# Patient Record
Sex: Female | Born: 1945 | Race: White | Hispanic: No | Marital: Married | Smoking: Former smoker
Health system: Southern US, Community
[De-identification: ages and names within clinical notes are randomized; demographics above are authoritative.]

## PROBLEM LIST (undated history)

## (undated) DIAGNOSIS — E1140 Type 2 diabetes mellitus with diabetic neuropathy, unspecified: Secondary | ICD-10-CM

## (undated) DIAGNOSIS — I4891 Unspecified atrial fibrillation: Secondary | ICD-10-CM

## (undated) DIAGNOSIS — E119 Type 2 diabetes mellitus without complications: Secondary | ICD-10-CM

## (undated) DIAGNOSIS — I10 Essential (primary) hypertension: Secondary | ICD-10-CM

## (undated) DIAGNOSIS — M1990 Unspecified osteoarthritis, unspecified site: Secondary | ICD-10-CM

## (undated) DIAGNOSIS — Z8619 Personal history of other infectious and parasitic diseases: Secondary | ICD-10-CM

## (undated) DIAGNOSIS — B351 Tinea unguium: Secondary | ICD-10-CM

## (undated) DIAGNOSIS — E785 Hyperlipidemia, unspecified: Secondary | ICD-10-CM

## (undated) DIAGNOSIS — J309 Allergic rhinitis, unspecified: Secondary | ICD-10-CM

## (undated) DIAGNOSIS — Z8679 Personal history of other diseases of the circulatory system: Secondary | ICD-10-CM

## (undated) DIAGNOSIS — M109 Gout, unspecified: Secondary | ICD-10-CM

## (undated) DIAGNOSIS — S42409A Unspecified fracture of lower end of unspecified humerus, initial encounter for closed fracture: Secondary | ICD-10-CM

## (undated) DIAGNOSIS — T884XXA Failed or difficult intubation, initial encounter: Secondary | ICD-10-CM

## (undated) DIAGNOSIS — M8700 Idiopathic aseptic necrosis of unspecified bone: Secondary | ICD-10-CM

## (undated) DIAGNOSIS — M810 Age-related osteoporosis without current pathological fracture: Secondary | ICD-10-CM

## (undated) DIAGNOSIS — L859 Epidermal thickening, unspecified: Secondary | ICD-10-CM

## (undated) DIAGNOSIS — I479 Paroxysmal tachycardia, unspecified: Secondary | ICD-10-CM

## (undated) DIAGNOSIS — R011 Cardiac murmur, unspecified: Secondary | ICD-10-CM

## (undated) DIAGNOSIS — Z9181 History of falling: Secondary | ICD-10-CM

## (undated) DIAGNOSIS — J45909 Unspecified asthma, uncomplicated: Secondary | ICD-10-CM

## (undated) DIAGNOSIS — I359 Nonrheumatic aortic valve disorder, unspecified: Secondary | ICD-10-CM

## (undated) DIAGNOSIS — I499 Cardiac arrhythmia, unspecified: Secondary | ICD-10-CM

## (undated) DIAGNOSIS — E0590 Thyrotoxicosis, unspecified without thyrotoxic crisis or storm: Secondary | ICD-10-CM

## (undated) DIAGNOSIS — E039 Hypothyroidism, unspecified: Secondary | ICD-10-CM

## (undated) DIAGNOSIS — S72409A Unspecified fracture of lower end of unspecified femur, initial encounter for closed fracture: Secondary | ICD-10-CM

## (undated) DIAGNOSIS — K219 Gastro-esophageal reflux disease without esophagitis: Secondary | ICD-10-CM

## (undated) HISTORY — DX: Cardiac murmur, unspecified: R01.1

## (undated) HISTORY — DX: Cardiac arrhythmia, unspecified: I49.9

## (undated) HISTORY — PX: TOTAL KNEE ARTHROPLASTY: SHX125

## (undated) HISTORY — PX: ORIF FIBULA FRACTURE: SHX2121

## (undated) HISTORY — DX: Gout, unspecified: M10.9

## (undated) HISTORY — DX: Paroxysmal tachycardia, unspecified: I47.9

## (undated) HISTORY — PX: REPLACEMENT TOTAL KNEE BILATERAL: SUR1225

## (undated) HISTORY — DX: Unspecified fracture of lower end of unspecified femur, initial encounter for closed fracture: S72.409A

## (undated) HISTORY — DX: Personal history of other infectious and parasitic diseases: Z86.19

## (undated) HISTORY — PX: CHOLECYSTECTOMY: SHX55

## (undated) HISTORY — DX: History of falling: Z91.81

## (undated) HISTORY — DX: Gastro-esophageal reflux disease without esophagitis: K21.9

## (undated) HISTORY — DX: Failed or difficult intubation, initial encounter: T88.4XXA

## (undated) HISTORY — DX: Tinea unguium: B35.1

## (undated) HISTORY — DX: Hypothyroidism, unspecified: E03.9

## (undated) HISTORY — DX: Unspecified fracture of lower end of unspecified humerus, initial encounter for closed fracture: S42.409A

## (undated) HISTORY — DX: Type 2 diabetes mellitus without complications: E11.9

## (undated) HISTORY — DX: Type 2 diabetes mellitus with diabetic neuropathy, unspecified: E11.40

## (undated) HISTORY — DX: Unspecified osteoarthritis, unspecified site: M19.90

## (undated) HISTORY — DX: Unspecified asthma, uncomplicated: J45.909

## (undated) HISTORY — DX: Personal history of other diseases of the circulatory system: Z86.79

## (undated) HISTORY — DX: Age-related osteoporosis without current pathological fracture: M81.0

## (undated) HISTORY — DX: Epidermal thickening, unspecified: L85.9

---

## 1973-02-12 HISTORY — PX: THYROIDECTOMY: SHX17

## 1981-02-12 HISTORY — PX: TOTAL ABDOMINAL HYSTERECTOMY W/ BILATERAL SALPINGOOPHORECTOMY: SHX83

## 2005-02-12 HISTORY — PX: JOINT REPLACEMENT: SHX530

## 2008-02-13 HISTORY — PX: FEMUR FRACTURE SURGERY: SHX633

## 2008-07-15 ENCOUNTER — Ambulatory Visit: Payer: Self-pay | Admitting: Internal Medicine

## 2008-07-25 ENCOUNTER — Other Ambulatory Visit: Payer: Self-pay | Admitting: Emergency Medicine

## 2009-10-30 ENCOUNTER — Emergency Department: Payer: Self-pay | Admitting: Unknown Physician Specialty

## 2009-11-01 ENCOUNTER — Ambulatory Visit: Payer: Self-pay | Admitting: Family Medicine

## 2010-03-13 ENCOUNTER — Ambulatory Visit: Payer: Self-pay | Admitting: Internal Medicine

## 2012-01-04 ENCOUNTER — Encounter: Payer: Self-pay | Admitting: Nurse Practitioner

## 2012-01-04 ENCOUNTER — Encounter: Payer: Self-pay | Admitting: Cardiothoracic Surgery

## 2012-01-13 ENCOUNTER — Encounter: Payer: Self-pay | Admitting: Nurse Practitioner

## 2012-01-13 ENCOUNTER — Encounter: Payer: Self-pay | Admitting: Cardiothoracic Surgery

## 2012-05-21 ENCOUNTER — Emergency Department: Payer: Self-pay | Admitting: Emergency Medicine

## 2012-05-27 ENCOUNTER — Ambulatory Visit: Payer: Self-pay | Admitting: General Practice

## 2012-05-27 LAB — BASIC METABOLIC PANEL
BUN: 13 mg/dL (ref 7–18)
Calcium, Total: 9.3 mg/dL (ref 8.5–10.1)
Chloride: 102 mmol/L (ref 98–107)
EGFR (Non-African Amer.): >60
Glucose: 140 mg/dL — ABNORMAL HIGH (ref 65–99)

## 2012-05-27 LAB — CBC
MCHC: 33.7 g/dL (ref 32.0–36.0)
MCV: 95 fL (ref 80–100)
Platelet: 188 10*3/uL (ref 150–440)
RDW: 12.4 % (ref 11.5–14.5)

## 2012-05-28 ENCOUNTER — Ambulatory Visit: Payer: Self-pay | Admitting: General Practice

## 2012-05-28 HISTORY — PX: ORIF HUMERUS FRACTURE: SHX2126

## 2014-03-22 DIAGNOSIS — E119 Type 2 diabetes mellitus without complications: Secondary | ICD-10-CM | POA: Insufficient documentation

## 2014-06-04 NOTE — Op Note (Signed)
PATIENT NAME:  Bethany Joseph, [name] MR#:  [id number] DATE OF BIRTH:  [birth year]  DATE OF PROCEDURE:  05/28/2012  PREOPERATIVE DIAGNOSIS: Fracture of the left distal humerus (supracondylar).   POSTOPERATIVE DIAGNOSIS: Fracture of the left distal humerus (supracondylar).   PROCEDURE PERFORMED: Open reduction and internal fixation of left distal humerus fracture.   SURGEON: Illene LabradorJames P. Angie FavaHooten, Jr., MD   ANESTHESIA: General.   ESTIMATED BLOOD LOSS: 50 mL.   FLUIDS REPLACED: 1500 mL of crystalloid.   DRAINS: None.   TOURNIQUET TIME:  1. 120 minutes.  2.  58 minutes.   IMPLANTS UTILIZED: Synthes 5-hole 3.5 mm LCP distal humerus plate, 3-hole medial 3.5 mm LCP distal humerus plate, six  2.7 mm locking screws, four 3.5 mm locking screws and three 3.5 mm cortex screws.   INDICATIONS FOR SURGERY: The patient is a 69 year old female who slipped and fell at home landing on her left arm and elbow. Radiographs demonstrated an extra-articular supracondylar distal humerus fracture. After discussion of the risks and benefits of surgical intervention, the patient expressed understanding of the risks and benefits and agreed with plans for surgical intervention. 1  PROCEDURE IN DETAIL: The patient was brought into the Operating Room, and after adequate general anesthesia was achieved, the patient was placed in a right lateral decubitus position. An axillary roll was placed, and all bony prominences were well padded. A beanbag  was suctioned so as to provide firm support for the patient. The patient's left arm was cleaned and prepped with alcohol and DuraPrep and draped in the usual sterile fashion. A "timeout" was performed as per usual protocol. The left upper extremity was exsanguinated using an Esmarch, and the tourniquet was inflated to 250 mmHg.  A posterior longitudinal incision was made extending just lateral to the olecranon process. Full-thickness subcutaneous flaps were developed both medially and  laterally. Dissection was carried down along the lateral aspect of the triceps and a para-triceps approach was utilized laterally by elevating triceps off of the posterior cortex. Next, careful dissection was carried out so as to mobilize the ulnar nerve and transpose the nerve. The medial intermuscular septum was incised so as to visualize the medial cortex extending down to the medial condyle. Provisional reduction was performed and maintained using 2 K-wires. Position was confirmed in multiple planes using FluoroScan. A 5-hole 3.5 mm posterolateral LCP plate was positioned and maintained in position using a 3.5 mm screw through the slotted Combi-Hole. Good position was noted. Next, distal fixation was achieved using three 2.7 mm locking screws. Good position was noted in multiple planes using FluoroScan. The proximal portion of the plate was then further stabilized using an additional 3.5 mm cortical screw and 2 additional 3.5 mm locking screws. Next, a 3- hole medial 3.5 mm LCP distal humeral plate was positioned provisionally. At approximately this time the tourniquet was deflated after an initial tourniquet time of 120 minutes. Good hemostasis was appreciated. The medial plate was positioned and a 3.5 mm cortical screw was inserted through the slotted Combi-Hole to maintain provisional fixation. Good position was noted. Distal fixation of the plate was performed using three 2.7 mm locking screws. The tourniquet was reinflated after approximately 30 minutes of downtime. The proximal portion of the medial plate was then further secured using two additional 3.5 mm locking screws. The elbow was placed through a range of motion with good stability appreciated and a good range of motion both with flexion and extension. The wound was irrigated with copious amounts of normal saline  with antibiotic solution using a bulb syringe. The tourniquet was deflated after a second tourniquet time of 15 minutes. Good hemostasis  was noted. A fascial closure over the medial plate prevented contact of the ulnar nerve with the metal plate. The medial and lateral paratricipital incisions were loosely reapproximated using #1 Vicryl. Next, subcutaneous tissue was approximated in layers using first #0 Vicryl, followed #2-0 Vicryl. Skin was closed with skin staples. A sterile dressing was applied after injection of 30 mL of 0.25% Marcaine. A posterior splint was applied. The patient tolerated the procedure well. She was transported to the recovery room in stable condition.  ____________________________ Illene Labrador. Angie Fava., MD jph:cb D: 05/28/2012 19:35:20 ET T: 05/28/2012 20:57:37 ET JOB#: [id number] cc: Fayrene Fearing P. Angie Fava., MD, <Dictator> Illene Labrador Angie Fava MD ELECTRONICALLY SIGNED 05/30/2012 12:37

## 2016-01-25 ENCOUNTER — Ambulatory Visit
Admission: EM | Admit: 2016-01-25 | Discharge: 2016-01-25 | Disposition: A | Discharge status: Home / Self Care | Payer: Medicare Other | Attending: Family Medicine | Admitting: Family Medicine

## 2016-01-25 DIAGNOSIS — L03116 Cellulitis of left lower limb: Secondary | ICD-10-CM

## 2016-01-25 DIAGNOSIS — L97929 Non-pressure chronic ulcer of unspecified part of left lower leg with unspecified severity: Secondary | ICD-10-CM

## 2016-01-25 DIAGNOSIS — I83029 Varicose veins of left lower extremity with ulcer of unspecified site: Secondary | ICD-10-CM

## 2016-01-25 HISTORY — DX: Essential (primary) hypertension: I10

## 2016-01-25 HISTORY — DX: Allergic rhinitis, unspecified: J30.9

## 2016-01-25 HISTORY — DX: Thyrotoxicosis, unspecified without thyrotoxic crisis or storm: E05.90

## 2016-01-25 HISTORY — DX: Nonrheumatic aortic valve disorder, unspecified: I35.9

## 2016-01-25 HISTORY — DX: Idiopathic aseptic necrosis of unspecified bone: M87.00

## 2016-01-25 HISTORY — DX: Unspecified asthma, uncomplicated: J45.909

## 2016-01-25 HISTORY — DX: Hyperlipidemia, unspecified: E78.5

## 2016-01-25 HISTORY — DX: Type 2 diabetes mellitus without complications: E11.9

## 2016-01-25 MED ORDER — CLINDAMYCIN HCL 300 MG PO CAPS
300.0000 mg | ORAL_CAPSULE | Freq: Three times a day (TID) | ORAL | 0 refills | Status: DC
Start: 2016-01-25 — End: 2016-07-19

## 2016-01-25 MED ORDER — SILVER SULFADIAZINE 1 % EX CREA: 1.0000 "application " | TOPICAL_CREAM | Freq: Two times a day (BID) | CUTANEOUS | 0 refills | Status: DC

## 2016-01-25 NOTE — ED Provider Notes (Signed)
[facility]    CSN: [id number] Arrival date & time: 01/25/16  1658     History   Chief Complaint Chief Complaint  Patient presents with  . Skin Ulcer    HPI Bethany Joseph is a 70 y.o. female.   70 yo diabetic female with a c/o 2 weeks h/o left lower leg skin lesion which has been progressively with surrounding erythema, edema and tenderness to palpation. Patient denies any trauma, injury, fevers, chills. States area has had some mild drainage recently.    The history is provided by the patient.    Past Medical History:  Diagnosis Date  . Allergic rhinitis   . Aortic valve disorder   . Asthma   . Avascular necrosis (HCC)   . Diabetes (HCC)   . Hyperlipidemia   . Hypertension   . Hyperthyroidism     There are no active problems to display for this patient.   Past Surgical History:  Procedure Laterality Date  . ABDOMINAL HYSTERECTOMY    . CHOLECYSTECTOMY    . ORIF FIBULA FRACTURE    . ORIF HUMERUS FRACTURE    . REPLACEMENT TOTAL KNEE BILATERAL      OB History    No data available       Home Medications    Prior to Admission medications   Medication Sig Start Date End Date Taking? Authorizing Provider  aspirin 325 MG tablet Take 325 mg by mouth daily.   Yes Historical Provider, MD  cholecalciferol (VITAMIN D) 400 units TABS tablet Take 400 Units by mouth.   Yes Historical Provider, MD  digoxin (LANOXIN) 0.125 MG tablet Take 0.125 mg by mouth daily.   Yes Historical Provider, MD  furosemide (LASIX) 20 MG tablet Take 20 mg by mouth.   Yes Historical Provider, MD  HYDROcodone-acetaminophen (NORCO/VICODIN) 5-325 MG tablet Take 1 tablet by mouth every 6 (six) hours as needed for moderate pain.   Yes Historical Provider, MD  levothyroxine (SYNTHROID, LEVOTHROID) 25 MCG tablet Take 25 mcg by mouth daily before breakfast.   Yes Historical Provider, MD  liothyronine (CYTOMEL) 25 MCG tablet Take by mouth daily.   Yes Historical Provider, MD    metFORMIN (GLUCOPHAGE) 500 MG tablet Take by mouth 2 (two) times daily with a meal.   Yes Historical Provider, MD  vitamin E 1000 UNIT capsule Take 1,000 Units by mouth daily.   Yes Historical Provider, MD  zolpidem (AMBIEN) 10 MG tablet Take 10 mg by mouth at bedtime as needed for sleep.   Yes Historical Provider, MD  clindamycin (CLEOCIN) 300 MG capsule Take 1 capsule (300 mg total) by mouth 3 (three) times daily. 01/25/16   Payton Mccallumrlando Amayiah Gosnell, MD  silver sulfADIAZINE (SILVADENE) 1 % cream Apply 1 application topically 2 (two) times daily. 01/25/16   Payton Mccallumrlando Ebonie Westerlund, MD    Family History History reviewed. No pertinent family history.  Social History Social History  Substance Use Topics  . Smoking status: Never Smoker  . Smokeless tobacco: Never Used  . Alcohol use Yes     Allergies   Codeine; Latex; Neosporin [neomycin-polymyxin-gramicidin]; and Penicillins   Review of Systems Review of Systems   Physical Exam Triage Vital Signs ED Triage Vitals  Enc Vitals Group     BP 01/25/16 1716 (!) 158/72     Pulse Rate 01/25/16 1716 64     Resp 01/25/16 1716 17     Temp 01/25/16 1716 98.2 F (36.8 C)     Temp Source 01/25/16 1716 Oral  SpO2 01/25/16 1716 97 %     Weight 01/25/16 1718 180 lb (81.6 kg)     Height 01/25/16 1718 5\' 7"  (1.702 m)     Head Circumference --      Peak Flow --      Pain Score 01/25/16 1725 4     Pain Loc --      Pain Edu? --      Excl. in GC? --    No data found.   Updated Vital Signs BP (!) 158/72 (BP Location: Left Arm)   Pulse 64   Temp 98.2 F (36.8 C) (Oral)   Resp 17   Ht 5\' 7"  (1.702 m)   Wt 180 lb (81.6 kg)   SpO2 97%   BMI 28.19 kg/m   Visual Acuity Right Eye Distance:   Left Eye Distance:   Bilateral Distance:    Right Eye Near:   Left Eye Near:    Bilateral Near:     Physical Exam  Constitutional: She appears well-developed and well-nourished. No distress.  Skin: She is not diaphoretic.     1cm x 1.5cm superficial  skin ulceration to lateral  left lower extremity (above the ankle) with no drainage, but with surrounding blanchable erythema and tenderness to palpation  Nursing note and vitals reviewed.    UC Treatments / Results  Labs (all labs ordered are listed, but only abnormal results are displayed) Labs Reviewed - No data to display  EKG  EKG Interpretation None       Radiology No results found.  Procedures Procedures (including critical care time)  Medications Ordered in UC Medications - No data to display   Initial Impression / Assessment and Plan / UC Course  I have reviewed the triage vital signs and the nursing notes.  Pertinent labs & imaging results that were available during my care of the patient were reviewed by me and considered in my medical decision making (see chart for details).  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 diagnosis reviewed with patient 2. rx as per orders above; reviewed possible side effects, interactions, risks and benefits  3. Follow-up with PCP within the next 7 days or sooner here (or PCP) prn if symptoms worsen or don't improve   Payton Mccallumrlando Abdullahi Vallone, MD 01/25/16 2144

## 2016-01-25 NOTE — ED Triage Notes (Signed)
Patient has an ulcerated area on her left ankle. Patient states that lesion came up around 2 weeks ago but has recently worsened. Patient states that she saw Dr. Ether GriffinsFowler two weeks ago and she has been using neosporin and elevation. Patient states that the swelling has worsened and this is what has concerned her.

## 2016-03-26 ENCOUNTER — Other Ambulatory Visit (INDEPENDENT_AMBULATORY_CARE_PROVIDER_SITE_OTHER): Payer: Medicare Other

## 2016-03-26 ENCOUNTER — Ambulatory Visit (INDEPENDENT_AMBULATORY_CARE_PROVIDER_SITE_OTHER): Payer: Medicare Other | Admitting: Vascular Surgery

## 2016-03-26 ENCOUNTER — Encounter (INDEPENDENT_AMBULATORY_CARE_PROVIDER_SITE_OTHER): Payer: Self-pay | Admitting: Vascular Surgery

## 2016-03-26 ENCOUNTER — Other Ambulatory Visit (INDEPENDENT_AMBULATORY_CARE_PROVIDER_SITE_OTHER): Payer: Self-pay | Admitting: Vascular Surgery

## 2016-03-26 DIAGNOSIS — I83029 Varicose veins of left lower extremity with ulcer of unspecified site: Secondary | ICD-10-CM

## 2016-03-26 DIAGNOSIS — L97929 Non-pressure chronic ulcer of unspecified part of left lower leg with unspecified severity: Principal | ICD-10-CM

## 2016-03-26 DIAGNOSIS — I872 Venous insufficiency (chronic) (peripheral): Secondary | ICD-10-CM | POA: Insufficient documentation

## 2016-03-26 DIAGNOSIS — L97921 Non-pressure chronic ulcer of unspecified part of left lower leg limited to breakdown of skin: Secondary | ICD-10-CM

## 2016-03-26 DIAGNOSIS — J439 Emphysema, unspecified: Secondary | ICD-10-CM

## 2016-03-26 DIAGNOSIS — E11622 Type 2 diabetes mellitus with other skin ulcer: Secondary | ICD-10-CM

## 2016-03-26 DIAGNOSIS — I83009 Varicose veins of unspecified lower extremity with ulcer of unspecified site: Secondary | ICD-10-CM | POA: Insufficient documentation

## 2016-03-26 DIAGNOSIS — J449 Chronic obstructive pulmonary disease, unspecified: Secondary | ICD-10-CM | POA: Insufficient documentation

## 2016-03-26 DIAGNOSIS — L97909 Non-pressure chronic ulcer of unspecified part of unspecified lower leg with unspecified severity: Secondary | ICD-10-CM

## 2016-03-26 DIAGNOSIS — E119 Type 2 diabetes mellitus without complications: Secondary | ICD-10-CM | POA: Insufficient documentation

## 2016-03-26 DIAGNOSIS — I10 Essential (primary) hypertension: Secondary | ICD-10-CM | POA: Insufficient documentation

## 2016-03-26 NOTE — Progress Notes (Signed)
MRN : [id number]  Bethany Joseph is a 71 y.o. (May 26, 1945) female who presents with chief complaint of  Chief Complaint  Patient presents with  . New Patient (Initial Visit)  .  History of Present Illness: Patient is seen for evaluation of leg pain and swelling associated with left ankle ulceration. The patient first noticed the swelling remotely. The swelling is associated with pain and discoloration. The pain and swelling worsens with prolonged dependency and improves with elevation. The pain is unrelated to activity.  The patient notes that in the morning the legs are better but the leg symptoms worsened throughout the course of the day. The patient has also noted a progressive worsening of the discoloration in the ankle and shin area.   The patient notes that an ulcer has developed acutely without specific trauma and since it occurred it has been very slow to heal.  There is a moderate amount of drainage associated with the open area.  The wound is also very painful.  The patient denies claudication symptoms or rest pain symptoms.  The patient denies DJD and LS spine disease.  The patient has not had any past angiography, interventions or vascular surgery.  Elevation makes the leg symptoms better, dependency makes them much worse. The patient denies any recent changes in medications.  The patient has not been wearing graduated compression.  The patient denies a history of DVT or PE. There is no prior history of phlebitis. There is no history of primary lymphedema.  No history of malignancies. No history of trauma or groin or pelvic surgery. There is no history of radiation treatment to the groin or pelvis   Venous ultrasound of the left leg shows reflux in the small saphenous vein    Current Meds  Medication Sig  . albuterol (PROAIR HFA) 108 (90 Base) MCG/ACT inhaler USE 2 PUFFS TWICE DAILY AS NEEDED  . aspirin 325 MG tablet Take 325 mg by mouth daily.  . Blood Glucose  Monitoring Suppl (GLUCOCOM BLOOD GLUCOSE MONITOR) DEVI Test daily before all meals/snacks and once before bedtime.  . cholecalciferol (VITAMIN D) 400 units TABS tablet Take 400 Units by mouth.  . digoxin (LANOXIN) 0.125 MG tablet Take 0.125 mg by mouth daily.  . furosemide (LASIX) 20 MG tablet Take 20 mg by mouth.  Marland Kitchen HYDROcodone-acetaminophen (NORCO/VICODIN) 5-325 MG tablet Take 1 tablet by mouth every 6 (six) hours as needed for moderate pain.  . Lancets (FREESTYLE) lancets Test daily before all meals/snacks and once before bedtime.  Marland Kitchen levothyroxine (SYNTHROID, LEVOTHROID) 25 MCG tablet Take 25 mcg by mouth daily before breakfast.  . liothyronine (CYTOMEL) 25 MCG tablet Take by mouth daily.  . metFORMIN (GLUCOPHAGE) 500 MG tablet Take by mouth 2 (two) times daily with a meal.  . omeprazole (PRILOSEC) 40 MG capsule TAKE ONE CAPSULE BY MOUTH DAILY  . pravastatin (PRAVACHOL) 20 MG tablet Take 20 mg by mouth.  . raloxifene (EVISTA) 60 MG tablet TAKE ONE (1) TABLET BY MOUTH EVERY DAY  . silver sulfADIAZINE (SILVADENE) 1 % cream Apply 1 application topically 2 (two) times daily.  . vitamin E 1000 UNIT capsule Take 1,000 Units by mouth daily.  Marland Kitchen zolpidem (AMBIEN) 10 MG tablet Take 10 mg by mouth at bedtime as needed for sleep.    Past Medical History:  Diagnosis Date  . Allergic rhinitis   . Aortic valve disorder   . Asthma   . Avascular necrosis (HCC)   . Diabetes (HCC)   . Hyperlipidemia   .  Hypertension   . Hyperthyroidism     Past Surgical History:  Procedure Laterality Date  . ABDOMINAL HYSTERECTOMY    . CHOLECYSTECTOMY    . ORIF FIBULA FRACTURE    . ORIF HUMERUS FRACTURE    . REPLACEMENT TOTAL KNEE BILATERAL      Social History Social History  Substance Use Topics  . Smoking status: Never Smoker  . Smokeless tobacco: Never Used  . Alcohol use Yes    Family History No family history on file. No family history of bleeding/clotting disorders, porphyria or autoimmune  disease  Allergies  Allergen Reactions  . Codeine   . Latex   . Neosporin [Neomycin-Polymyxin-Gramicidin]   . Penicillins      REVIEW OF SYSTEMS (Negative unless checked)  Constitutional: [] Weight loss  [] Fever  [] Chills Cardiac: [] Chest pain   [] Chest pressure   [] Palpitations   [] Shortness of breath when laying flat   [] Shortness of breath with exertion. Vascular:  [] Pain in legs with walking   [] Pain in legs at rest  [] History of DVT   [] Phlebitis   [x] Swelling in legs   [x] Varicose veins   [x] Non-healing ulcers Pulmonary:   [] Uses home oxygen   [] Productive cough   [] Hemoptysis   [] Wheeze  [] COPD   [] Asthma Neurologic:  [] Dizziness   [] Seizures   [] History of stroke   [] History of TIA  [] Aphasia   [] Vissual changes   [] Weakness or numbness in arm   [] Weakness or numbness in leg Musculoskeletal:   [] Joint swelling   [x] Joint pain   [] Low back pain Hematologic:  [] Easy bruising  [] Easy bleeding   [] Hypercoagulable state   [] Anemic Gastrointestinal:  [] Diarrhea   [] Vomiting  [] Gastroesophageal reflux/heartburn   [] Difficulty swallowing. Genitourinary:  [] Chronic kidney disease   [] Difficult urination  [] Frequent urination   [] Blood in urine Skin:  [] Rashes   [] Ulcers  Psychological:  [] History of anxiety   []  History of major depression.  Physical Examination  Vitals:   03/26/16 1149  BP: (!) 149/68  Pulse: (!) 58  Resp: 16  Weight: 179 lb 12.8 oz (81.6 kg)  Height: 5\' 7"  (1.702 m)   Body mass index is 28.16 kg/m. Gen: WD/WN, NAD Head: [place]/AT, No temporalis wasting.  Ear/Nose/Throat: Hearing grossly intact, nares w/o erythema or drainage, poor dentition Eyes: PER, EOMI, sclera nonicteric.  Neck: Supple, no masses.  No bruit or JVD.  Pulmonary:  Good air movement, clear to auscultation bilaterally, no use of accessory muscles.  Cardiac: RRR, normal S1, S2, no Murmurs. Vascular: left leg with 2+ edema and severe venous stasis dermatitis; ulcer noted left lateral ankle  noninfected moderate granulation present Vessel Right Left  Radial Palpable Palpable  Ulnar Palpable Palpable  Brachial Palpable Palpable  Carotid Palpable Palpable  Femoral Palpable Palpable  Popliteal Palpable Palpable  PT Palpable Palpable  DP Palpable Palpable   Gastrointestinal: soft, non-distended. No guarding/no peritoneal signs.  Musculoskeletal: M/S 5/5 throughout.  No deformity or atrophy.  Neurologic: CN 2-12 intact. Pain and light touch intact in extremities.  Symmetrical.  Speech is fluent. Motor exam as listed above. Psychiatric: Judgment intact, Mood & affect appropriate for pt's clinical situation. Dermatologic: No rashes or ulcers noted.  No changes consistent with cellulitis. Lymph : No Cervical lymphadenopathy, no lichenification or skin changes of chronic lymphedema.  CBC Lab Results  Component Value Date   WBC 5.7 05/27/2012   HGB 13.8 05/27/2012   HCT 40.9 05/27/2012   MCV 95 05/27/2012   PLT 188 05/27/2012  BMET    Component Value Date/Time   NA 136 05/27/2012 0917   K 4.0 05/27/2012 0917   CL 102 05/27/2012 0917   CO2 28 05/27/2012 0917   GLUCOSE 140 (H) 05/27/2012 0917   BUN 13 05/27/2012 0917   CREATININE 0.58 (L) 05/27/2012 0917   CALCIUM 9.3 05/27/2012 0917   GFRNONAA >60 05/27/2012 0917   GFRAA >60 05/27/2012 0917   CrCl cannot be calculated (Patient's most recent lab result is older than the maximum 21 days allowed.).  COAG No results found for: INR, PROTIME  Radiology No results found.  Assessment/Plan 1. 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 Patient will continue  wearing graduated compression stockings class 1 on a daily basis, beginning first thing in the morning and removing them in the evening.    In addition, behavioral modification including elevation during the day was again discussed  and this will continue.  The patient has utilized over the counter pain medications and has been exercising.  However, at this time conservative therapy has not alleviated the patient's symptoms of leg pain and swelling  Recommend: laser ablation of the left small saphenous vein to eliminate the symptoms of pain and swelling of the lower extremities caused by the severe superficial venous reflux disease.   2. Chronic venous insufficiency No surgery or intervention at this point in time.    I have had a long discussion with the patient regarding venous insufficiency and why it  causes symptoms. I have discussed with the patient the chronic skin changes that accompany venous insufficiency and the long term sequela such as infection and ulceration.  Patient will begin wearing graduated compression stockings class 1 (20-30 mmHg) or compression wraps on a daily basis a prescription was given. The patient will put the stockings on first thing in the morning and removing them in the evening. The patient is instructed specifically not to sleep in the stockings.    In addition, behavioral modification including several periods of elevation of the lower extremities during the day will be continued. I have demonstrated that proper elevation is a position with the ankles at heart level.  The patient is instructed to begin routine exercise, especially walking on a daily basis  Following the review of the ultrasound the patient will follow up in 2-3 months to reassess the degree of swelling and the control that graduated compression stockings or compression wraps  is offering.   The patient can be assessed for a Lymph Pump at that time  3. Essential hypertension Continue antihypertensive medications as already ordered, these medications have been reviewed and there are no changes at this time.   4. Type 2 diabetes mellitus with other skin ulcer, without long-term current use of insulin (HCC) Continue  hypoglycemic medications as already ordered, these medications have been reviewed and there are no changes at this time.  Hgb A1C to be monitored as already arranged by primary service   5. Pulmonary emphysema, unspecified emphysema type (HCC) Continue pulmonary medications and aerosols as already ordered, these medications have been reviewed and there are no changes at this time.      Levora Dredge, MD  03/26/2016 9:52 PM

## 2016-04-16 ENCOUNTER — Encounter (INDEPENDENT_AMBULATORY_CARE_PROVIDER_SITE_OTHER): Payer: Self-pay | Admitting: Vascular Surgery

## 2016-04-16 ENCOUNTER — Ambulatory Visit (INDEPENDENT_AMBULATORY_CARE_PROVIDER_SITE_OTHER): Payer: Medicare Other | Admitting: Vascular Surgery

## 2016-04-16 VITALS — BP 161/67 | HR 65 | Resp 16 | Wt 180.0 lb

## 2016-04-16 DIAGNOSIS — I872 Venous insufficiency (chronic) (peripheral): Secondary | ICD-10-CM

## 2016-04-16 DIAGNOSIS — J439 Emphysema, unspecified: Secondary | ICD-10-CM

## 2016-04-16 DIAGNOSIS — I83029 Varicose veins of left lower extremity with ulcer of unspecified site: Secondary | ICD-10-CM | POA: Diagnosis not present

## 2016-04-16 DIAGNOSIS — I10 Essential (primary) hypertension: Secondary | ICD-10-CM

## 2016-04-16 DIAGNOSIS — L97929 Non-pressure chronic ulcer of unspecified part of left lower leg with unspecified severity: Principal | ICD-10-CM

## 2016-04-17 NOTE — Progress Notes (Signed)
MRN : [id number]  Bethany SpinnerKarla Joseph is a 71 y.o. (03/06/1945) female who presents with chief complaint of  Chief Complaint  Patient presents with  . Follow-up  .  History of Present Illness:The patient returns for followup evaluation 3 months after the initial visit. The patient continues to have pain in the lower extremities with dependency. The pain is lessened with elevation. Graduated compression stockings, Class I (20-30 mmHg), have been worn but the stockings do not eliminate the leg pain. Over-the-counter analgesics do not improve the symptoms. The degree of discomfort continues to interfere with daily activities. The patient notes the pain in the legs is causing problems with daily exercise, at the workplace and even with household activities and maintenance such as standing in the kitchen preparing meals and doing dishes.   Venous ultrasound shows normal deep venous system, no evidence of acute or chronic DVT.  Superficial reflux is present in the left small saphenous vein  Current Meds  Medication Sig  . albuterol (PROAIR HFA) 108 (90 Base) MCG/ACT inhaler USE 2 PUFFS TWICE DAILY AS NEEDED  . aspirin 325 MG tablet Take 325 mg by mouth daily.  . Blood Glucose Monitoring Suppl (GLUCOCOM BLOOD GLUCOSE MONITOR) DEVI Test daily before all meals/snacks and once before bedtime.  . cholecalciferol (VITAMIN D) 400 units TABS tablet Take 400 Units by mouth.  . clindamycin (CLEOCIN) 300 MG capsule Take 1 capsule (300 mg total) by mouth 3 (three) times daily.  . digoxin (LANOXIN) 0.125 MG tablet Take 0.125 mg by mouth daily.  . furosemide (LASIX) 20 MG tablet Take 20 mg by mouth.  Marland Kitchen. HYDROcodone-acetaminophen (NORCO/VICODIN) 5-325 MG tablet Take 1 tablet by mouth every 6 (six) hours as needed for moderate pain.  . Lancets (FREESTYLE) lancets Test daily before all meals/snacks and once before bedtime.  Marland Kitchen. levothyroxine (SYNTHROID, LEVOTHROID) 25 MCG tablet Take 25 mcg by mouth daily before  breakfast.  . liothyronine (CYTOMEL) 25 MCG tablet Take by mouth daily.  . metFORMIN (GLUCOPHAGE) 500 MG tablet Take by mouth 2 (two) times daily with a meal.  . omeprazole (PRILOSEC) 40 MG capsule TAKE ONE CAPSULE BY MOUTH DAILY  . pravastatin (PRAVACHOL) 20 MG tablet Take 20 mg by mouth.  . raloxifene (EVISTA) 60 MG tablet TAKE ONE (1) TABLET BY MOUTH EVERY DAY  . silver sulfADIAZINE (SILVADENE) 1 % cream Apply 1 application topically 2 (two) times daily.  . vitamin E 1000 UNIT capsule Take 1,000 Units by mouth daily.  Marland Kitchen. zolpidem (AMBIEN) 10 MG tablet Take 10 mg by mouth at bedtime as needed for sleep.    Past Medical History:  Diagnosis Date  . Allergic rhinitis   . Aortic valve disorder   . Asthma   . Avascular necrosis (HCC)   . Diabetes (HCC)   . Hyperlipidemia   . Hypertension   . Hyperthyroidism     Past Surgical History:  Procedure Laterality Date  . ABDOMINAL HYSTERECTOMY    . CHOLECYSTECTOMY    . ORIF FIBULA FRACTURE    . ORIF HUMERUS FRACTURE    . REPLACEMENT TOTAL KNEE BILATERAL      Social History Social History  Substance Use Topics  . Smoking status: Never Smoker  . Smokeless tobacco: Never Used  . Alcohol use Yes    Family History No family history on file. No family history of bleeding/clotting disorders, porphyria or autoimmune disease   Allergies  Allergen Reactions  . Codeine   . Latex   . Neosporin [Neomycin-Polymyxin-Gramicidin]   .  Penicillins      REVIEW OF SYSTEMS (Negative unless checked)  Constitutional: [] Weight loss  [] Fever  [] Chills Cardiac: [] Chest pain   [] Chest pressure   [] Palpitations   [] Shortness of breath when laying flat   [] Shortness of breath with exertion. Vascular:  [] Pain in legs with walking   [x] Pain in legs with standing  [] History of DVT   [] Phlebitis   [x] Swelling in legs   [x] Varicose veins   [] Non-healing ulcers Pulmonary:   [] Uses home oxygen   [] Productive cough   [] Hemoptysis   [] Wheeze  [] COPD    [] Asthma Neurologic:  [] Dizziness   [] Seizures   [] History of stroke   [] History of TIA  [] Aphasia   [] Vissual changes   [] Weakness or numbness in arm   [] Weakness or numbness in leg Musculoskeletal:   [] Joint swelling   [] Joint pain   [] Low back pain Hematologic:  [] Easy bruising  [] Easy bleeding   [] Hypercoagulable state   [] Anemic Gastrointestinal:  [] Diarrhea   [] Vomiting  [] Gastroesophageal reflux/heartburn   [] Difficulty swallowing. Genitourinary:  [] Chronic kidney disease   [] Difficult urination  [] Frequent urination   [] Blood in urine Skin:  [] Rashes   [] Ulcers  Psychological:  [] History of anxiety   []  History of major depression.  Physical Examination  Vitals:   04/16/16 1005  BP: (!) 161/67  Pulse: 65  Resp: 16  Weight: 81.6 kg (180 lb)   Body mass index is 28.19 kg/m. Gen: WD/WN, NAD Head: [place]/AT, No temporalis wasting.  Ear/Nose/Throat: Hearing grossly intact, nares w/o erythema or drainage, poor dentition Eyes: PER, EOMI, sclera nonicteric.  Neck: Supple, no masses.  No bruit or JVD.  Pulmonary:  Good air movement, clear to auscultation bilaterally, no use of accessory muscles.  Cardiac: RRR, normal S1, S2, no Murmurs. Vascular: 2-3+ edema of the left leg with moderate venous changes of the left leg.  >46mm diffuse extensive varicose veins of the left leg Vessel Right Left  Radial Palpable Palpable  Ulnar Palpable Palpable  Brachial Palpable Palpable  Carotid Palpable Palpable  Femoral Palpable Palpable  Popliteal Palpable Palpable  PT Palpable Palpable  DP Palpable Palpable   Gastrointestinal: soft, non-distended. No guarding/no peritoneal signs.  Musculoskeletal: M/S 5/5 throughout.  No deformity or atrophy.  Neurologic: CN 2-12 intact. Pain and light touch intact in extremities.  Symmetrical.  Speech is fluent. Motor exam as listed above. Psychiatric: Judgment intact, Mood & affect appropriate for pt's clinical situation. Dermatologic: Venous stasis dermatitis  without ulcers present on the left.  No changes consistent with cellulitis. Lymph : No Cervical lymphadenopathy, no lichenification or skin changes of chronic lymphedema.  CBC Lab Results  Component Value Date   WBC 5.7 05/27/2012   HGB 13.8 05/27/2012   HCT 40.9 05/27/2012   MCV 95 05/27/2012   PLT 188 05/27/2012    BMET    Component Value Date/Time   NA 136 05/27/2012 0917   K 4.0 05/27/2012 0917   CL 102 05/27/2012 0917   CO2 28 05/27/2012 0917   GLUCOSE 140 (H) 05/27/2012 0917   BUN 13 05/27/2012 0917   CREATININE 0.58 (L) 05/27/2012 0917   CALCIUM 9.3 05/27/2012 0917   GFRNONAA >60 05/27/2012 0917   GFRAA >60 05/27/2012 0917   CrCl cannot be calculated (Patient's most recent lab result is older than the maximum 21 days allowed.).  COAG No results found for: INR, PROTIME  Radiology No results found.  Assessment/Plan 1. 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 Patient will continue  wearing graduated compression stockings class 1 on a daily basis, beginning first thing in the morning and removing them in the evening.    In addition, behavioral modification including elevation during the day was again discussed and this will continue.  The patient has utilized over the counter pain medications and has been exercising.  However, at this time conservative therapy has not alleviated the patient's symptoms of leg pain and swelling  Recommend: laser ablation of the left small saphenous vein to eliminate the symptoms of pain and swelling of the lower extremities caused by the severe superficial venous reflux disease.   2. Chronic venous insufficiency No surgery or intervention at this point in time.    I have had a long discussion with the patient regarding venous insufficiency and why it  causes symptoms. I have discussed with the  patient the chronic skin changes that accompany venous insufficiency and the long term sequela such as infection and ulceration.  Patient will begin wearing graduated compression stockings class 1 (20-30 mmHg) or compression wraps on a daily basis a prescription was given. The patient will put the stockings on first thing in the morning and removing them in the evening. The patient is instructed specifically not to sleep in the stockings.    In addition, behavioral modification including several periods of elevation of the lower extremities during the day will be continued. I have demonstrated that proper elevation is a position with the ankles at heart level.  The patient is instructed to begin routine exercise, especially walking on a daily basis  Following the review of the ultrasound the patient will follow up in 2-3 months to reassess the degree of swelling and the control that graduated compression stockings or compression wraps  is offering.   The patient can be assessed for a Lymph Pump at that time  3. Essential hypertension Continue antihypertensive medications as already ordered, these medications have been reviewed and there are no changes at this time.   4. Pulmonary emphysema, unspecified emphysema type (HCC) Continue pulmonary medications and aerosols as already ordered, these medications have been reviewed and there are no changes at this time.    Levora Dredge, MD  04/17/2016 8:28 AM

## 2016-05-31 ENCOUNTER — Ambulatory Visit (INDEPENDENT_AMBULATORY_CARE_PROVIDER_SITE_OTHER): Payer: Medicare Other | Admitting: Vascular Surgery

## 2016-05-31 ENCOUNTER — Encounter (INDEPENDENT_AMBULATORY_CARE_PROVIDER_SITE_OTHER): Payer: Self-pay | Admitting: Vascular Surgery

## 2016-05-31 VITALS — BP 143/7 | HR 62 | Resp 16 | Wt 174.0 lb

## 2016-05-31 DIAGNOSIS — I872 Venous insufficiency (chronic) (peripheral): Secondary | ICD-10-CM

## 2016-05-31 DIAGNOSIS — I83028 Varicose veins of left lower extremity with ulcer other part of lower leg: Secondary | ICD-10-CM

## 2016-05-31 DIAGNOSIS — L97929 Non-pressure chronic ulcer of unspecified part of left lower leg with unspecified severity: Secondary | ICD-10-CM | POA: Diagnosis not present

## 2016-05-31 DIAGNOSIS — I83029 Varicose veins of left lower extremity with ulcer of unspecified site: Secondary | ICD-10-CM

## 2016-05-31 NOTE — Progress Notes (Signed)
    MRN : [id number]  Julian Askin is a 70 y.o. (03-27-1945) female who presents with chief complaint of painful varicose veins of the left leg.    The patient's left lower extremity was sterilely prepped and draped.  The ultrasound machine was used to visualize the small saphenous vein throughout its course.  A segment of the small saphenous vein above the ankle was selected for access.  The saphenous vein was accessed without difficulty using ultrasound guidance with a micropuncture needle.   An 0.018  wire was placed beyond the saphenofemoral junction through the sheath and the microneedle was removed.  The 65 cm sheath was then placed over the wire and the wire and dilator were removed.  The laser fiber was placed through the sheath and its tip was placed approximately 2 cm below the saphenofemoral junction.  Tumescent anesthesia was then created with a dilute lidocaine solution.  Laser energy was then delivered with constant withdrawal of the sheath and laser fiber.  Approximately 907 Joules of energy were delivered over a length of 22 cm.  Sterile dressings were placed.  The patient tolerated the procedure well without complications.

## 2016-06-04 ENCOUNTER — Ambulatory Visit (INDEPENDENT_AMBULATORY_CARE_PROVIDER_SITE_OTHER): Payer: Medicare Other

## 2016-06-04 DIAGNOSIS — I83029 Varicose veins of left lower extremity with ulcer of unspecified site: Secondary | ICD-10-CM | POA: Diagnosis not present

## 2016-06-04 DIAGNOSIS — L97929 Non-pressure chronic ulcer of unspecified part of left lower leg with unspecified severity: Secondary | ICD-10-CM

## 2016-06-25 ENCOUNTER — Encounter (INDEPENDENT_AMBULATORY_CARE_PROVIDER_SITE_OTHER): Payer: Self-pay | Admitting: Vascular Surgery

## 2016-06-25 ENCOUNTER — Ambulatory Visit (INDEPENDENT_AMBULATORY_CARE_PROVIDER_SITE_OTHER): Payer: Medicare Other | Admitting: Vascular Surgery

## 2016-06-25 VITALS — BP 143/78 | HR 16 | Resp 16 | Ht 68.0 in | Wt 170.0 lb

## 2016-06-25 DIAGNOSIS — I872 Venous insufficiency (chronic) (peripheral): Secondary | ICD-10-CM

## 2016-06-25 DIAGNOSIS — J439 Emphysema, unspecified: Secondary | ICD-10-CM | POA: Diagnosis not present

## 2016-06-25 DIAGNOSIS — M79604 Pain in right leg: Secondary | ICD-10-CM

## 2016-06-25 DIAGNOSIS — L97929 Non-pressure chronic ulcer of unspecified part of left lower leg with unspecified severity: Secondary | ICD-10-CM | POA: Diagnosis not present

## 2016-06-25 DIAGNOSIS — M79606 Pain in leg, unspecified: Secondary | ICD-10-CM | POA: Insufficient documentation

## 2016-06-25 DIAGNOSIS — E11622 Type 2 diabetes mellitus with other skin ulcer: Secondary | ICD-10-CM

## 2016-06-25 DIAGNOSIS — I83029 Varicose veins of left lower extremity with ulcer of unspecified site: Secondary | ICD-10-CM | POA: Diagnosis not present

## 2016-06-25 DIAGNOSIS — I10 Essential (primary) hypertension: Secondary | ICD-10-CM

## 2016-06-25 NOTE — Progress Notes (Signed)
MRN : [id number]  Bethany Joseph is a 71 y.o. (02/23/1945) female who presents with chief complaint of  Chief Complaint  Patient presents with  . Follow-up  .  History of Present Illness: The patient returns to the office for followup status post laser ablation of the left small saphenous vein on 05/31/2016. The patient notes multiple residual varicosities bilaterally which continued to hurt with dependent positions and remained tender to palpation. The patient's swelling is unchanged from preoperative status. The patient continues to wear graduated compression stockings on a daily basis but these are not eliminating the pain and discomfort. The patient continues to use over-the-counter anti-inflammatory medications to treat the pain and related symptoms but this has not given the patient relief. The patient notes the pain in the lower extremities is causing problems with daily exercise, problems at work and even with household activities such as preparing meals and doing dishes.  The patient is otherwise done well and there have been no complications related to the laser procedure or interval changes in the patient's overall   Venous ultrasound post laser shows successful laser ablation of the left small saphenous vein, no DVT identified.  The patient also is noting severe shooting pains in the right leg particularly with laying down. Patient notes the pain is variable and not always associated with activity.  The pain is somewhat consistent day to day occurring on most days. The patient notes the pain also occurs with standing and routinely seems worse as the day wears on. The pain has been progressive over the past several years. The patient states these symptoms are causing  a profound negative impact on quality of life and daily activities.  The patient denies rest pain or dangling of an extremity off the side of the bed during the night for relief. No open wounds or sores at this time. No  prior interventions or surgeries.  There is a  history of back problems and DJD of the lumbar and sacral spine.      Current Meds  Medication Sig  . albuterol (PROAIR HFA) 108 (90 Base) MCG/ACT inhaler USE 2 PUFFS TWICE DAILY AS NEEDED  . aspirin 325 MG tablet Take 325 mg by mouth daily.  . Blood Glucose Monitoring Suppl (GLUCOCOM BLOOD GLUCOSE MONITOR) DEVI Test daily before all meals/snacks and once before bedtime.  . cholecalciferol (VITAMIN D) 400 units TABS tablet Take 400 Units by mouth.  . digoxin (LANOXIN) 0.125 MG tablet Take 0.125 mg by mouth daily.  . furosemide (LASIX) 20 MG tablet Take 20 mg by mouth.  Marland Kitchen HYDROcodone-acetaminophen (NORCO/VICODIN) 5-325 MG tablet Take 1 tablet by mouth every 6 (six) hours as needed for moderate pain.  . Lancets (FREESTYLE) lancets Test daily before all meals/snacks and once before bedtime.  Marland Kitchen levothyroxine (SYNTHROID, LEVOTHROID) 25 MCG tablet Take 25 mcg by mouth daily before breakfast.  . liothyronine (CYTOMEL) 25 MCG tablet Take by mouth daily.  . metFORMIN (GLUCOPHAGE) 500 MG tablet Take by mouth 2 (two) times daily with a meal.  . omeprazole (PRILOSEC) 40 MG capsule TAKE ONE CAPSULE BY MOUTH DAILY  . pravastatin (PRAVACHOL) 20 MG tablet Take 20 mg by mouth.  . raloxifene (EVISTA) 60 MG tablet TAKE ONE (1) TABLET BY MOUTH EVERY DAY  . silver sulfADIAZINE (SILVADENE) 1 % cream Apply 1 application topically 2 (two) times daily.  . vitamin E 1000 UNIT capsule Take 1,000 Units by mouth daily.  Marland Kitchen zolpidem (AMBIEN) 10 MG tablet Take 10 mg  by mouth at bedtime as needed for sleep.    Past Medical History:  Diagnosis Date  . Allergic rhinitis   . Aortic valve disorder   . Asthma   . Avascular necrosis (HCC)   . Diabetes (HCC)   . Hyperlipidemia   . Hypertension   . Hyperthyroidism     Past Surgical History:  Procedure Laterality Date  . ABDOMINAL HYSTERECTOMY    . CHOLECYSTECTOMY    . ORIF FIBULA FRACTURE    . ORIF HUMERUS FRACTURE     . REPLACEMENT TOTAL KNEE BILATERAL      Social History Social History  Substance Use Topics  . Smoking status: Never Smoker  . Smokeless tobacco: Never Used  . Alcohol use Yes    Family History No family history on file.  Allergies  Allergen Reactions  . Codeine   . Latex   . Neosporin [Neomycin-Polymyxin-Gramicidin]   . Penicillins      REVIEW OF SYSTEMS (Negative unless checked)  Constitutional: [] Weight loss  [] Fever  [] Chills Cardiac: [] Chest pain   [] Chest pressure   [] Palpitations   [] Shortness of breath when laying flat   [x] Shortness of breath with exertion. Vascular:  [] Pain in legs with walking   [x] Pain in legs at rest  [] History of DVT   [] Phlebitis   [x] Swelling in legs   [x] Varicose veins   [] Non-healing ulcers Pulmonary:   [] Uses home oxygen   [] Productive cough   [] Hemoptysis   [] Wheeze  [x] COPD   [] Asthma Neurologic:  [] Dizziness   [] Seizures   [] History of stroke   [] History of TIA  [] Aphasia   [] Vissual changes   [] Weakness or numbness in arm   [] Weakness or numbness in leg Musculoskeletal:   [] Joint swelling   [] Joint pain   [] Low back pain Hematologic:  [] Easy bruising  [] Easy bleeding   [] Hypercoagulable state   [] Anemic Gastrointestinal:  [] Diarrhea   [] Vomiting  [] Gastroesophageal reflux/heartburn   [] Difficulty swallowing. Genitourinary:  [] Chronic kidney disease   [] Difficult urination  [] Frequent urination   [] Blood in urine Skin:  [] Rashes   [x] Ulcers  Psychological:  [] History of anxiety   []  History of major depression.  Physical Examination  Vitals:   06/25/16 1040  BP: (!) 143/78  Pulse: (!) 16  Resp: 16  Weight: 170 lb (77.1 kg)  Height: 5\' 8"  (1.727 m)   Body mass index is 25.85 kg/m. Gen: WD/WN, NAD Head: [place]/AT, No temporalis wasting.  Ear/Nose/Throat: Hearing grossly intact, nares w/o erythema or drainage Eyes: PER, EOMI, sclera nonicteric.  Neck: Supple, no large masses.   Pulmonary:  Good air movement, no audible wheezing  bilaterally, no use of accessory muscles.  Cardiac: RRR, no JVD Vascular: 2-3+ edema of the left leg with severe venous changes of the left leg.  Venous ulcer noted in the lateral ankle area on the left, noninfected smaller than before Vessel Right Left  Radial Palpable Palpable  PT Palpable Palpable  DP Palpable Palpable  Gastrointestinal: Non-distended. No guarding/no peritoneal signs.  Musculoskeletal: M/S 5/5 throughout.  No deformity or atrophy.  Neurologic: CN 2-12 intact. Symmetrical.  Speech is fluent. Motor exam as listed above. Psychiatric: Judgment intact, Mood & affect appropriate for pt's clinical situation. Dermatologic: Venous stasis dermatitis with ulcers present on the left.  No changes consistent with cellulitis. Lymph : No lichenification or skin changes of chronic lymphedema.  CBC Lab Results  Component Value Date   WBC 5.7 05/27/2012   HGB 13.8 05/27/2012   HCT 40.9 05/27/2012  MCV 95 05/27/2012   PLT 188 05/27/2012    BMET    Component Value Date/Time   NA 136 05/27/2012 0917   K 4.0 05/27/2012 0917   CL 102 05/27/2012 0917   CO2 28 05/27/2012 0917   GLUCOSE 140 (H) 05/27/2012 0917   BUN 13 05/27/2012 0917   CREATININE 0.58 (L) 05/27/2012 0917   CALCIUM 9.3 05/27/2012 0917   GFRNONAA >60 05/27/2012 0917   GFRAA >60 05/27/2012 0917   CrCl cannot be calculated (Patient's most recent lab result is older than the maximum 21 days allowed.).  COAG No results found for: INR, PROTIME  Radiology No results found.  Assessment/Plan 1. 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  Patient should undergo injection sclerotherapy to treat the residual varicosities.  The risks, benefits and alternative therapies were  reviewed in detail with the patient.  All questions were answered.  The patient agrees to proceed with sclerotherapy at their convenience.  The patient will continue wearing the graduated compression stockings and using the over-the-counter pain medications to treat her symptoms.   2. Chronic venous insufficiency See #1  3. Pain of right lower extremity Recommend:  I do not find evidence of Vascular pathology that would explain the patient's symptoms.  She has strongly palpable pedal pulses  The patient has atypical pain symptoms for vascular disease  Noninvasive studies including venous ultrasound of the legs do not identify vascular problems  The patient should continue walking and begin a more formal exercise program. The patient should continue his antiplatelet therapy and aggressive treatment of the lipid abnormalities. The patient should begin wearing graduated compression socks 15-20 mmHg strength to control her mild edema.  Further work-up of her right lower extremity pain is deferred to the primary service as her right leg issues are likely musculoskeletal     4. Type 2 diabetes mellitus with other skin ulcer, without long-term current use of insulin (HCC) Continue hypoglycemic medications as already ordered, these medications have been reviewed and there are no changes at this time.  Hgb A1C to be monitored as already arranged by primary service   5. Pulmonary emphysema, unspecified emphysema type (HCC) Continue pulmonary medications and aerosols as already ordered, these medications have been reviewed and there are no changes at this time.   6. Essential hypertension Continue antihypertensive medications as already ordered, these medications have been reviewed and there are no changes at this time.      Levora Dredge, MD  06/25/2016 11:10 AM

## 2016-07-16 ENCOUNTER — Encounter (INDEPENDENT_AMBULATORY_CARE_PROVIDER_SITE_OTHER): Payer: Self-pay | Admitting: Vascular Surgery

## 2016-07-16 ENCOUNTER — Ambulatory Visit (INDEPENDENT_AMBULATORY_CARE_PROVIDER_SITE_OTHER): Payer: Medicare Other | Admitting: Vascular Surgery

## 2016-07-16 VITALS — BP 142/68 | HR 61 | Resp 16 | Wt 168.0 lb

## 2016-07-16 DIAGNOSIS — L97929 Non-pressure chronic ulcer of unspecified part of left lower leg with unspecified severity: Secondary | ICD-10-CM

## 2016-07-16 DIAGNOSIS — I83028 Varicose veins of left lower extremity with ulcer other part of lower leg: Secondary | ICD-10-CM | POA: Diagnosis not present

## 2016-07-16 DIAGNOSIS — I872 Venous insufficiency (chronic) (peripheral): Secondary | ICD-10-CM

## 2016-07-16 DIAGNOSIS — I83029 Varicose veins of left lower extremity with ulcer of unspecified site: Secondary | ICD-10-CM

## 2016-07-16 NOTE — Progress Notes (Signed)
    MRN : [id number]  Bethany Joseph is a 71 y.o. (07/03/1945) female who presents with chief complaint of painful varicose veins.   Procedure:  Sclerotherapy using hypertonic saline mixed with 1% Lidocaine was performed on the left  lower extremity.  Compression wraps were placed.  The patient tolerated the procedure well.  Plan:  Follow up as arranged

## 2016-07-19 ENCOUNTER — Emergency Department: Payer: Medicare Other

## 2016-07-19 ENCOUNTER — Encounter: Payer: Self-pay | Admitting: Intensive Care

## 2016-07-19 ENCOUNTER — Observation Stay
Admission: EM | Admit: 2016-07-19 | Discharge: 2016-07-20 | Disposition: A | Discharge status: Home / Self Care | Payer: Medicare Other | Attending: Internal Medicine | Admitting: Internal Medicine

## 2016-07-19 DIAGNOSIS — I359 Nonrheumatic aortic valve disorder, unspecified: Secondary | ICD-10-CM | POA: Diagnosis not present

## 2016-07-19 DIAGNOSIS — Z96653 Presence of artificial knee joint, bilateral: Secondary | ICD-10-CM | POA: Diagnosis not present

## 2016-07-19 DIAGNOSIS — W010XXA Fall on same level from slipping, tripping and stumbling without subsequent striking against object, initial encounter: Secondary | ICD-10-CM | POA: Insufficient documentation

## 2016-07-19 DIAGNOSIS — I10 Essential (primary) hypertension: Secondary | ICD-10-CM | POA: Insufficient documentation

## 2016-07-19 DIAGNOSIS — Z79899 Other long term (current) drug therapy: Secondary | ICD-10-CM | POA: Insufficient documentation

## 2016-07-19 DIAGNOSIS — Z888 Allergy status to other drugs, medicaments and biological substances status: Secondary | ICD-10-CM | POA: Diagnosis not present

## 2016-07-19 DIAGNOSIS — M79641 Pain in right hand: Secondary | ICD-10-CM | POA: Diagnosis not present

## 2016-07-19 DIAGNOSIS — Z87891 Personal history of nicotine dependence: Secondary | ICD-10-CM | POA: Diagnosis not present

## 2016-07-19 DIAGNOSIS — I872 Venous insufficiency (chronic) (peripheral): Secondary | ICD-10-CM | POA: Diagnosis not present

## 2016-07-19 DIAGNOSIS — R296 Repeated falls: Secondary | ICD-10-CM | POA: Insufficient documentation

## 2016-07-19 DIAGNOSIS — J449 Chronic obstructive pulmonary disease, unspecified: Secondary | ICD-10-CM | POA: Diagnosis not present

## 2016-07-19 DIAGNOSIS — Z8781 Personal history of (healed) traumatic fracture: Secondary | ICD-10-CM | POA: Insufficient documentation

## 2016-07-19 DIAGNOSIS — M8588 Other specified disorders of bone density and structure, other site: Secondary | ICD-10-CM | POA: Diagnosis not present

## 2016-07-19 DIAGNOSIS — Y998 Other external cause status: Secondary | ICD-10-CM | POA: Insufficient documentation

## 2016-07-19 DIAGNOSIS — M19041 Primary osteoarthritis, right hand: Secondary | ICD-10-CM | POA: Insufficient documentation

## 2016-07-19 DIAGNOSIS — M25422 Effusion, left elbow: Secondary | ICD-10-CM | POA: Insufficient documentation

## 2016-07-19 DIAGNOSIS — Z885 Allergy status to narcotic agent status: Secondary | ICD-10-CM | POA: Diagnosis not present

## 2016-07-19 DIAGNOSIS — Y93E1 Activity, personal bathing and showering: Secondary | ICD-10-CM | POA: Diagnosis not present

## 2016-07-19 DIAGNOSIS — Z90710 Acquired absence of both cervix and uterus: Secondary | ICD-10-CM | POA: Insufficient documentation

## 2016-07-19 DIAGNOSIS — I4891 Unspecified atrial fibrillation: Secondary | ICD-10-CM | POA: Insufficient documentation

## 2016-07-19 DIAGNOSIS — S6000XA Contusion of unspecified finger without damage to nail, initial encounter: Secondary | ICD-10-CM

## 2016-07-19 DIAGNOSIS — M1990 Unspecified osteoarthritis, unspecified site: Secondary | ICD-10-CM | POA: Insufficient documentation

## 2016-07-19 DIAGNOSIS — S32512A Fracture of superior rim of left pubis, initial encounter for closed fracture: Secondary | ICD-10-CM | POA: Diagnosis not present

## 2016-07-19 DIAGNOSIS — E785 Hyperlipidemia, unspecified: Secondary | ICD-10-CM | POA: Diagnosis not present

## 2016-07-19 DIAGNOSIS — Z7984 Long term (current) use of oral hypoglycemic drugs: Secondary | ICD-10-CM | POA: Insufficient documentation

## 2016-07-19 DIAGNOSIS — Z880 Allergy status to penicillin: Secondary | ICD-10-CM | POA: Insufficient documentation

## 2016-07-19 DIAGNOSIS — Z7982 Long term (current) use of aspirin: Secondary | ICD-10-CM | POA: Insufficient documentation

## 2016-07-19 DIAGNOSIS — E119 Type 2 diabetes mellitus without complications: Secondary | ICD-10-CM | POA: Diagnosis not present

## 2016-07-19 DIAGNOSIS — S32599A Other specified fracture of unspecified pubis, initial encounter for closed fracture: Secondary | ICD-10-CM | POA: Diagnosis present

## 2016-07-19 DIAGNOSIS — Y92002 Bathroom of unspecified non-institutional (private) residence single-family (private) house as the place of occurrence of the external cause: Secondary | ICD-10-CM | POA: Diagnosis not present

## 2016-07-19 DIAGNOSIS — S32592A Other specified fracture of left pubis, initial encounter for closed fracture: Principal | ICD-10-CM | POA: Insufficient documentation

## 2016-07-19 DIAGNOSIS — E039 Hypothyroidism, unspecified: Secondary | ICD-10-CM | POA: Diagnosis not present

## 2016-07-19 DIAGNOSIS — R52 Pain, unspecified: Secondary | ICD-10-CM

## 2016-07-19 DIAGNOSIS — S5012XA Contusion of left forearm, initial encounter: Secondary | ICD-10-CM | POA: Insufficient documentation

## 2016-07-19 DIAGNOSIS — Z91040 Latex allergy status: Secondary | ICD-10-CM | POA: Insufficient documentation

## 2016-07-19 HISTORY — DX: Unspecified osteoarthritis, unspecified site: M19.90

## 2016-07-19 HISTORY — DX: Unspecified atrial fibrillation: I48.91

## 2016-07-19 LAB — COMPREHENSIVE METABOLIC PANEL
ALT: 21 U/L (ref 14–54)
ANION GAP: 9 (ref 5–15)
AST: 35 U/L (ref 15–41)
Albumin: 4.2 g/dL (ref 3.5–5.0)
Alkaline Phosphatase: 79 U/L (ref 38–126)
BUN: 18 mg/dL (ref 6–20)
CHLORIDE: 101 mmol/L (ref 101–111)
CO2: 27 mmol/L (ref 22–32)
Calcium: 9.5 mg/dL (ref 8.9–10.3)
Creatinine, Ser: 0.61 mg/dL (ref 0.44–1.00)
GFR calc Af Amer: >60 mL/min (ref >60)
Glucose, Bld: 123 mg/dL — ABNORMAL HIGH (ref 65–99)
Potassium: 4.3 mmol/L (ref 3.5–5.1)
SODIUM: 137 mmol/L (ref 135–145)
Total Bilirubin: 1 mg/dL (ref 0.3–1.2)
Total Protein: 8 g/dL (ref 6.5–8.1)

## 2016-07-19 LAB — PROTIME-INR
INR: 1.08
PROTHROMBIN TIME: 14 s (ref 11.4–15.2)

## 2016-07-19 LAB — URINALYSIS, ROUTINE W REFLEX MICROSCOPIC
Bilirubin Urine: NEGATIVE
GLUCOSE, UA: NEGATIVE mg/dL
KETONES UR: NEGATIVE mg/dL
Leukocytes, UA: NEGATIVE
NITRITE: NEGATIVE
PH: 5 (ref 5.0–8.0)
PROTEIN: NEGATIVE mg/dL
Specific Gravity, Urine: 1.004 — ABNORMAL LOW (ref 1.005–1.030)
Squamous Epithelial / LPF: NONE SEEN

## 2016-07-19 LAB — CBC WITH DIFFERENTIAL/PLATELET
BASOS PCT: 0 %
Basophils Absolute: 0 10*3/uL (ref 0–0.1)
EOS ABS: 0.1 10*3/uL (ref 0–0.7)
EOS PCT: 2 %
HCT: 34.7 % — ABNORMAL LOW (ref 35.0–47.0)
Hemoglobin: 11.9 g/dL — ABNORMAL LOW (ref 12.0–16.0)
Lymphocytes Relative: 17 %
Lymphs Abs: 1.1 10*3/uL (ref 1.0–3.6)
MCH: 31.4 pg (ref 26.0–34.0)
MCHC: 34.2 g/dL (ref 32.0–36.0)
MCV: 91.7 fL (ref 80.0–100.0)
MONOS PCT: 13 %
Monocytes Absolute: 0.8 10*3/uL (ref 0.2–0.9)
Neutro Abs: 4.4 10*3/uL (ref 1.4–6.5)
Neutrophils Relative %: 68 %
PLATELETS: 164 10*3/uL (ref 150–440)
RBC: 3.79 MIL/uL — ABNORMAL LOW (ref 3.80–5.20)
RDW: 15.5 % — AB (ref 11.5–14.5)
WBC: 6.5 10*3/uL (ref 3.6–11.0)

## 2016-07-19 LAB — TROPONIN I

## 2016-07-19 MED ORDER — MORPHINE SULFATE (PF) 2 MG/ML IV SOLN
2.0000 mg | INTRAVENOUS | Status: DC | PRN
  Administered 2016-07-19: 2 mg via INTRAVENOUS
  Filled 2016-07-19: qty 1

## 2016-07-19 MED ORDER — VITAMIN E 45 MG (100 UNIT) PO CAPS
1000.0000 [IU] | ORAL_CAPSULE | Freq: Every day | ORAL | Status: DC
  Administered 2016-07-20: 08:00:00 1000 [IU] via ORAL
  Filled 2016-07-19 (×3): qty 2

## 2016-07-19 MED ORDER — ACETAMINOPHEN 650 MG RE SUPP: 650.0000 mg | Freq: Four times a day (QID) | RECTAL | Status: DC | PRN

## 2016-07-19 MED ORDER — LEVOTHYROXINE SODIUM 25 MCG PO TABS: 25.0000 ug | ORAL_TABLET | Freq: Every day | ORAL | Status: DC

## 2016-07-19 MED ORDER — SODIUM CHLORIDE 0.9 % IV SOLN
INTRAVENOUS | Status: DC
  Administered 2016-07-19: 21:00:00 via INTRAVENOUS

## 2016-07-19 MED ORDER — ZOLPIDEM TARTRATE 5 MG PO TABS
5.0000 mg | ORAL_TABLET | Freq: Every evening | ORAL | Status: DC | PRN
  Administered 2016-07-19: 5 mg via ORAL
  Filled 2016-07-19: qty 1

## 2016-07-19 MED ORDER — PRAVASTATIN SODIUM 20 MG PO TABS: 20.0000 mg | ORAL_TABLET | Freq: Every day | ORAL | Status: DC

## 2016-07-19 MED ORDER — KETOROLAC TROMETHAMINE 15 MG/ML IJ SOLN
15.0000 mg | Freq: Four times a day (QID) | INTRAMUSCULAR | Status: DC | PRN
  Administered 2016-07-19: 15 mg via INTRAVENOUS
  Filled 2016-07-19: qty 1

## 2016-07-19 MED ORDER — ENOXAPARIN SODIUM 40 MG/0.4ML ~~LOC~~ SOLN
40.0000 mg | SUBCUTANEOUS | Status: DC
  Administered 2016-07-19: 40 mg via SUBCUTANEOUS
  Filled 2016-07-19: qty 0.4

## 2016-07-19 MED ORDER — METFORMIN HCL 500 MG PO TABS
500.0000 mg | ORAL_TABLET | Freq: Two times a day (BID) | ORAL | Status: DC
  Administered 2016-07-20: 500 mg via ORAL
  Filled 2016-07-19: qty 1

## 2016-07-19 MED ORDER — DIGOXIN 125 MCG PO TABS
0.1250 mg | ORAL_TABLET | Freq: Every day | ORAL | Status: DC
  Administered 2016-07-20: 0.125 mg via ORAL
  Filled 2016-07-19 (×3): qty 1

## 2016-07-19 MED ORDER — ALBUTEROL SULFATE (2.5 MG/3ML) 0.083% IN NEBU: 2.5000 mg | INHALATION_SOLUTION | RESPIRATORY_TRACT | Status: DC | PRN

## 2016-07-19 MED ORDER — TRAMADOL HCL 50 MG PO TABS
50.0000 mg | ORAL_TABLET | Freq: Four times a day (QID) | ORAL | Status: DC | PRN
  Administered 2016-07-20 (×2): 50 mg via ORAL
  Filled 2016-07-19 (×2): qty 1

## 2016-07-19 MED ORDER — ACETAMINOPHEN 325 MG PO TABS: 650.0000 mg | ORAL_TABLET | Freq: Four times a day (QID) | ORAL | Status: DC | PRN

## 2016-07-19 MED ORDER — RALOXIFENE HCL 60 MG PO TABS
60.0000 mg | ORAL_TABLET | Freq: Every day | ORAL | Status: DC
  Administered 2016-07-19 – 2016-07-20 (×2): 60 mg via ORAL
  Filled 2016-07-19 (×3): qty 1

## 2016-07-19 MED ORDER — HYDROCODONE-ACETAMINOPHEN 5-325 MG PO TABS
1.0000 | ORAL_TABLET | Freq: Four times a day (QID) | ORAL | Status: DC | PRN
  Administered 2016-07-19 – 2016-07-20 (×3): 1 via ORAL
  Filled 2016-07-19 (×3): qty 1

## 2016-07-19 MED ORDER — FUROSEMIDE 20 MG PO TABS
20.0000 mg | ORAL_TABLET | Freq: Every day | ORAL | Status: DC
  Administered 2016-07-20: 20 mg via ORAL
  Filled 2016-07-19: qty 1

## 2016-07-19 MED ORDER — PANTOPRAZOLE SODIUM 40 MG PO TBEC
40.0000 mg | DELAYED_RELEASE_TABLET | Freq: Every day | ORAL | Status: DC
  Administered 2016-07-20: 40 mg via ORAL
  Filled 2016-07-19: qty 1

## 2016-07-19 MED ORDER — MORPHINE SULFATE (PF) 2 MG/ML IV SOLN
2.0000 mg | Freq: Once | INTRAVENOUS | Status: AC
  Administered 2016-07-19: 2 mg via INTRAVENOUS
  Filled 2016-07-19: qty 1

## 2016-07-19 MED ORDER — ASPIRIN 325 MG PO TABS
325.0000 mg | ORAL_TABLET | Freq: Every day | ORAL | Status: DC
  Administered 2016-07-19 – 2016-07-20 (×2): 325 mg via ORAL
  Filled 2016-07-19 (×2): qty 1

## 2016-07-19 NOTE — ED Notes (Signed)
Nurse unable to give report to nurse or charge nurse. Was told that I would be called back to give report in 5 to 10 min.

## 2016-07-19 NOTE — ED Triage Notes (Signed)
PAtient reports losing her balance at home and falling. States "I did not trip over anything. Sometimes I lose my balance" A&O x4. Hematoma noted to L forearm. Patient c/o pain L leg. HX L leg surgery

## 2016-07-19 NOTE — ED Provider Notes (Signed)
Care signed over from Dr. York CeriseForbach pending reevaluation. The patient has nonoperative pelvic fractures but she is required multiple doses of IV pain medication requiring inpatient admission.   Merrily Brittleifenbark, Kidus Delman, MD 07/19/16 [phone]

## 2016-07-19 NOTE — ED Notes (Signed)
Pt's husband has been out in the hallway asking when patient was going upstairs.

## 2016-07-19 NOTE — H&P (Signed)
[facility] [place] at [facility]   PATIENT NAME: Bethany Joseph    MR#:  [id number]  DATE OF BIRTH:  [birth year]  DATE OF ADMISSION:  07/19/2016  PRIMARY CARE PHYSICIAN: Yisroel Ramming, MD   REQUESTING/REFERRING PHYSICIAN: Merrily Brittle, MD  CHIEF COMPLAINT:  fall  HISTORY OF PRESENT ILLNESS:  Bethany Joseph  is a 71 y.o. female with a known history of Atrial fibrillation, diabetes mellitus, hyperlipidemia, hypothyroidism, COPD came into the ED after she sustained a fall. Patient was reporting severe left hip pain and pain in the left forearm. X-ray has revealed left superior and inferior pubic ramus fracture.Patient also has a large hematoma and ecchymosis to the left forearm and bruises on the extremities. ED physician has consulted orthopedics Dr. Joice Lofts who has recommended no surgical intervention PAST MEDICAL HISTORY:   Past Medical History:  Diagnosis Date  . Allergic rhinitis   . Aortic valve disorder   . Asthma   . Atrial fibrillation (HCC)   . Avascular necrosis (HCC)   . Diabetes (HCC)   . Hyperlipidemia   . Hyperthyroidism   . Osteoarthritis   . Osteoporosis     PAST SURGICAL HISTOIRY:   Past Surgical History:  Procedure Laterality Date  . ABDOMINAL HYSTERECTOMY    . CHOLECYSTECTOMY    . ORIF FIBULA FRACTURE    . ORIF HUMERUS FRACTURE    . REPLACEMENT TOTAL KNEE BILATERAL      SOCIAL HISTORY:   Social History  Substance Use Topics  . Smoking status: Former Games developer  . Smokeless tobacco: Never Used  . Alcohol use 8.4 oz/week    14 Glasses of wine per week    FAMILY HISTORY:  History reviewed. No pertinent family history.  DRUG ALLERGIES:   Allergies  Allergen Reactions  . Codeine   . Latex   . Neosporin [Neomycin-Polymyxin-Gramicidin]   . Penicillins     REVIEW OF SYSTEMS:  CONSTITUTIONAL: No fever, fatigue or weakness.  EYES: No blurred or double vision.  EARS, NOSE, AND THROAT: No tinnitus or ear pain.   RESPIRATORY: No cough, shortness of breath, wheezing or hemoptysis.  CARDIOVASCULAR: No chest pain, orthopnea, edema.  GASTROINTESTINAL: No nausea, vomiting, diarrhea or abdominal pain.  GENITOURINARY: No dysuria, hematuria.  ENDOCRINE: No polyuria, nocturia,  HEMATOLOGY: No anemia, easy bruising or bleeding SKIN: No rash or lesion. MUSCULOSKELETAL:Left hip pain  NEUROLOGIC: No tingling, numbness, weakness.  PSYCHIATRY: No anxiety or depression.   MEDICATIONS AT HOME:   Prior to Admission medications   Medication Sig Start Date End Date Taking? Authorizing Provider  albuterol (PROAIR HFA) 108 (90 Base) MCG/ACT inhaler USE 2 PUFFS TWICE DAILY AS NEEDED 04/02/14  Yes [provider]  aspirin 325 MG tablet Take 325 mg by mouth daily.   Yes [provider]  cholecalciferol (VITAMIN D) 400 units TABS tablet Take 400 Units by mouth.   Yes [provider]  digoxin (LANOXIN) 0.125 MG tablet Take 0.125 mg by mouth daily.   Yes [provider]  furosemide (LASIX) 20 MG tablet Take 20 mg by mouth daily.    Yes [provider]  HYDROcodone-acetaminophen (NORCO/VICODIN) 5-325 MG tablet Take 1 tablet by mouth every 6 (six) hours as needed for moderate pain.   Yes [provider]  levothyroxine (SYNTHROID, LEVOTHROID) 25 MCG tablet Take 25 mcg by mouth daily before breakfast.   Yes [provider]  metFORMIN (GLUCOPHAGE) 500 MG tablet Take by mouth 2 (two) times daily with a meal.  Yes [provider]  omeprazole (PRILOSEC) 40 MG capsule TAKE ONE CAPSULE BY MOUTH DAILY 06/13/15  Yes [provider]  raloxifene (EVISTA) 60 MG tablet TAKE ONE (1) TABLET BY MOUTH EVERY DAY 02/22/16  Yes [provider]  vitamin E 1000 UNIT capsule Take 1,000 Units by mouth daily.   Yes [provider]  pravastatin (PRAVACHOL) 20 MG tablet Take 20 mg by mouth. 10/24/15   [provider]  silver sulfADIAZINE (SILVADENE) 1  % cream Apply 1 application topically 2 (two) times daily. Patient not taking: Reported on 07/19/2016 01/25/16   Bethany Joseph, [place], MD      VITAL SIGNS:  Blood pressure (!) 142/83, pulse 67, temperature 98.4 F (36.9 C), temperature source Oral, resp. rate (!) 21, height 5\' 7"  (1.702 m), weight 74.8 kg (165 lb), SpO2 95 %.  PHYSICAL EXAMINATION:  GENERAL:  71 y.o.-year-old patient lying in the bed with no acute distress.  EYES: Pupils equal, round, reactive to light and accommodation. No scleral icterus. Extraocular muscles intact.  HEENT: Head atraumatic, normocephalic. Oropharynx and nasopharynx clear.  NECK:  Supple, no jugular venous distention. No thyroid enlargement, no tenderness.  LUNGS: Normal breath sounds bilaterally, no wheezing, rales,rhonchi or crepitation. No use of accessory muscles of respiration.  CARDIOVASCULAR: S1, S2 normal. No murmurs, rubs, or gallops.  ABDOMEN: Soft, nontender, nondistended. Bowel sounds present. No organomegaly or mass.  EXTREMITIES: Left hip is tender. Left elbow is tender  No pedal edema, cyanosis, or clubbing.  NEUROLOGIC: Cranial nerves grossly intact. Motor is grossly intact except left hip area is tender Sensation intact. Gait not checked.  PSYCHIATRIC: The patient is alert and oriented x 3.  SKIN: No obvious rash, lesion, or ulcer.   LABORATORY PANEL:   CBC  Recent Labs Lab 07/19/16 1458  WBC 6.5  HGB 11.9*  HCT 34.7*  PLT 164   ------------------------------------------------------------------------------------------------------------------  Chemistries   Recent Labs Lab 07/19/16 1458  NA 137  K 4.3  CL 101  CO2 27  GLUCOSE 123*  BUN 18  CREATININE 0.61  CALCIUM 9.5  AST 35  ALT 21  ALKPHOS 79  BILITOT 1.0   ------------------------------------------------------------------------------------------------------------------  Cardiac Enzymes  Recent Labs Lab 07/19/16 1458  TROPONINI <0.03    ------------------------------------------------------------------------------------------------------------------  RADIOLOGY:  Dg Forearm Left  Result Date: 07/19/2016 CLINICAL DATA:  Fall EXAM: LEFT FOREARM - 2 VIEW COMPARISON:  None. FINDINGS: Posterior plate fusion of the distal humerus with callus formation. No fracture of the radius or ulna on single view. Degenerate spurring of the radial head. Small effusion anterior to the distal humerus IMPRESSION: 1. Small anterior effusion at the elbow joint. 2. No evidence of acute fracture Electronically Signed   By: Genevive BiStewart  Edmunds M.D.   On: 07/19/2016 14:30   Dg Hand Complete Right  Result Date: 07/19/2016 CLINICAL DATA:  Right hand pain after fall. EXAM: RIGHT HAND - COMPLETE 3+ VIEW COMPARISON:  None. FINDINGS: There is no evidence of fracture or dislocation. Severe narrowing and sclerosis of the first carpal/metacarpal joint is noted. Narrowing and osteophyte formation is seen involving the fifth distal interphalangeal joint. Narrowing of several intercarpal joints is noted as well. Soft tissues are unremarkable. IMPRESSION: Degenerative joint disease as described above. No acute abnormality seen in the right hand. Electronically Signed   By: Lupita RaiderJames  Green Jr, M.D.   On: 07/19/2016 14:33   Dg Hip Unilat W Or Wo Pelvis 2-3 Views Left  Result Date: 07/19/2016 CLINICAL DATA:  Left hip pain since a  fall this morning. History of prior hip fracture. Initial encounter. EXAM: DG HIP (WITH OR WITHOUT PELVIS) 2-3V LEFT COMPARISON:  None. FINDINGS: Nondisplaced high left superior pubic ramus fracture is identified. There is also a nondisplaced fracture of the left inferior pubic ramus. No other acute bony or joint abnormality is seen. Healed left hip fracture with a dynamic hip screw in place noted. Bones are osteopenic. Lower lumbar spondylosis is seen. Soft tissues are unremarkable. IMPRESSION: Nondisplaced left superior and inferior pubic ramus fractures.  Negative for acute hip fracture. Remote healed hip fracture with a dynamic hip screw in place noted. Osteopenia. Electronically Signed   By: Drusilla Kanner M.D.   On: 07/19/2016 14:29    EKG:   Orders placed or performed during the hospital encounter of 07/19/16  . ED EKG  . ED EKG  . EKG 12-Lead  . EKG 12-Lead    IMPRESSION AND PLAN:   Ahlivia Salahuddin  is a 71 y.o. female with a known history of Atrial fibrillation, diabetes mellitus, hyperlipidemia, hypothyroidism, COPD came into the ED after she sustained a fall. Patient was reporting severe left hip pain and pain in the left forearm. X-ray has revealed left superior and inferior pubic ramus fracture.   #Intractable pain secondary to Nondisplaced left superior and inferior pubic ramus fractures Admit to MedSurg unit Pain management as needed PT evaluation Dr. Joice Lofts has recommended no surgical interventions Supportive treatment  #Chronic COPD No exacerbation at this time Nebulizer treatments as needed  #Diabetes mellitus Sliding scale insulin Continue home meds  #Chronic history of atrial fibrillation Rate controlled. Continue home medications aspirin 325 mg and digoxin. Check dig level in a.m.   #Hypothyroidism continue Synthroid  DVT prophylaxis with Lovenox subcutaneous   All the records are reviewed and case discussed with ED provider. Management plans discussed with the patient, family and they are in agreement.  CODE STATUS: fc ,husband is HCPOA  TOTAL TIME TAKING CARE OF THIS PATIENT: 45 minutes.   Note: This dictation was prepared with Dragon dictation along with smaller phrase technology. Any transcriptional errors that result from this process are unintentional.  Ramonita Lab M.D on 07/19/2016 at 6:05 PM  Between 7am to 6pm - Pager - [phone]  After 6pm go to [email or web address] - password [email or web address] [facility]  [facility] [place] [facility]  Office  [phone]  CC: Primary care physician; Yisroel Ramming, MD

## 2016-07-19 NOTE — ED Provider Notes (Signed)
[facility] Emergency Department Provider Note  ____________________________________________   First MD Initiated Contact with Patient 07/19/16 1334     (approximate)  I have reviewed the triage vital signs and the nursing notes.   HISTORY  Chief Complaint Fall    HPI Bethany Joseph is a 71 y.o. female who presents by EMS for evaluation of severe pain in her left hip, as well mild pain in her left forearm and her right hand.  She had a mechanical fall last night in the bathroom.  She states that sometimes she loses her balance and she has had numerous falls in the past.  She and her husband got her back to her bed but the pain was worse and severe by today.  Any amount of movement or attempted weightbearing makes the pain in her left hip and pelvis intolerable.  The pain in her left forearm and right hand are only when she touches the sites; she has a large hematoma and ecchymosis to the left forearm, and her right hand is bruised but she is able to use it normally and has no decrease in grip strength or ability to use the hand.  She denies any recent illnesses.  She denies headache and neck pain.  She denies chest pain, shortness of breath, nausea, vomiting, abdominal pain, dysuria.   Past Medical History:  Diagnosis Date  . Allergic rhinitis   . Aortic valve disorder   . Asthma   . Atrial fibrillation (HCC)   . Avascular necrosis (HCC)   . Diabetes (HCC)   . Hyperlipidemia   . Hyperthyroidism   . Osteoarthritis   . Osteoporosis     Patient Active Problem List   Diagnosis Date Noted  . Leg pain 06/25/2016  . Varicose veins of lower extremities with ulcer (HCC) 03/26/2016  . Chronic venous insufficiency 03/26/2016  . Essential hypertension 03/26/2016  . Type 2 diabetes mellitus (HCC) 03/26/2016  . COPD (chronic obstructive pulmonary disease) (HCC) 03/26/2016    Past Surgical History:  Procedure Laterality Date  . ABDOMINAL HYSTERECTOMY      . CHOLECYSTECTOMY    . ORIF FIBULA FRACTURE    . ORIF HUMERUS FRACTURE    . REPLACEMENT TOTAL KNEE BILATERAL      Prior to Admission medications   Medication Sig Start Date End Date Taking? Authorizing Provider  albuterol (PROAIR HFA) 108 (90 Base) MCG/ACT inhaler USE 2 PUFFS TWICE DAILY AS NEEDED 04/02/14  Yes [provider]  aspirin 325 MG tablet Take 325 mg by mouth daily.   Yes [provider]  cholecalciferol (VITAMIN D) 400 units TABS tablet Take 400 Units by mouth.   Yes [provider]  digoxin (LANOXIN) 0.125 MG tablet Take 0.125 mg by mouth daily.   Yes [provider]  furosemide (LASIX) 20 MG tablet Take 20 mg by mouth daily.    Yes [provider]  HYDROcodone-acetaminophen (NORCO/VICODIN) 5-325 MG tablet Take 1 tablet by mouth every 6 (six) hours as needed for moderate pain.   Yes [provider]  levothyroxine (SYNTHROID, LEVOTHROID) 25 MCG tablet Take 25 mcg by mouth daily before breakfast.   Yes [provider]  metFORMIN (GLUCOPHAGE) 500 MG tablet Take by mouth 2 (two) times daily with a meal.   Yes [provider]  omeprazole (PRILOSEC) 40 MG capsule TAKE ONE CAPSULE BY MOUTH DAILY 06/13/15  Yes [provider]  raloxifene (EVISTA) 60 MG tablet TAKE ONE (1) TABLET BY MOUTH EVERY DAY  02/22/16  Yes [provider]  vitamin E 1000 UNIT capsule Take 1,000 Units by mouth daily.   Yes [provider]  pravastatin (PRAVACHOL) 20 MG tablet Take 20 mg by mouth. 10/24/15   [provider]  silver sulfADIAZINE (SILVADENE) 1 % cream Apply 1 application topically 2 (two) times daily. Patient not taking: Reported on 07/19/2016 01/25/16   Payton Mccallumonty, [place], MD    Allergies Codeine; Latex; Neosporin [neomycin-polymyxin-gramicidin]; and Penicillins  History reviewed. No pertinent family history.  Social History Social History  Substance Use Topics  . Smoking status: Former Games developermoker  .  Smokeless tobacco: Never Used  . Alcohol use 8.4 oz/week    14 Glasses of wine per week    Review of Systems Constitutional: No fever/chills Eyes: No visual changes. ENT: No sore throat. Cardiovascular: Denies chest pain. Respiratory: Denies shortness of breath. Gastrointestinal: No abdominal pain.  No nausea, no vomiting.  No diarrhea.  No constipation. Genitourinary: Negative for dysuria. Musculoskeletal: Severe pain in left side of pelvis and left hip.  Mild tenderness in left forearm with hematoma and bruising of right hand with minimal discomfort Integumentary: Negative for rash. Bruising on right hand and left forearm. Neurological: Negative for headaches, focal weakness or numbness.   ____________________________________________   PHYSICAL EXAM:  VITAL SIGNS: ED Triage Vitals  Enc Vitals Group     BP 07/19/16 1304 (!) 176/96     Pulse Rate 07/19/16 1304 73     Resp 07/19/16 1304 18     Temp 07/19/16 1304 98.4 F (36.9 C)     Temp Source 07/19/16 1304 Oral     SpO2 07/19/16 1304 94 %     Weight 07/19/16 1306 74.8 kg (165 lb)     Height 07/19/16 1306 1.702 m (5\' 7" )     Head Circumference --      Peak Flow --      Pain Score 07/19/16 1303 7     Pain Loc --      Pain Edu? --      Excl. in GC? --     Constitutional: Alert and oriented. Well appearing and in no acute distress. Eyes: Conjunctivae are normal.  Head: Atraumatic. Nose: No congestion/rhinnorhea. Mouth/Throat: Mucous membranes are moist. Neck: No stridor.  No meningeal signs.  No cervical spine tenderness to palpation. Cardiovascular: Normal rate, regular rhythm. Good peripheral circulation. Grossly normal heart sounds. Respiratory: Normal respiratory effort.  No retractions. Lungs CTAB. Gastrointestinal: Soft and nontender. No distention.  Musculoskeletal: Severe pain in the left side of her pelvis with attempted passive range of motion of left hip.  Pain with palpation of the pelvis but the pelvis is  stable.  Ecchymosis/contusion left forearm, ecchymosis on right hand Neurologic:  Normal speech and language. No gross focal neurologic deficits are appreciated.  Skin:  Skin is warm, dry and intact.  The patient has a large hematoma and ecchymosis on the left forearm as well as on her right hand most notable over the finger MCP but she has normal use of the hand Psychiatric: Mood and affect are normal. Speech and behavior are normal.  ____________________________________________   LABS (all labs ordered are listed, but only abnormal results are displayed)  Labs Reviewed  CBC WITH DIFFERENTIAL/PLATELET - Abnormal; Notable for the following:       Result Value   RBC 3.79 (*)    Hemoglobin 11.9 (*)    HCT 34.7 (*)    RDW 15.5 (*)    All other  components within normal limits  PROTIME-INR  COMPREHENSIVE METABOLIC PANEL  TROPONIN I  URINALYSIS, ROUTINE W REFLEX MICROSCOPIC   ____________________________________________  EKG  ED ECG REPORT I, Breccan Galant, the attending physician, personally viewed and interpreted this ECG.  Date: 07/19/2016 EKG Time: 14:55 Rate: 78 Rhythm: atrial fibrillation (with some artifact present) QRS Axis: normal Intervals: normal ST/T Wave abnormalities: ST depression in lead V4 and V5 Conduction Disturbances: none Narrative Interpretation: No evidence of acute ischemia and the EKG is essentially unchanged from the last EKG on record from 2014  ____________________________________________  RADIOLOGY   Dg Forearm Left  Result Date: 07/19/2016 CLINICAL DATA:  Fall EXAM: LEFT FOREARM - 2 VIEW COMPARISON:  None. FINDINGS: Posterior plate fusion of the distal humerus with callus formation. No fracture of the radius or ulna on single view. Degenerate spurring of the radial head. Small effusion anterior to the distal humerus IMPRESSION: 1. Small anterior effusion at the elbow joint. 2. No evidence of acute fracture Electronically Signed   By: Genevive Bi M.D.   On: 07/19/2016 14:30   Dg Hand Complete Right  Result Date: 07/19/2016 CLINICAL DATA:  Right hand pain after fall. EXAM: RIGHT HAND - COMPLETE 3+ VIEW COMPARISON:  None. FINDINGS: There is no evidence of fracture or dislocation. Severe narrowing and sclerosis of the first carpal/metacarpal joint is noted. Narrowing and osteophyte formation is seen involving the fifth distal interphalangeal joint. Narrowing of several intercarpal joints is noted as well. Soft tissues are unremarkable. IMPRESSION: Degenerative joint disease as described above. No acute abnormality seen in the right hand. Electronically Signed   By: Lupita Raider, M.D.   On: 07/19/2016 14:33   Dg Hip Unilat W Or Wo Pelvis 2-3 Views Left  Result Date: 07/19/2016 CLINICAL DATA:  Left hip pain since a fall this morning. History of prior hip fracture. Initial encounter. EXAM: DG HIP (WITH OR WITHOUT PELVIS) 2-3V LEFT COMPARISON:  None. FINDINGS: Nondisplaced high left superior pubic ramus fracture is identified. There is also a nondisplaced fracture of the left inferior pubic ramus. No other acute bony or joint abnormality is seen. Healed left hip fracture with a dynamic hip screw in place noted. Bones are osteopenic. Lower lumbar spondylosis is seen. Soft tissues are unremarkable. IMPRESSION: Nondisplaced left superior and inferior pubic ramus fractures. Negative for acute hip fracture. Remote healed hip fracture with a dynamic hip screw in place noted. Osteopenia. Electronically Signed   By: Drusilla Kanner M.D.   On: 07/19/2016 14:29    ____________________________________________   PROCEDURES  Critical Care performed: No   Procedure(s) performed:   Procedures   ____________________________________________   INITIAL IMPRESSION / ASSESSMENT AND PLAN / ED COURSE  Pertinent labs & imaging results that were available during my care of the patient were reviewed by me and considered in my medical decision making  (see chart for details).  Evidence of head or neck injury, I will proceed with plain radiographs for further evaluation but the patient will need pain control as soon as possible after a note with what we are dealing.  No evidence of any acute infectious process at this time.   Clinical Course as of Jul 20 1530  Thu Jul 19, 2016  1442 Forearm and hand radiographs do not demonstrate any acute bony injury, but the hip radiographs with pelvis on the left side demonstrates superior and inferior pubic rami fractures.  There are nondisplaced but the patient is in a significant amount of pain.  I will  perform standard workup and attempt to control her pain but at this point she is unable to bear any weight and I anticipate admission for intractable pain with plans for PT and OT consults and placement. DG Hip Unilat W or Wo Pelvis 2-3 Views Left [CF]  1527 Transferring ED care to Dr. Lamont Snowball to follow up pain management plan and admit as needed.  Discussed case by phone with Dr. Joice Lofts who confirmed management is non-operative.  [CF]    Clinical Course User Index [CF] Loleta Rose, MD    ____________________________________________  FINAL CLINICAL IMPRESSION(S) / ED DIAGNOSES  Final diagnoses:  Closed fracture of left superior pubic ramus, initial encounter (HCC)  Closed fracture of left inferior pubic ramus, initial encounter (HCC)  Contusion of left forearm, initial encounter  Contusion of finger of right hand, unspecified finger, initial encounter  Intractable pain     MEDICATIONS GIVEN DURING THIS VISIT:  Medications  morphine 2 MG/ML injection 2 mg (not administered)  morphine 2 MG/ML injection 2 mg (2 mg Intravenous Given 07/19/16 1507)     NEW OUTPATIENT MEDICATIONS STARTED DURING THIS VISIT:  New Prescriptions   No medications on file    Modified Medications   No medications on file    Discontinued Medications   BLOOD GLUCOSE MONITORING SUPPL (GLUCOCOM BLOOD GLUCOSE  MONITOR) DEVI    Test daily before all meals/snacks and once before bedtime.   CLINDAMYCIN (CLEOCIN) 300 MG CAPSULE    Take 1 capsule (300 mg total) by mouth 3 (three) times daily.   LANCETS (FREESTYLE) LANCETS    Test daily before all meals/snacks and once before bedtime.   LIOTHYRONINE (CYTOMEL) 25 MCG TABLET    Take by mouth daily.   ZOLPIDEM (AMBIEN) 10 MG TABLET    Take 10 mg by mouth at bedtime as needed for sleep.     Note:  This document was prepared using Dragon voice recognition software and may include unintentional dictation errors.    Loleta Rose, MD 07/19/16 [phone]

## 2016-07-19 NOTE — ED Notes (Signed)
Patient transported to X-ray 

## 2016-07-19 NOTE — Progress Notes (Signed)
Family Meeting Note  Advance Directive:yes  Today a meeting took place with the Patient and spouse at bed side    The following clinical team members were present during this meeting:MD  The following were discussed:Patient's diagnosis: Treatment plan of care was discussed in detail with the patient and her husband at bedside, Patient's progosis: Unable to determine and Goals for treatment: Full Code, husband is healthcare power of attorney  Additional follow-up to be provided: Hospitalist physical therapist and case manager  Time spent during discussion:17 min  Alys Dulak, Deanna ArtisAruna, MD

## 2016-07-20 ENCOUNTER — Encounter
Admission: RE | Admit: 2016-07-20 | Discharge: 2016-07-20 | Disposition: A | Discharge status: Home / Self Care | Payer: Medicare Other | Source: Ambulatory Visit | Attending: Internal Medicine | Admitting: Internal Medicine

## 2016-07-20 LAB — COMPREHENSIVE METABOLIC PANEL
ALBUMIN: 3.4 g/dL — AB (ref 3.5–5.0)
ALK PHOS: 64 U/L (ref 38–126)
ALT: 16 U/L (ref 14–54)
AST: 22 U/L (ref 15–41)
Anion gap: 5 (ref 5–15)
BUN: 18 mg/dL (ref 6–20)
CALCIUM: 8.7 mg/dL — AB (ref 8.9–10.3)
CO2: 28 mmol/L (ref 22–32)
CREATININE: 0.73 mg/dL (ref 0.44–1.00)
Chloride: 106 mmol/L (ref 101–111)
GFR calc Af Amer: >60 mL/min (ref >60)
GFR calc non Af Amer: >60 mL/min (ref >60)
GLUCOSE: 125 mg/dL — AB (ref 65–99)
Potassium: 3.9 mmol/L (ref 3.5–5.1)
SODIUM: 139 mmol/L (ref 135–145)
Total Bilirubin: 0.8 mg/dL (ref 0.3–1.2)
Total Protein: 6.7 g/dL (ref 6.5–8.1)

## 2016-07-20 LAB — CBC
HCT: 33.1 % — ABNORMAL LOW (ref 35.0–47.0)
HEMOGLOBIN: 11.3 g/dL — AB (ref 12.0–16.0)
MCH: 31.9 pg (ref 26.0–34.0)
MCHC: 34.2 g/dL (ref 32.0–36.0)
MCV: 93.2 fL (ref 80.0–100.0)
Platelets: 136 10*3/uL — ABNORMAL LOW (ref 150–440)
RBC: 3.56 MIL/uL — AB (ref 3.80–5.20)
RDW: 15.9 % — ABNORMAL HIGH (ref 11.5–14.5)
WBC: 4.9 10*3/uL (ref 3.6–11.0)

## 2016-07-20 MED ORDER — ENOXAPARIN SODIUM 40 MG/0.4ML ~~LOC~~ SOLN: 40.0000 mg | SUBCUTANEOUS | Status: DC

## 2016-07-20 MED ORDER — HYDROCODONE-ACETAMINOPHEN 5-325 MG PO TABS: 1.0000 | ORAL_TABLET | Freq: Four times a day (QID) | ORAL | 0 refills | Status: DC | PRN

## 2016-07-20 NOTE — Care Management Note (Signed)
Case Management Note  Patient Details  Name: Bethany Joseph MRN: [id number] Date of Birth: August 19, 1945  Subjective/Objective:                   Met with patient to discuss discharge planning. She agrees to SNF if needed. She is from home with her husband and at baseline pretty independent. She does have a wheelchair, rollator, and front-wheeled walker available for use in the home. She uses [facility] for medication. PT is recommending SNF at this time. Action/Plan: No current RNCM needs. MOON delivered/explained and home health list left with patient to review.   Expected Discharge Date:                  Expected Discharge Plan:     In-House Referral:     Discharge planning Services     Post Acute Care Choice:    Choice offered to:     DME Arranged:    DME Agency:     HH Arranged:    HH Agency:     Status of Service:     If discussed at [employer], dates discussed:    Additional Comments:  Marshell Garfinkel, RN 07/20/2016, 12:50 PM

## 2016-07-20 NOTE — Care Management (Signed)
Message sent to Dr. Joice LoftsPoggi requesting verbal order for home Lovenox which is usually 40mg  daily injection for 2 weeks- discharge plan pending.

## 2016-07-20 NOTE — Care Management Obs Status (Signed)
MEDICARE OBSERVATION STATUS NOTIFICATION   Patient Details  Name: Georgiana SpinnerKarla Tufo MRN: [id number] Date of Birth: 06/12/1945   Medicare Observation Status Notification Given:  Yes    Collie Siadngela Oluwadarasimi Redmon, RN 07/20/2016, 12:50 PM

## 2016-07-20 NOTE — Discharge Summary (Addendum)
[facility] [place] at [facility]  Bethany Joseph, 71 y.o., DOB 09/25/1945, MRN [id number]. Admission date: 07/19/2016 Discharge Date 07/20/2016 Primary MD Bethany Joseph, Bethany L, MD Admitting Physician Bethany LabAruna Gouru, MD  Admission Diagnosis  Intractable pain [R52] Contusion of left forearm, initial encounter [S50.12XA] Closed fracture of left inferior pubic ramus, initial encounter [facility](HCC) [S32.592A] Closed fracture of left superior pubic ramus, initial encounter (HCC) [S32.512A] Contusion of finger of right hand, unspecified finger, initial encounter [S60.00XA]  Discharge Diagnosis   Active Problems:   Fracture of multiple pubic rami    Allergic rhinitis   Asthma   Atrial fibrillation   Diabetes type 2   Hyperlipidemia   Hypertension   Osteoarthritis        Hospital Course Bethany Joseph  is a 71 y.o. female with a known history of Atrial fibrillation, diabetes mellitus, hyperlipidemia, hypothyroidism, COPD came into the ED after she sustained a fall. Patient was reporting severe left hip pain and pain in the left forearm. X-ray has revealed left superior and inferior pubic ramus fracture.Patient also has a large hematoma and ecchymosis to the left forearm and bruises on the extremities. The emergency room physician discussed the case with the orthopedic doctor who stated that there was no surgical intervention needed weightbearing as tolerated. Patient was seen by physical therapy and they recommended skilled nursing facility. Which is being arranged.      lovenox duration 14 days.      Consults  orthopedic surgery  Significant Tests:  See full reports for all details     Dg Forearm Left  Result Date: 07/19/2016 CLINICAL DATA:  Fall EXAM: LEFT FOREARM - 2 VIEW COMPARISON:  None. FINDINGS: Posterior plate fusion of the distal humerus with callus formation. No fracture of the radius or ulna on single view. Degenerate spurring of the radial head. Small effusion  anterior to the distal humerus IMPRESSION: 1. Small anterior effusion at the elbow joint. 2. No evidence of acute fracture Electronically Signed   By: Bethany BiStewart  Joseph M.D.   On: 07/19/2016 14:30   Dg Hand Complete Right  Result Date: 07/19/2016 CLINICAL DATA:  Right hand pain after fall. EXAM: RIGHT HAND - COMPLETE 3+ VIEW COMPARISON:  None. FINDINGS: There is no evidence of fracture or dislocation. Severe narrowing and sclerosis of the first carpal/metacarpal joint is noted. Narrowing and osteophyte formation is seen involving the fifth distal interphalangeal joint. Narrowing of several intercarpal joints is noted as well. Soft tissues are unremarkable. IMPRESSION: Degenerative joint disease as described above. No acute abnormality seen in the right hand. Electronically Signed   By: Bethany RaiderJames  Green Joseph, M.D.   On: 07/19/2016 14:33   Dg Hip Unilat W Or Wo Pelvis 2-3 Views Left  Result Date: 07/19/2016 CLINICAL DATA:  Left hip pain since a fall this morning. History of prior hip fracture. Initial encounter. EXAM: DG HIP (WITH OR WITHOUT PELVIS) 2-3V LEFT COMPARISON:  None. FINDINGS: Nondisplaced high left superior pubic ramus fracture is identified. There is also a nondisplaced fracture of the left inferior pubic ramus. No other acute bony or joint abnormality is seen. Healed left hip fracture with a dynamic hip screw in place noted. Bones are osteopenic. Lower lumbar spondylosis is seen. Soft tissues are unremarkable. IMPRESSION: Nondisplaced left superior and inferior pubic ramus fractures. Negative for acute hip fracture. Remote healed hip fracture with a dynamic hip screw in place noted. Osteopenia. Electronically Signed   By: Bethany Kannerhomas  Joseph M.D.   On: 07/19/2016 14:29  Today   Subjective:   Bethany Joseph patient states the pain under control  Objective:   Blood pressure (!) 174/70, pulse 65, temperature 99.1 F (37.3 C), temperature source Oral, resp. rate 19, height 5\' 7"  (1.702 m),  weight 165 lb (74.8 kg), SpO2 97 %.  .  Intake/Output Summary (Last 24 hours) at 07/20/16 1313 Last data filed at 07/20/16 0630  Gross per 24 hour  Intake           731.25 ml  Output                0 ml  Net           731.25 ml    Exam VITAL SIGNS: Blood pressure (!) 174/70, pulse 65, temperature 99.1 F (37.3 C), temperature source Oral, resp. rate 19, height 5\' 7"  (1.702 m), weight 165 lb (74.8 kg), SpO2 97 %.  GENERAL:  71 y.o.-year-old patient lying in the bed with no acute distress.  EYES: Pupils equal, round, reactive to light and accommodation. No scleral icterus. Extraocular muscles intact.  HEENT: Head atraumatic, normocephalic. Oropharynx and nasopharynx clear.  NECK:  Supple, no jugular venous distention. No thyroid enlargement, no tenderness.  LUNGS: Normal breath sounds bilaterally, no wheezing, rales,rhonchi or crepitation. No use of accessory muscles of respiration.  CARDIOVASCULAR: S1, S2 normal. No murmurs, rubs, or gallops.  ABDOMEN: Soft, nontender, nondistended. Bowel sounds present. No organomegaly or mass.  EXTREMITIES: No pedal edema, cyanosis, or clubbing.  NEUROLOGIC: Cranial nerves II through XII are intact. Muscle strength 5/5 in all extremities. Sensation intact. Gait not checked.  PSYCHIATRIC: The patient is alert and oriented x 3.  SKIN: No obvious rash, lesion, or ulcer.   Data Review     CBC w Diff:  Lab Results  Component Value Date   WBC 4.9 07/20/2016   HGB 11.3 (Joseph) 07/20/2016   HGB 13.8 05/27/2012   HCT 33.1 (Joseph) 07/20/2016   HCT 40.9 05/27/2012   PLT 136 (Joseph) 07/20/2016   PLT 188 05/27/2012   LYMPHOPCT 17 07/19/2016   MONOPCT 13 07/19/2016   EOSPCT 2 07/19/2016   BASOPCT 0 07/19/2016   CMP:  Lab Results  Component Value Date   NA 139 07/20/2016   NA 136 05/27/2012   K 3.9 07/20/2016   K 4.0 05/27/2012   CL 106 07/20/2016   CL 102 05/27/2012   CO2 28 07/20/2016   CO2 28 05/27/2012   BUN 18 07/20/2016   BUN 13 05/27/2012    CREATININE 0.73 07/20/2016   CREATININE 0.58 (Joseph) 05/27/2012   PROT 6.7 07/20/2016   ALBUMIN 3.4 (Joseph) 07/20/2016   BILITOT 0.8 07/20/2016   ALKPHOS 64 07/20/2016   AST 22 07/20/2016   ALT 16 07/20/2016  .  Micro Results No results found for this or any previous visit (from the past 240 hour(s)).      Code Status Orders        Start     Ordered   07/19/16 2032  Full code  Continuous     07/19/16 2033    Code Status History    Date Active Date Inactive Code Status Order ID Comments User Context   This patient has a current code status but no historical code status.    Advance Directive Documentation     Most Recent Value  Type of Advance Directive  Healthcare Power of Attorney  Pre-existing out of facility DNR order (yellow form or pink MOST form)  -  "MOST"  Form in Place?  -          Follow-up Information    Bethany Ramming, MD Follow up in 2 week(s).   Specialty:  Internal Medicine Contact information: [address] [facility] [place] [address] [phone]           Discharge Medications   Allergies as of 07/20/2016      Reactions   Codeine    Latex    Neosporin [neomycin-polymyxin-gramicidin]    Penicillins       Medication List    TAKE these medications   aspirin 325 MG tablet Take 325 mg by mouth daily.   cholecalciferol 400 units Tabs tablet Commonly known as:  VITAMIN D Take 400 Units by mouth.   digoxin 0.125 MG tablet Commonly known as:  LANOXIN Take 0.125 mg by mouth daily.   enoxaparin 40 MG/0.4ML injection Commonly known as:  LOVENOX Inject 0.4 mLs (40 mg total) into the skin daily.   furosemide 20 MG tablet Commonly known as:  LASIX Take 20 mg by mouth daily.   HYDROcodone-acetaminophen 5-325 MG tablet Commonly known as:  NORCO/VICODIN Take 1 tablet by mouth every 6 (six) hours as needed for moderate pain.   levothyroxine 25 MCG tablet Commonly known as:  SYNTHROID,  LEVOTHROID Take 25 mcg by mouth daily before breakfast.   liothyronine 25 MCG tablet Commonly known as:  CYTOMEL Take by mouth daily.   metFORMIN 500 MG tablet Commonly known as:  GLUCOPHAGE Take by mouth 2 (two) times daily with a meal.   omeprazole 40 MG capsule Commonly known as:  PRILOSEC TAKE ONE CAPSULE BY MOUTH DAILY   pravastatin 20 MG tablet Commonly known as:  PRAVACHOL Take 20 mg by mouth.   PROAIR HFA 108 (90 Base) MCG/ACT inhaler Generic drug:  albuterol USE 2 PUFFS TWICE DAILY AS NEEDED   raloxifene 60 MG tablet Commonly known as:  EVISTA TAKE ONE (1) TABLET BY MOUTH EVERY DAY   silver sulfADIAZINE 1 % cream Commonly known as:  SILVADENE Apply 1 application topically 2 (two) times daily.   vitamin E 1000 UNIT capsule Take 1,000 Units by mouth daily.   zolpidem 5 MG tablet Commonly known as:  AMBIEN Take 5 mg by mouth at bedtime as needed for sleep.          Total Time in preparing paper work, data evaluation and todays exam - 35 minutes  Auburn Bilberry M.D on 07/20/2016 at 1:13 PM  [facility]   Office  [phone]

## 2016-07-20 NOTE — NC FL2 (Signed)
[place] MEDICAID FL2 LEVEL OF CARE SCREENING TOOL     IDENTIFICATION  Patient Name: Bethany Joseph Birthdate: 1945/06/03 Sex: female Admission Date (Current Location): 07/19/2016  [place] and [place] Number:  Chiropodist and Address:  [facility], [address] [place], [place] [address]      Provider Number: [id number]  Attending Physician Name and Address:  Auburn Bilberry, MD  Relative Name and Phone Number:       Current Level of Care: Hospital Recommended Level of Care: Skilled Nursing Facility Prior Approval Number:    Date Approved/Denied:   PASRR Number:  ([id number] )  Discharge Plan: SNF    Current Diagnoses: Patient Active Problem List   Diagnosis Date Noted  . Fracture of multiple pubic rami (HCC) 07/19/2016  . Leg pain 06/25/2016  . Varicose veins of lower extremities with ulcer (HCC) 03/26/2016  . Chronic venous insufficiency 03/26/2016  . Essential hypertension 03/26/2016  . Type 2 diabetes mellitus (HCC) 03/26/2016  . COPD (chronic obstructive pulmonary disease) (HCC) 03/26/2016    Orientation RESPIRATION BLADDER Height & Weight     Self, Time, Situation, Place  Normal Incontinent Weight: 165 lb (74.8 kg) Height:  5\' 7"  (170.2 cm)  BEHAVIORAL SYMPTOMS/MOOD NEUROLOGICAL BOWEL NUTRITION STATUS   (none)  (none) Continent Diet (Diet: Heart Healthy )  AMBULATORY STATUS COMMUNICATION OF NEEDS Skin   Extensive Assist Verbally Normal                       Personal Care Assistance Level of Assistance  Bathing, Feeding, Dressing Bathing Assistance: Limited assistance Feeding assistance: Independent Dressing Assistance: Limited assistance     Functional Limitations Info  Sight, Hearing, Speech Sight Info: Adequate Hearing Info: Adequate Speech Info: Adequate    SPECIAL CARE FACTORS FREQUENCY  PT (By licensed PT), OT (By licensed OT)     PT Frequency:  (5) OT Frequency:  (5)             Contractures      Additional Factors Info  Code Status, Allergies Code Status Info:  (Full Code. ) Allergies Info:  (Codeine, Latex, Neosporin Neomycin-polymyxin-gramicidin, Penicillins)           Current Medications (07/20/2016):  This is the current hospital active medication list Current Facility-Administered Medications  Medication Dose Route Frequency Provider Last Rate Last Dose  . 0.9 %  sodium chloride infusion   Intravenous Continuous Ramonita Lab, MD 75 mL/hr at 07/19/16 2045    . acetaminophen (TYLENOL) tablet 650 mg  650 mg Oral Q6H PRN Gouru, Aruna, MD       Or  . acetaminophen (TYLENOL) suppository 650 mg  650 mg Rectal Q6H PRN Gouru, Aruna, MD      . albuterol (PROVENTIL) (2.5 MG/3ML) 0.083% nebulizer solution 2.5 mg  2.5 mg Inhalation Q4H PRN Gouru, Aruna, MD      . aspirin tablet 325 mg  325 mg Oral Daily Gouru, Aruna, MD   325 mg at 07/20/16 0813  . digoxin (LANOXIN) tablet 0.125 mg  0.125 mg Oral Daily Gouru, Aruna, MD   0.125 mg at 07/20/16 0823  . enoxaparin (LOVENOX) injection 40 mg  40 mg Subcutaneous Q24H Gouru, Aruna, MD   40 mg at 07/19/16 2245  . furosemide (LASIX) tablet 20 mg  20 mg Oral Daily Gouru, Aruna, MD   20 mg at 07/20/16 0813  . HYDROcodone-acetaminophen (NORCO/VICODIN) 5-325 MG per tablet 1 tablet  1 tablet Oral Q6H PRN  Ramonita LabGouru, Aruna, MD   1 tablet at 07/20/16 0813  . ketorolac (TORADOL) 15 MG/ML injection 15 mg  15 mg Intravenous Q6H PRN Gouru, Aruna, MD   15 mg at 07/19/16 2045  . levothyroxine (SYNTHROID, LEVOTHROID) tablet 25 mcg  25 mcg Oral QAC breakfast Gouru, Aruna, MD      . metFORMIN (GLUCOPHAGE) tablet 500 mg  500 mg Oral BID WC Gouru, Aruna, MD   500 mg at 07/20/16 0813  . pantoprazole (PROTONIX) EC tablet 40 mg  40 mg Oral Daily Gouru, Aruna, MD   40 mg at 07/20/16 0813  . pravastatin (PRAVACHOL) tablet 20 mg  20 mg Oral q1800 Gouru, Aruna, MD      . raloxifene (EVISTA) tablet 60 mg  60 mg Oral Daily Gouru, Aruna, MD   60 mg at  07/20/16 0813  . traMADol (ULTRAM) tablet 50 mg  50 mg Oral Q6H PRN Gouru, Aruna, MD   50 mg at 07/20/16 0315  . vitamin E capsule 1,000 Units  1,000 Units Oral Daily Gouru, Aruna, MD   1,000 Units at 07/20/16 0813  . zolpidem (AMBIEN) tablet 5 mg  5 mg Oral QHS PRN Hugelmeyer, Alexis, DO   5 mg at 07/19/16 2245     Discharge Medications: Please see discharge summary for a list of discharge medications.  Relevant Imaging Results:  Relevant Lab Results:   Additional Information  (SSN: [id number])  Ira Busbin, [name], LCSW

## 2016-07-20 NOTE — Evaluation (Signed)
Physical Therapy Evaluation Patient Details Name: Derrick Orris MRN: [id number] DOB: [birth year] Today's Date: 07/20/2016   History of Present Illness  Pt is a 71 y.o. female with a known history of Atrial fibrillation, diabetes mellitus, hyperlipidemia, hypothyroidism, COPD came into the ED after she sustained a fall. Patient was reporting severe left hip pain and pain in the left forearm. X-ray has revealed left superior and inferior pubic ramus fracture. Patient also has a large hematoma and ecchymosis to the left forearm and bruises on the extremities. ED physician has consulted orthopedics Dr. Joice Lofts who has recommended no surgical intervention.      Clinical Impression  Pt presents with deficits in strength, transfers, mobility, gait, balance, and activity tolerance.  Pt able to perform sup to/from sit bed mobility with SBA but required substantially increased time and effort to complete tasks.  Pt able to perform sit to/from stand transfers with min A from elevated EOB.  Pt limited by pain with amb attempts but was able to take one small step forward/backward from EOB but was not able to fully clear R foot from floor and instead shuffled the R foot.  Pt will benefit from PT services in a SNF setting upon discharge to address above deficits for an eventual safe return to her prior living situation with decreased caregiver assistance and decreased fall risk.      Follow Up Recommendations SNF    Equipment Recommendations  None recommended by PT    Recommendations for Other Services       Precautions / Restrictions Precautions Precautions: Fall Restrictions Weight Bearing Restrictions:  (No Weight Bearing restrictions noted in chart.)      Mobility  Bed Mobility Overal bed mobility: Needs Assistance Bed Mobility: Supine to Sit;Sit to Supine     Supine to sit: Supervision Sit to supine: Supervision   General bed mobility comments: Significantly increased effort and time  required to complete bed mobility tasks  Transfers Overall transfer level: Needs assistance Equipment used: Rolling walker (2 wheeled) Transfers: Sit to/from Stand Sit to Stand: Min assist         General transfer comment: Transfers performed from elevated EOB  Ambulation/Gait Ambulation/Gait assistance: Min guard Ambulation Distance (Feet): 2 Feet Assistive device: Rolling walker (2 wheeled) Gait Pattern/deviations: Step-to pattern   Gait velocity interpretation: Below normal speed for age/gender General Gait Details: Pt unable to fully clear RLE from floor during step forward/backward from EOB and instead shuffled the R foot on the floor.  Stairs            Wheelchair Mobility    Modified Rankin (Stroke Patients Only)       Balance Overall balance assessment: Needs assistance Sitting-balance support: No upper extremity supported;Feet supported Sitting balance-Leahy Scale: Normal     Standing balance support: Bilateral upper extremity supported Standing balance-Leahy Scale: Good                               Pertinent Vitals/Pain Pain Assessment: 0-10 Pain Score: 7  Pain Descriptors / Indicators: Aching;Sore Pain Intervention(s): Premedicated before session;Monitored during session;Limited activity within patient's tolerance    Home Living Family/patient expects to be discharged to:: Private residence Living Arrangements: Spouse/significant other Available Help at Discharge: Family;Available PRN/intermittently Type of Home: House Home Access: Stairs to enter Entrance Stairs-Rails: Right;Left;Can reach both Entrance Stairs-Number of Steps: 2 steps with B rails and then one step without rails Home Layout: Two level;Able to live  on main level with bedroom/bathroom Home Equipment: Cane - single point;Walker - 4 wheels;Walker - 2 wheels;Crutches;Wheelchair - manual      Prior Function Level of Independence: Independent with assistive  device(s);Independent         Comments: Ind Amb in home without AD, Mod I amb with SPC in community, no other fall history in the last year, Ind with ADLs     Hand Dominance   Dominant Hand: Right    Extremity/Trunk Assessment   Upper Extremity Assessment Upper Extremity Assessment: Overall WFL for tasks assessed    Lower Extremity Assessment Lower Extremity Assessment: Generalized weakness       Communication   Communication: No difficulties  Cognition Arousal/Alertness: Awake/alert Behavior During Therapy: WFL for tasks assessed/performed Overall Cognitive Status: Within Functional Limits for tasks assessed                                        General Comments      Exercises Total Joint Exercises Ankle Circles/Pumps: Strengthening;Both;5 reps Quad Sets: Strengthening;Both;5 reps Gluteal Sets: Strengthening;Both;10 reps Heel Slides: AROM;Right;10 reps Hip ABduction/ADduction: AROM;Right;5 reps Long Arc Quad: AROM;Both;5 reps Knee Flexion: AROM;Both;5 reps Other Exercises Other Exercises: HEP education/review with B APs, QS, and GS x 10 each 5-6x/day   Assessment/Plan    PT Assessment Patient needs continued PT services  PT Problem List Decreased strength;Decreased activity tolerance;Decreased balance;Decreased knowledge of use of DME;Decreased mobility       PT Treatment Interventions DME instruction;Gait training;Functional mobility training;Neuromuscular re-education;Balance training;Therapeutic exercise;Therapeutic activities;Patient/family education;Stair training    PT Goals (Current goals can be found in the Care Plan section)  Acute Rehab PT Goals Patient Stated Goal: To walk better PT Goal Formulation: With patient Time For Goal Achievement: 08/02/16 Potential to Achieve Goals: Good    Frequency 7X/week   Barriers to discharge        Co-evaluation               AM-PAC PT "6 Clicks" Daily Activity  Outcome  Measure Difficulty turning over in bed (including adjusting bedclothes, sheets and blankets)?: A Lot Difficulty moving from lying on back to sitting on the side of the bed? : A Lot Difficulty sitting down on and standing up from a chair with arms (e.g., wheelchair, bedside commode, etc,.)?: Total Help needed moving to and from a bed to chair (including a wheelchair)?: Total Help needed walking in hospital room?: Total Help needed climbing 3-5 steps with a railing? : Total 6 Click Score: 8    End of Session Equipment Utilized During Treatment: Gait belt Activity Tolerance: Patient limited by pain Patient left: in bed;with bed alarm set;with call bell/phone within reach;with family/visitor present Nurse Communication: Mobility status PT Visit Diagnosis: Difficulty in walking, not elsewhere classified (R26.2);Muscle weakness (generalized) (M62.81)    Time: 1191-4782 PT Time Calculation (min) (ACUTE ONLY): 43 min   Charges:   PT Evaluation $PT Eval Low Complexity: 1 Procedure PT Treatments $Therapeutic Exercise: 8-22 mins   PT G Codes:   PT G-Codes **NOT FOR INPATIENT CLASS** Functional Assessment Tool Used: AM-PAC 6 Clicks Basic Mobility Functional Limitation: Mobility: Walking and moving around Mobility: Walking and Moving Around Current Status (N5621): At least 80 percent but less than 100 percent impaired, limited or restricted Mobility: Walking and Moving Around Goal Status [phone]): At least 20 percent but less than 40 percent impaired, limited or restricted  Ovidio Hanger. Scott Nigil Braman PT, DPT 07/20/16, 12:33 PM

## 2016-07-20 NOTE — Clinical Social Work Placement (Signed)
   CLINICAL SOCIAL WORK PLACEMENT  NOTE  Date:  07/20/2016  Patient Details  Name: Bethany Joseph MRN: [id number] Date of Birth: 03/24/1945  Clinical Social Work is seeking post-discharge placement for this patient at the Skilled  Nursing Facility level of care (*CSW will initial, date and re-position this form in  chart as items are completed):  Yes   Patient/family provided with [facility] Clinical Social Work Department's list of facilities offering this level of care within the geographic area requested by the patient (or if unable, by the patient's family).  Yes   Patient/family informed of their freedom to choose among providers that offer the needed level of care, that participate in Medicare, Medicaid or managed care program needed by the patient, have an available bed and are willing to accept the patient.  Yes   Patient/family informed of [facility]'s ownership interest in [facility] and [facility], as well as of the fact that they are under no obligation to receive care at these facilities.  PASRR submitted to EDS on       PASRR number received on       Existing PASRR number confirmed on 07/20/16     FL2 transmitted to all facilities in geographic area requested by pt/family on 07/20/16     FL2 transmitted to all facilities within larger geographic area on       Patient informed that his/her managed care company has contracts with or will negotiate with certain facilities, including the following:        Yes   Patient/family informed of bed offers received.  Patient chooses bed at  [facility] )     Physician recommends and patient chooses bed at      Patient to be transferred to  [facility] ) on 07/20/16.  Patient to be transferred to facility by  [facility] )     Patient family notified on 07/20/16 of transfer.  Name of family member notified:   (Patient's husband [name] is aware of D/C today. )     PHYSICIAN        Additional Comment:    _______________________________________________ [name], [name], LCSW 07/20/2016, 1:59 PM

## 2016-07-20 NOTE — Progress Notes (Signed)
[facility] removed, belongings packed, and NT prepared patient for transportation. EMS called.

## 2016-07-20 NOTE — Clinical Social Work Note (Signed)
Clinical Social Work Assessment  Patient Details  Name: Bethany Joseph MRN: [id number] Date of Birth: 1945/06/08  Date of referral:  07/20/16               Reason for consult:  Facility Placement                Permission sought to share information with:  Chartered certified accountant granted to share information::  Yes, Verbal Permission Granted  Name::      [place]::   [place]   Relationship::     Contact Information:     Housing/Transportation Living arrangements for the past 2 months:  [place] of Information:  Patient, Spouse Patient Interpreter Needed:  None Criminal Activity/Legal Involvement Pertinent to Current Situation/Hospitalization:  No - Comment as needed Significant Relationships:  Adult Children, Other Family Members, Spouse Lives with:  Spouse Do you feel safe going back to the place where you live?  Yes Need for family participation in patient care:  Yes (Comment)  Care giving concerns:  Patient lives in [place] with her husband Bethany Joseph.     Social Worker assessment / plan:  Holiday representative (CSW) received SNF consult. PT is recommending SNF. CSW met with patient alone at bedside to discuss D/C plan. Patient was alert and oriented X4 and was sitting up in the bed. CSW introduced self and explained role of CSW department. Patient reported that she lives in [place] with her husband. CSW explained SNF process. Patient is agreeable to SNF search in [facility]. FL2 complete and faxed out. CSW presented bed offers to patient and she chose [facility].   Patient is medically stable for D/C to [facility] today. Per [facility] admissions coordinator at [facility] patient can come today to room 208-B. RN will call report to [phone] and arrange EMS for transport. CSW sent D/C orders to [facility] via [place]. Patient is aware of above and agreeable to a semi-private room at [facility].  Patient's 72 y.o granddaughter is at bedside and aware of above. CSW contacted patient's husband Bethany Joseph and made him aware of above. Please reconsult if future social work needs arise. CSW signing off.   Employment status:  Retired Nurse, adult PT Recommendations:  [place] / Referral to community resources:  [place]  Patient/Family's Response to care:  Patient is agreeable to go to [facility] today.   Patient/Family's Understanding of and Emotional Response to Diagnosis, Current Treatment, and Prognosis:  Patient and her family were very pleasant and thanked CSW for assistance.   Emotional Assessment Appearance:  Appears stated age Attitude/Demeanor/Rapport:    Affect (typically observed):  Accepting, Adaptable, Pleasant Orientation:  Oriented to Self, Oriented to Place, Oriented to  Time, Oriented to Situation Alcohol / Substance use:  Not Applicable Psych involvement (Current and /or in the community):  No (Comment)  Discharge Needs  Concerns to be addressed:  Discharge Planning Concerns Readmission within the last 30 days:  No Current discharge risk:  Dependent with Mobility Barriers to Discharge:  No Barriers Identified   Jerrol Helmers, Veronia Beets, LCSW 07/20/2016, 1:59 PM

## 2016-07-20 NOTE — Discharge Instructions (Signed)
[facility] - [place] at [facility]  DIET:  Cardiac diet  DISCHARGE CONDITION:  Stable  ACTIVITY:  Activity as tolerated  OXYGEN:  Home Oxygen: No.   Oxygen Delivery: room air  DISCHARGE LOCATION:  home    ADDITIONAL DISCHARGE INSTRUCTION: to snf   If you experience worsening of your admission symptoms, develop shortness of breath, life threatening emergency, suicidal or homicidal thoughts you must seek medical attention immediately by calling 911 or calling your MD immediately  if symptoms less severe.  You Must read complete instructions/literature along with all the possible adverse reactions/side effects for all the Medicines you take and that have been prescribed to you. Take any new Medicines after you have completely understood and accpet all the possible adverse reactions/side effects.   Please note  You were cared for by a hospitalist during your hospital stay. If you have any questions about your discharge medications or the care you received while you were in the hospital after you are discharged, you can call the unit and asked to speak with the hospitalist on call if the hospitalist that took care of you is not available. Once you are discharged, your primary care physician will handle any further medical issues. Please note that NO REFILLS for any discharge medications will be authorized once you are discharged, as it is imperative that you return to your primary care physician (or establish a relationship with a primary care physician if you do not have one) for your aftercare needs so that they can reassess your need for medications and monitor your lab values.

## 2016-07-20 NOTE — Progress Notes (Signed)
EMS is here to transport patient. VSS at time of discharge.

## 2016-07-24 DIAGNOSIS — K5903 Drug induced constipation: Secondary | ICD-10-CM | POA: Insufficient documentation

## 2016-07-24 DIAGNOSIS — I4819 Other persistent atrial fibrillation: Secondary | ICD-10-CM | POA: Insufficient documentation

## 2016-07-24 DIAGNOSIS — M8000XD Age-related osteoporosis with current pathological fracture, unspecified site, subsequent encounter for fracture with routine healing: Secondary | ICD-10-CM | POA: Insufficient documentation

## 2016-07-24 DIAGNOSIS — M810 Age-related osteoporosis without current pathological fracture: Secondary | ICD-10-CM | POA: Insufficient documentation

## 2016-08-03 ENCOUNTER — Non-Acute Institutional Stay (SKILLED_NURSING_FACILITY): Payer: Medicare Other | Admitting: Gerontology

## 2016-08-03 DIAGNOSIS — S32599D Other specified fracture of unspecified pubis, subsequent encounter for fracture with routine healing: Secondary | ICD-10-CM

## 2016-08-03 DIAGNOSIS — S32509D Unspecified fracture of unspecified pubis, subsequent encounter for fracture with routine healing: Secondary | ICD-10-CM

## 2016-08-06 ENCOUNTER — Ambulatory Visit (INDEPENDENT_AMBULATORY_CARE_PROVIDER_SITE_OTHER): Payer: Medicare Other | Admitting: Vascular Surgery

## 2016-08-13 NOTE — Progress Notes (Signed)
Location:      Place of Service:  SNF (31)  Provider: Lorenso QuarryShannon Asuzena Weis, NP-C  PCP: [name], Whitman L, MD Patient Care Team: [name], Whitman L, MD as PCP - General (Internal Medicine)  Extended Emergency Contact Information Primary Emergency Contact: Creppel,Robert Address: [address]          [place], [place] [address] [place] Home Phone: [phone] Relation: Spouse Secondary Emergency Contact: Beacher MayParrish,Rebecca  [place] Home Phone: [phone] Relation: Daughter  Code Status: full Goals of care:  Advanced Directive information Advanced Directives 07/20/2016  Does Patient Have a Medical Advance Directive? Yes  Type of Advance Directive Healthcare Power of Attorney  Does patient want to make changes to medical advance directive? No - Patient declined  Copy of Healthcare Power of Attorney in Chart? No - copy requested     Allergies  Allergen Reactions  . Codeine   . Latex   . Neosporin [Neomycin-Polymyxin-Gramicidin]   . Penicillins     Chief Complaint  Patient presents with  . Discharge Note    HPI:  71 y.o. female seen today for discharge evaluation. Pt was admitted to the facility for rehab following hospitalization for multiple pelvic fractures after a fall at home. Pt has been participating in PT and OT while here. She mobilizes herself mostly in the wheelchair, but ambulates with the walker. Pt reports her pain is controlled with current regimen although she states "it will be more controlled once I get home and am able to control my own medications. This isn't my first rodeo." Pt is not happy with the food, but states her appetite will improve once home. Pt was not interested in a lengthy conversation regarding her discharge plans and current state of health. Pt denies n/v/d/f/c/cp/sob/ha/abd pain/dizziness/cough. VSS. No other complaints. Pt is looking forward to discharging home in the morning.      Past Medical History:    Diagnosis Date  . Allergic rhinitis   . Aortic valve disorder   . Asthma   . Atrial fibrillation (HCC)   . Avascular necrosis (HCC)   . Diabetes (HCC)   . Hyperlipidemia   . Hyperthyroidism   . Osteoarthritis   . Osteoporosis     Past Surgical History:  Procedure Laterality Date  . ABDOMINAL HYSTERECTOMY    . CHOLECYSTECTOMY    . ORIF FIBULA FRACTURE    . ORIF HUMERUS FRACTURE    . REPLACEMENT TOTAL KNEE BILATERAL        reports that she has quit smoking. She has never used smokeless tobacco. She reports that she drinks about 8.4 oz of alcohol per week . She reports that she does not use drugs. Social History   Social History  . Marital status: Married    Spouse name: N/A  . Number of children: N/A  . Years of education: N/A   Occupational History  . Not on file.   Social History Main Topics  . Smoking status: Former Games developermoker  . Smokeless tobacco: Never Used  . Alcohol use 8.4 oz/week    14 Glasses of wine per week  . Drug use: No  . Sexual activity: Not Currently   Other Topics Concern  . Not on file   Social History Narrative  . No narrative on file   Functional Status Survey:    Allergies  Allergen Reactions  . Codeine   . Latex   . Neosporin [Neomycin-Polymyxin-Gramicidin]   . Penicillins  Pertinent  Health Maintenance Due  Topic Date Due  . HEMOGLOBIN A1C  January 18, 1946  . FOOT EXAM  05/15/1954  . OPHTHALMOLOGY EXAM  05/15/1954  . URINE MICROALBUMIN  05/15/1954  . MAMMOGRAM  05/15/1994  . COLONOSCOPY  05/15/1994  . DEXA SCAN  05/14/2009  . PNA vac Low Risk Adult (1 of 2 - PCV13) 05/14/2009  . INFLUENZA VACCINE  09/12/2016    Medications: Allergies as of 08/03/2016      Reactions   Codeine    Latex    Neosporin [neomycin-polymyxin-gramicidin]    Penicillins       Medication List       Accurate as of 08/03/16 11:59 PM. Always use your most recent med list.          aspirin 325 MG tablet Take 325 mg by mouth daily.    cholecalciferol 400 units Tabs tablet Commonly known as:  VITAMIN D Take 400 Units by mouth.   digoxin 0.125 MG tablet Commonly known as:  LANOXIN Take 0.125 mg by mouth daily.   enoxaparin 40 MG/0.4ML injection Commonly known as:  LOVENOX Inject 0.4 mLs (40 mg total) into the skin daily.   furosemide 20 MG tablet Commonly known as:  LASIX Take 20 mg by mouth daily.   HYDROcodone-acetaminophen 5-325 MG tablet Commonly known as:  NORCO/VICODIN Take 1 tablet by mouth every 6 (six) hours as needed for moderate pain.   levothyroxine 25 MCG tablet Commonly known as:  SYNTHROID, LEVOTHROID Take 25 mcg by mouth daily before breakfast.   liothyronine 25 MCG tablet Commonly known as:  CYTOMEL Take by mouth daily.   metFORMIN 500 MG tablet Commonly known as:  GLUCOPHAGE Take by mouth 2 (two) times daily with a meal.   omeprazole 40 MG capsule Commonly known as:  PRILOSEC TAKE ONE CAPSULE BY MOUTH DAILY   pravastatin 20 MG tablet Commonly known as:  PRAVACHOL Take 20 mg by mouth.   PROAIR HFA 108 (90 Base) MCG/ACT inhaler Generic drug:  albuterol USE 2 PUFFS TWICE DAILY AS NEEDED   raloxifene 60 MG tablet Commonly known as:  EVISTA TAKE ONE (1) TABLET BY MOUTH EVERY DAY   silver sulfADIAZINE 1 % cream Commonly known as:  SILVADENE Apply 1 application topically 2 (two) times daily.   vitamin E 1000 UNIT capsule Take 1,000 Units by mouth daily.   zolpidem 5 MG tablet Commonly known as:  AMBIEN Take 5 mg by mouth at bedtime as needed for sleep.       Review of Systems  Constitutional: Negative for activity change, appetite change, chills, diaphoresis and fever.  HENT: Negative for congestion, sneezing, sore throat, trouble swallowing and voice change.   Respiratory: Negative for apnea, cough, choking, chest tightness, shortness of breath and wheezing.   Cardiovascular: Negative for chest pain, palpitations and leg swelling.  Gastrointestinal: Negative for  abdominal distention, abdominal pain, constipation, diarrhea and nausea.  Genitourinary: Negative for difficulty urinating, dysuria, frequency and urgency.  Musculoskeletal: Positive for arthralgias (typical arthritis) and gait problem. Negative for back pain and myalgias.  Skin: Negative for color change, pallor, rash and wound.  Neurological: Negative for dizziness, tremors, syncope, speech difficulty, weakness, numbness and headaches.  Psychiatric/Behavioral: Negative for agitation and behavioral problems.  All other systems reviewed and are negative.   Vitals:   08/03/16 2050  BP: (!) 152/85  Pulse: 63  Resp: 20  Temp: 98.1 F (36.7 C)  SpO2: 100%   There is no height or weight on file  to calculate BMI. Physical Exam  Constitutional: She is oriented to person, place, and time. Vital signs are normal. She appears well-developed and well-nourished. She is active and cooperative. She does not appear ill. No distress.  HENT:  Head: Normocephalic and atraumatic.  Mouth/Throat: Uvula is midline, oropharynx is clear and moist and mucous membranes are normal. Mucous membranes are not pale, not dry and not cyanotic.  Eyes: Conjunctivae, EOM and lids are normal. Pupils are equal, round, and reactive to light.  Neck: Trachea normal, normal range of motion and full passive range of motion without pain. Neck supple. No JVD present. No tracheal deviation, no edema and no erythema present. No thyromegaly present.  Cardiovascular: Normal rate, regular rhythm, normal heart sounds, intact distal pulses and normal pulses.  Exam reveals no gallop, no distant heart sounds and no friction rub.   No murmur heard. Pulmonary/Chest: Effort normal. No accessory muscle usage. No respiratory distress. She has no wheezes. She has no rales. She exhibits no tenderness.  Abdominal: Normal appearance and bowel sounds are normal. She exhibits no distension and no ascites. There is no tenderness.  Musculoskeletal:  Normal range of motion. She exhibits no edema or tenderness.  Expected osteoarthritis, stiffness; Calves soft, supple, Negative Homan's sign. Pelvic pain  Neurological: She is alert and oriented to person, place, and time. She has normal strength.  Skin: Skin is warm, dry and intact. No rash noted. She is not diaphoretic. No cyanosis or erythema. No pallor. Nails show no clubbing.  Psychiatric: She has a normal mood and affect. Her speech is normal and behavior is normal. Judgment and thought content normal. Cognition and memory are normal.  Irritable, non-sociable  Nursing note and vitals reviewed.   Labs reviewed: Basic Metabolic Panel:  Recent Labs  96/04/54 1458 07/20/16 0530  NA 137 139  K 4.3 3.9  CL 101 106  CO2 27 28  GLUCOSE 123* 125*  BUN 18 18  CREATININE 0.61 0.73  CALCIUM 9.5 8.7*   Liver Function Tests:  Recent Labs  07/19/16 1458 07/20/16 0530  AST 35 22  ALT 21 16  ALKPHOS 79 64  BILITOT 1.0 0.8  PROT 8.0 6.7  ALBUMIN 4.2 3.4*   No results for input(s): LIPASE, AMYLASE in the last 8760 hours. No results for input(s): AMMONIA in the last 8760 hours. CBC:  Recent Labs  07/19/16 1458 07/20/16 0530  WBC 6.5 4.9  NEUTROABS 4.4  --   HGB 11.9* 11.3*  HCT 34.7* 33.1*  MCV 91.7 93.2  PLT 164 136*   Cardiac Enzymes:  Recent Labs  07/19/16 1458  TROPONINI <0.03   BNP: Invalid input(s): POCBNP CBG: No results for input(s): GLUCAP in the last 8760 hours.  Procedures and Imaging Studies During Stay: Dg Forearm Left  Result Date: 07/19/2016 CLINICAL DATA:  Fall EXAM: LEFT FOREARM - 2 VIEW COMPARISON:  None. FINDINGS: Posterior plate fusion of the distal humerus with callus formation. No fracture of the radius or ulna on single view. Degenerate spurring of the radial head. Small effusion anterior to the distal humerus IMPRESSION: 1. Small anterior effusion at the elbow joint. 2. No evidence of acute fracture Electronically Signed   By: Genevive Bi M.D.   On: 07/19/2016 14:30   Dg Hand Complete Right  Result Date: 07/19/2016 CLINICAL DATA:  Right hand pain after fall. EXAM: RIGHT HAND - COMPLETE 3+ VIEW COMPARISON:  None. FINDINGS: There is no evidence of fracture or dislocation. Severe narrowing and sclerosis of the  first carpal/metacarpal joint is noted. Narrowing and osteophyte formation is seen involving the fifth distal interphalangeal joint. Narrowing of several intercarpal joints is noted as well. Soft tissues are unremarkable. IMPRESSION: Degenerative joint disease as described above. No acute abnormality seen in the right hand. Electronically Signed   By: Lupita Raider, M.D.   On: 07/19/2016 14:33   Dg Hip Unilat W Or Wo Pelvis 2-3 Views Left  Result Date: 07/19/2016 CLINICAL DATA:  Left hip pain since a fall this morning. History of prior hip fracture. Initial encounter. EXAM: DG HIP (WITH OR WITHOUT PELVIS) 2-3V LEFT COMPARISON:  None. FINDINGS: Nondisplaced high left superior pubic ramus fracture is identified. There is also a nondisplaced fracture of the left inferior pubic ramus. No other acute bony or joint abnormality is seen. Healed left hip fracture with a dynamic hip screw in place noted. Bones are osteopenic. Lower lumbar spondylosis is seen. Soft tissues are unremarkable. IMPRESSION: Nondisplaced left superior and inferior pubic ramus fractures. Negative for acute hip fracture. Remote healed hip fracture with a dynamic hip screw in place noted. Osteopenia. Electronically Signed   By: Drusilla Kanner M.D.   On: 07/19/2016 14:29    Assessment/Plan:   1. Closed fracture of multiple pubic rami with routine healing, subsequent encounter, unspecified laterality  Continue PT/OT at home  Continue exercises as taught by PT/OT  Continue Tylenol 650 mg po QID for pain  Continue Oxycodone 5 mg 1-2 tablets po Q 4 hours prn pain # 20, no refill  Follow up with orthopedist asap after discharge for continuity of  care  Follow up with PCP asap after discharge for continuity of care   Patient is being discharged with the following home health services:  HHPT/OT via [facility]  Patient is being discharged with the following durable medical equipment: RW, tub-bench (pt has own at home already)    Patient has been advised to f/u with their PCP in 1-2 weeks to bring them up to date on their rehab stay.  Social services at facility was responsible for arranging this appointment.  Pt was provided with a 30 day supply of prescriptions for medications and refills must be obtained from their PCP.  For controlled substances, a more limited supply may be provided adequate until PCP appointment only.  Future labs/tests needed:    Family/ staff Communication:   Total Time:  Documentation:  Face to Face:  Family/Phone:  Brynda Rim, NP-C Geriatrics [facility] [address], [place] [address] Cell Phone (Mon-Fri 8am-5pm):  [phone] On Call:  [phone] & follow prompts after 5pm & weekends Office Phone:  [phone] Office Fax:  [phone]

## 2016-08-20 ENCOUNTER — Encounter (INDEPENDENT_AMBULATORY_CARE_PROVIDER_SITE_OTHER): Payer: Self-pay | Admitting: Vascular Surgery

## 2016-08-20 ENCOUNTER — Ambulatory Visit (INDEPENDENT_AMBULATORY_CARE_PROVIDER_SITE_OTHER): Payer: Medicare Other | Admitting: Vascular Surgery

## 2016-08-20 VITALS — BP 145/80 | HR 71 | Resp 16 | Ht 67.0 in | Wt 164.0 lb

## 2016-08-20 DIAGNOSIS — I83028 Varicose veins of left lower extremity with ulcer other part of lower leg: Secondary | ICD-10-CM

## 2016-08-20 DIAGNOSIS — I83029 Varicose veins of left lower extremity with ulcer of unspecified site: Secondary | ICD-10-CM

## 2016-08-20 DIAGNOSIS — L97929 Non-pressure chronic ulcer of unspecified part of left lower leg with unspecified severity: Secondary | ICD-10-CM | POA: Diagnosis not present

## 2016-08-20 NOTE — Progress Notes (Signed)
    MRN : [id number]  Bethany Joseph is a 71 y.o. (11/28/1945) female who presents with chief complaint of  Chief Complaint  Patient presents with  . Venous Insufficiency    Left Leg sclero  .   Procedure:  Sclerotherapy using hypertonic saline mixed with 1% Lidocaine was performed on lower extremities bilateral.  Compression wraps were placed.  The patient tolerated the procedure well.  Plan:  Follow up as arranged

## 2016-09-18 DIAGNOSIS — S32502D Unspecified fracture of left pubis, subsequent encounter for fracture with routine healing: Secondary | ICD-10-CM | POA: Insufficient documentation

## 2016-09-24 ENCOUNTER — Ambulatory Visit (INDEPENDENT_AMBULATORY_CARE_PROVIDER_SITE_OTHER): Payer: Medicare Other | Admitting: Vascular Surgery

## 2016-09-24 ENCOUNTER — Encounter (INDEPENDENT_AMBULATORY_CARE_PROVIDER_SITE_OTHER): Payer: Self-pay | Admitting: Vascular Surgery

## 2016-09-24 VITALS — BP 141/64 | HR 66 | Resp 16 | Ht 68.0 in | Wt 167.0 lb

## 2016-09-24 DIAGNOSIS — L97929 Non-pressure chronic ulcer of unspecified part of left lower leg with unspecified severity: Secondary | ICD-10-CM | POA: Diagnosis not present

## 2016-09-24 DIAGNOSIS — I83023 Varicose veins of left lower extremity with ulcer of ankle: Secondary | ICD-10-CM

## 2016-09-24 DIAGNOSIS — I83029 Varicose veins of left lower extremity with ulcer of unspecified site: Secondary | ICD-10-CM

## 2016-09-26 NOTE — Progress Notes (Signed)
    MRN : [id number]  Bethany Joseph is a 71 y.o. 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   Procedure:  Sclerotherapy using hypertonic saline mixed with 1% Lidocaine was performed on lower extremities bilateral.  Compression wraps were placed.  The patient tolerated the procedure well.  Plan:  Follow up as arranged

## 2017-07-15 ENCOUNTER — Inpatient Hospital Stay: Payer: Medicare Other

## 2017-07-15 ENCOUNTER — Inpatient Hospital Stay: Payer: Medicare Other | Admitting: Anesthesiology

## 2017-07-15 ENCOUNTER — Emergency Department: Payer: Medicare Other

## 2017-07-15 ENCOUNTER — Encounter
Admission: EM | Disposition: A | Discharge status: Skilled Nursing Facility | Payer: Self-pay | Source: Home / Self Care | Attending: Internal Medicine

## 2017-07-15 ENCOUNTER — Inpatient Hospital Stay
Admission: EM | Admit: 2017-07-15 | Discharge: 2017-07-17 | DRG: 482 | Disposition: A | Discharge status: Skilled Nursing Facility | Payer: Medicare Other | Attending: Specialist | Admitting: Specialist

## 2017-07-15 ENCOUNTER — Other Ambulatory Visit: Payer: Self-pay

## 2017-07-15 DIAGNOSIS — I482 Chronic atrial fibrillation: Secondary | ICD-10-CM | POA: Diagnosis present

## 2017-07-15 DIAGNOSIS — Z96653 Presence of artificial knee joint, bilateral: Secondary | ICD-10-CM | POA: Diagnosis present

## 2017-07-15 DIAGNOSIS — E785 Hyperlipidemia, unspecified: Secondary | ICD-10-CM | POA: Diagnosis present

## 2017-07-15 DIAGNOSIS — Z79899 Other long term (current) drug therapy: Secondary | ICD-10-CM | POA: Diagnosis not present

## 2017-07-15 DIAGNOSIS — E1142 Type 2 diabetes mellitus with diabetic polyneuropathy: Secondary | ICD-10-CM | POA: Diagnosis present

## 2017-07-15 DIAGNOSIS — E1151 Type 2 diabetes mellitus with diabetic peripheral angiopathy without gangrene: Secondary | ICD-10-CM | POA: Diagnosis present

## 2017-07-15 DIAGNOSIS — E11622 Type 2 diabetes mellitus with other skin ulcer: Secondary | ICD-10-CM

## 2017-07-15 DIAGNOSIS — Z885 Allergy status to narcotic agent status: Secondary | ICD-10-CM

## 2017-07-15 DIAGNOSIS — Z888 Allergy status to other drugs, medicaments and biological substances status: Secondary | ICD-10-CM

## 2017-07-15 DIAGNOSIS — Z880 Allergy status to penicillin: Secondary | ICD-10-CM | POA: Diagnosis not present

## 2017-07-15 DIAGNOSIS — S72141A Displaced intertrochanteric fracture of right femur, initial encounter for closed fracture: Principal | ICD-10-CM | POA: Diagnosis present

## 2017-07-15 DIAGNOSIS — M810 Age-related osteoporosis without current pathological fracture: Secondary | ICD-10-CM | POA: Diagnosis present

## 2017-07-15 DIAGNOSIS — K219 Gastro-esophageal reflux disease without esophagitis: Secondary | ICD-10-CM | POA: Diagnosis present

## 2017-07-15 DIAGNOSIS — Z7901 Long term (current) use of anticoagulants: Secondary | ICD-10-CM

## 2017-07-15 DIAGNOSIS — Z91040 Latex allergy status: Secondary | ICD-10-CM | POA: Diagnosis not present

## 2017-07-15 DIAGNOSIS — J449 Chronic obstructive pulmonary disease, unspecified: Secondary | ICD-10-CM | POA: Diagnosis present

## 2017-07-15 DIAGNOSIS — Z7984 Long term (current) use of oral hypoglycemic drugs: Secondary | ICD-10-CM

## 2017-07-15 DIAGNOSIS — S72009A Fracture of unspecified part of neck of unspecified femur, initial encounter for closed fracture: Secondary | ICD-10-CM | POA: Diagnosis present

## 2017-07-15 DIAGNOSIS — Z7982 Long term (current) use of aspirin: Secondary | ICD-10-CM

## 2017-07-15 DIAGNOSIS — I10 Essential (primary) hypertension: Secondary | ICD-10-CM | POA: Diagnosis present

## 2017-07-15 DIAGNOSIS — K5900 Constipation, unspecified: Secondary | ICD-10-CM | POA: Diagnosis not present

## 2017-07-15 DIAGNOSIS — Z87891 Personal history of nicotine dependence: Secondary | ICD-10-CM | POA: Diagnosis not present

## 2017-07-15 DIAGNOSIS — E039 Hypothyroidism, unspecified: Secondary | ICD-10-CM | POA: Diagnosis present

## 2017-07-15 DIAGNOSIS — W19XXXA Unspecified fall, initial encounter: Secondary | ICD-10-CM

## 2017-07-15 DIAGNOSIS — W1839XA Other fall on same level, initial encounter: Secondary | ICD-10-CM | POA: Diagnosis present

## 2017-07-15 DIAGNOSIS — Z419 Encounter for procedure for purposes other than remedying health state, unspecified: Secondary | ICD-10-CM

## 2017-07-15 DIAGNOSIS — S72001A Fracture of unspecified part of neck of right femur, initial encounter for closed fracture: Secondary | ICD-10-CM

## 2017-07-15 HISTORY — PX: INTRAMEDULLARY (IM) NAIL INTERTROCHANTERIC: SHX5875

## 2017-07-15 LAB — BASIC METABOLIC PANEL
Anion gap: 12 (ref 5–15)
BUN: 21 mg/dL — ABNORMAL HIGH (ref 6–20)
CALCIUM: 8.8 mg/dL — AB (ref 8.9–10.3)
CO2: 22 mmol/L (ref 22–32)
CREATININE: 0.67 mg/dL (ref 0.44–1.00)
Chloride: 103 mmol/L (ref 101–111)
GFR calc non Af Amer: >60 mL/min (ref >60)
Glucose, Bld: 132 mg/dL — ABNORMAL HIGH (ref 65–99)
Potassium: 4.2 mmol/L (ref 3.5–5.1)
Sodium: 137 mmol/L (ref 135–145)

## 2017-07-15 LAB — TSH: TSH: <0.01 u[IU]/mL — ABNORMAL LOW (ref 0.350–4.500)

## 2017-07-15 LAB — CBC
HEMATOCRIT: 34.2 % — AB (ref 35.0–47.0)
Hemoglobin: 11.5 g/dL — ABNORMAL LOW (ref 12.0–16.0)
MCH: 31.3 pg (ref 26.0–34.0)
MCHC: 33.6 g/dL (ref 32.0–36.0)
MCV: 93.1 fL (ref 80.0–100.0)
Platelets: 176 10*3/uL (ref 150–440)
RBC: 3.67 MIL/uL — ABNORMAL LOW (ref 3.80–5.20)
RDW: 15.7 % — AB (ref 11.5–14.5)
WBC: 4.5 10*3/uL (ref 3.6–11.0)

## 2017-07-15 LAB — GLUCOSE, CAPILLARY
Glucose-Capillary: 108 mg/dL — ABNORMAL HIGH (ref 65–99)
Glucose-Capillary: 109 mg/dL — ABNORMAL HIGH (ref 65–99)
Glucose-Capillary: 123 mg/dL — ABNORMAL HIGH (ref 65–99)
Glucose-Capillary: 180 mg/dL — ABNORMAL HIGH (ref 65–99)

## 2017-07-15 LAB — SURGICAL PCR SCREEN
MRSA, PCR: NEGATIVE
Staphylococcus aureus: NEGATIVE

## 2017-07-15 LAB — HEMOGLOBIN A1C
Hgb A1c MFr Bld: 6.5 % — ABNORMAL HIGH (ref 4.8–5.6)
Mean Plasma Glucose: 139.85 mg/dL

## 2017-07-15 SURGERY — FIXATION, FRACTURE, INTERTROCHANTERIC, WITH INTRAMEDULLARY ROD
Anesthesia: General | Laterality: Right

## 2017-07-15 MED ORDER — LORATADINE 10 MG PO TABS
10.0000 mg | ORAL_TABLET | Freq: Every day | ORAL | Status: DC
  Administered 2017-07-16 – 2017-07-17 (×2): 10 mg via ORAL
  Filled 2017-07-15 (×2): qty 1

## 2017-07-15 MED ORDER — SODIUM CHLORIDE 0.9 % IV SOLN
INTRAVENOUS | Status: DC
  Administered 2017-07-15 – 2017-07-16 (×2): via INTRAVENOUS

## 2017-07-15 MED ORDER — ROCURONIUM BROMIDE 100 MG/10ML IV SOLN
INTRAVENOUS | Status: DC | PRN
  Administered 2017-07-15: 30 mg via INTRAVENOUS
  Administered 2017-07-15: 10 mg via INTRAVENOUS

## 2017-07-15 MED ORDER — ACETAMINOPHEN 650 MG RE SUPP: 650.0000 mg | Freq: Four times a day (QID) | RECTAL | Status: DC | PRN

## 2017-07-15 MED ORDER — MORPHINE SULFATE (PF) 4 MG/ML IV SOLN
4.0000 mg | Freq: Once | INTRAVENOUS | Status: AC
  Administered 2017-07-15: 4 mg via INTRAVENOUS

## 2017-07-15 MED ORDER — GENTAMICIN SULFATE 40 MG/ML IJ SOLN
INTRAMUSCULAR | Status: AC
  Filled 2017-07-15: qty 2

## 2017-07-15 MED ORDER — FENTANYL CITRATE (PF) 100 MCG/2ML IJ SOLN
INTRAMUSCULAR | Status: DC | PRN
  Administered 2017-07-15 (×4): 50 ug via INTRAVENOUS

## 2017-07-15 MED ORDER — OMEGA-3-ACID ETHYL ESTERS 1 G PO CAPS
1.0000 g | ORAL_CAPSULE | Freq: Every day | ORAL | Status: DC
  Administered 2017-07-16 – 2017-07-17 (×2): 1 g via ORAL
  Filled 2017-07-15 (×2): qty 1

## 2017-07-15 MED ORDER — OXYCODONE HCL 5 MG PO TABS
5.0000 mg | ORAL_TABLET | ORAL | Status: DC | PRN
  Administered 2017-07-15 – 2017-07-17 (×7): 5 mg via ORAL
  Filled 2017-07-15 (×8): qty 1

## 2017-07-15 MED ORDER — SODIUM CHLORIDE 0.9 % IV SOLN
INTRAVENOUS | Status: DC
  Administered 2017-07-15: 04:00:00 via INTRAVENOUS

## 2017-07-15 MED ORDER — ONDANSETRON HCL 4 MG/2ML IJ SOLN
4.0000 mg | Freq: Once | INTRAMUSCULAR | Status: AC
  Administered 2017-07-15: 4 mg via INTRAVENOUS

## 2017-07-15 MED ORDER — APIXABAN 5 MG PO TABS
5.0000 mg | ORAL_TABLET | Freq: Two times a day (BID) | ORAL | Status: DC
  Administered 2017-07-16 – 2017-07-17 (×3): 5 mg via ORAL
  Filled 2017-07-15 (×3): qty 1

## 2017-07-15 MED ORDER — ONDANSETRON HCL 4 MG/2ML IJ SOLN
4.0000 mg | Freq: Four times a day (QID) | INTRAMUSCULAR | Status: DC | PRN
  Administered 2017-07-15: 4 mg via INTRAVENOUS
  Filled 2017-07-15: qty 2

## 2017-07-15 MED ORDER — PHENOL 1.4 % MT LIQD
1.0000 | OROMUCOSAL | Status: DC | PRN
  Filled 2017-07-15: qty 177

## 2017-07-15 MED ORDER — VITAMIN E 180 MG (400 UNIT) PO CAPS
400.0000 [IU] | ORAL_CAPSULE | Freq: Three times a day (TID) | ORAL | Status: DC
  Administered 2017-07-15 – 2017-07-17 (×5): 400 [IU] via ORAL
  Filled 2017-07-15 (×9): qty 1

## 2017-07-15 MED ORDER — PROMETHAZINE HCL 25 MG/ML IJ SOLN: 6.2500 mg | INTRAMUSCULAR | Status: DC | PRN

## 2017-07-15 MED ORDER — RALOXIFENE HCL 60 MG PO TABS
60.0000 mg | ORAL_TABLET | Freq: Every day | ORAL | Status: DC
  Administered 2017-07-16 – 2017-07-17 (×2): 60 mg via ORAL
  Filled 2017-07-15 (×3): qty 1

## 2017-07-15 MED ORDER — OMEGA 3 1200 MG PO CAPS: 1200.0000 mg | ORAL_CAPSULE | Freq: Every day | ORAL | Status: DC

## 2017-07-15 MED ORDER — MENTHOL 3 MG MT LOZG
1.0000 | LOZENGE | OROMUCOSAL | Status: DC | PRN
  Filled 2017-07-15: qty 9

## 2017-07-15 MED ORDER — ACETAMINOPHEN 160 MG/5ML PO SOLN
325.0000 mg | ORAL | Status: DC | PRN
  Filled 2017-07-15: qty 20.3

## 2017-07-15 MED ORDER — MAGNESIUM CITRATE PO SOLN
1.0000 | Freq: Once | ORAL | Status: DC | PRN
  Filled 2017-07-15: qty 296

## 2017-07-15 MED ORDER — LEVOTHYROXINE SODIUM 25 MCG PO TABS
25.0000 ug | ORAL_TABLET | Freq: Every day | ORAL | Status: DC
  Administered 2017-07-16 – 2017-07-17 (×2): 25 ug via ORAL
  Filled 2017-07-15 (×2): qty 1

## 2017-07-15 MED ORDER — CYCLOBENZAPRINE HCL 10 MG PO TABS
5.0000 mg | ORAL_TABLET | Freq: Three times a day (TID) | ORAL | Status: DC | PRN
  Administered 2017-07-15 – 2017-07-17 (×7): 5 mg via ORAL
  Filled 2017-07-15 (×7): qty 1

## 2017-07-15 MED ORDER — CLINDAMYCIN PHOSPHATE 600 MG/50ML IV SOLN
600.0000 mg | Freq: Four times a day (QID) | INTRAVENOUS | Status: AC
  Administered 2017-07-15 – 2017-07-16 (×2): 600 mg via INTRAVENOUS
  Filled 2017-07-15 (×2): qty 50

## 2017-07-15 MED ORDER — ACETAMINOPHEN 325 MG PO TABS: 325.0000 mg | ORAL_TABLET | ORAL | Status: DC | PRN

## 2017-07-15 MED ORDER — ONDANSETRON HCL 4 MG PO TABS: 4.0000 mg | ORAL_TABLET | Freq: Four times a day (QID) | ORAL | Status: DC | PRN

## 2017-07-15 MED ORDER — MORPHINE SULFATE (PF) 4 MG/ML IV SOLN
4.0000 mg | INTRAVENOUS | Status: DC | PRN
Start: 2017-07-15 — End: 2017-07-15
  Administered 2017-07-15 (×2): 4 mg via INTRAVENOUS
  Filled 2017-07-15 (×2): qty 1

## 2017-07-15 MED ORDER — FUROSEMIDE 20 MG PO TABS
20.0000 mg | ORAL_TABLET | Freq: Every day | ORAL | Status: DC
  Administered 2017-07-16 – 2017-07-17 (×2): 20 mg via ORAL
  Filled 2017-07-15 (×2): qty 1

## 2017-07-15 MED ORDER — ONDANSETRON HCL 4 MG/2ML IJ SOLN
INTRAMUSCULAR | Status: DC | PRN
  Administered 2017-07-15: 4 mg via INTRAVENOUS

## 2017-07-15 MED ORDER — SODIUM CHLORIDE 0.9 % IV SOLN
INTRAVENOUS | Status: DC | PRN
  Administered 2017-07-15: 1000 mL

## 2017-07-15 MED ORDER — SENNA 8.6 MG PO TABS
1.0000 | ORAL_TABLET | Freq: Two times a day (BID) | ORAL | Status: DC
  Administered 2017-07-15 – 2017-07-17 (×4): 8.6 mg via ORAL
  Filled 2017-07-15 (×4): qty 1

## 2017-07-15 MED ORDER — GABAPENTIN 100 MG PO CAPS
100.0000 mg | ORAL_CAPSULE | Freq: Every day | ORAL | Status: DC
Start: 2017-07-15 — End: 2017-07-17
  Administered 2017-07-15 – 2017-07-16 (×2): 100 mg via ORAL
  Filled 2017-07-15 (×3): qty 1

## 2017-07-15 MED ORDER — HYDROCODONE-ACETAMINOPHEN 5-325 MG PO TABS
1.0000 | ORAL_TABLET | Freq: Four times a day (QID) | ORAL | Status: DC | PRN
  Administered 2017-07-15 – 2017-07-17 (×5): 1 via ORAL
  Filled 2017-07-15 (×5): qty 1

## 2017-07-15 MED ORDER — DIGOXIN 125 MCG PO TABS
0.1250 mg | ORAL_TABLET | Freq: Every day | ORAL | Status: DC
Start: 2017-07-15 — End: 2017-07-17
  Administered 2017-07-16 – 2017-07-17 (×2): 0.125 mg via ORAL
  Filled 2017-07-15 (×3): qty 1

## 2017-07-15 MED ORDER — PRAVASTATIN SODIUM 20 MG PO TABS
20.0000 mg | ORAL_TABLET | Freq: Every day | ORAL | Status: DC
  Administered 2017-07-15 – 2017-07-16 (×2): 20 mg via ORAL
  Filled 2017-07-15 (×2): qty 1

## 2017-07-15 MED ORDER — PANTOPRAZOLE SODIUM 40 MG PO TBEC
40.0000 mg | DELAYED_RELEASE_TABLET | Freq: Every day | ORAL | Status: DC
  Administered 2017-07-16 – 2017-07-17 (×2): 40 mg via ORAL
  Filled 2017-07-15 (×2): qty 1

## 2017-07-15 MED ORDER — INSULIN ASPART 100 UNIT/ML ~~LOC~~ SOLN
0.0000 [IU] | Freq: Three times a day (TID) | SUBCUTANEOUS | Status: DC
  Administered 2017-07-15 – 2017-07-16 (×4): 1 [IU] via SUBCUTANEOUS
  Administered 2017-07-17: 2 [IU] via SUBCUTANEOUS
  Filled 2017-07-15 (×6): qty 1

## 2017-07-15 MED ORDER — METOCLOPRAMIDE HCL 5 MG/ML IJ SOLN: 5.0000 mg | Freq: Three times a day (TID) | INTRAMUSCULAR | Status: DC | PRN

## 2017-07-15 MED ORDER — MORPHINE SULFATE (PF) 4 MG/ML IV SOLN
4.0000 mg | INTRAVENOUS | Status: DC | PRN
  Administered 2017-07-15 – 2017-07-16 (×3): 4 mg via INTRAVENOUS
  Filled 2017-07-15 (×3): qty 1

## 2017-07-15 MED ORDER — LIDOCAINE HCL (CARDIAC) PF 100 MG/5ML IV SOSY
PREFILLED_SYRINGE | INTRAVENOUS | Status: DC | PRN
  Administered 2017-07-15: 100 mg via INTRAVENOUS

## 2017-07-15 MED ORDER — SUGAMMADEX SODIUM 200 MG/2ML IV SOLN
INTRAVENOUS | Status: DC | PRN
  Administered 2017-07-15: 150 mg via INTRAVENOUS

## 2017-07-15 MED ORDER — ZOLPIDEM TARTRATE 5 MG PO TABS: 5.0000 mg | ORAL_TABLET | Freq: Every evening | ORAL | Status: DC | PRN

## 2017-07-15 MED ORDER — MORPHINE SULFATE (PF) 4 MG/ML IV SOLN
4.0000 mg | INTRAVENOUS | Status: DC | PRN
  Administered 2017-07-15: 4 mg via INTRAVENOUS
  Filled 2017-07-15: qty 1

## 2017-07-15 MED ORDER — PROPOFOL 10 MG/ML IV BOLUS
INTRAVENOUS | Status: DC | PRN
  Administered 2017-07-15: 100 mg via INTRAVENOUS

## 2017-07-15 MED ORDER — PROPOFOL 10 MG/ML IV BOLUS
INTRAVENOUS | Status: AC
  Filled 2017-07-15: qty 20

## 2017-07-15 MED ORDER — ALUM & MAG HYDROXIDE-SIMETH 200-200-20 MG/5ML PO SUSP: 30.0000 mL | ORAL | Status: DC | PRN

## 2017-07-15 MED ORDER — NEBIVOLOL HCL 2.5 MG PO TABS
2.5000 mg | ORAL_TABLET | Freq: Every day | ORAL | Status: DC | PRN
  Filled 2017-07-15: qty 1

## 2017-07-15 MED ORDER — METOCLOPRAMIDE HCL 10 MG PO TABS: 5.0000 mg | ORAL_TABLET | Freq: Three times a day (TID) | ORAL | Status: DC | PRN

## 2017-07-15 MED ORDER — MORPHINE SULFATE (PF) 4 MG/ML IV SOLN
INTRAVENOUS | Status: AC
  Filled 2017-07-15: qty 1

## 2017-07-15 MED ORDER — MEPERIDINE HCL 50 MG/ML IJ SOLN: 6.2500 mg | INTRAMUSCULAR | Status: DC | PRN

## 2017-07-15 MED ORDER — SUCCINYLCHOLINE CHLORIDE 20 MG/ML IJ SOLN
INTRAMUSCULAR | Status: DC | PRN
  Administered 2017-07-15: 100 mg via INTRAVENOUS

## 2017-07-15 MED ORDER — BISACODYL 10 MG RE SUPP
10.0000 mg | RECTAL | Status: DC | PRN
  Administered 2017-07-17: 10 mg via RECTAL
  Filled 2017-07-15: qty 1

## 2017-07-15 MED ORDER — FENTANYL CITRATE (PF) 100 MCG/2ML IJ SOLN
25.0000 ug | INTRAMUSCULAR | Status: DC | PRN
  Administered 2017-07-15: 50 ug via INTRAVENOUS

## 2017-07-15 MED ORDER — FENTANYL CITRATE (PF) 100 MCG/2ML IJ SOLN
INTRAMUSCULAR | Status: AC
Start: 2017-07-15 — End: ?
  Filled 2017-07-15: qty 2

## 2017-07-15 MED ORDER — FENTANYL CITRATE (PF) 100 MCG/2ML IJ SOLN
INTRAMUSCULAR | Status: AC
  Filled 2017-07-15: qty 2

## 2017-07-15 MED ORDER — ONDANSETRON HCL 4 MG/2ML IJ SOLN
INTRAMUSCULAR | Status: AC
  Filled 2017-07-15: qty 2

## 2017-07-15 MED ORDER — ACETAMINOPHEN 500 MG PO TABS
500.0000 mg | ORAL_TABLET | Freq: Four times a day (QID) | ORAL | Status: AC
  Administered 2017-07-15 – 2017-07-16 (×4): 500 mg via ORAL
  Filled 2017-07-15 (×4): qty 1

## 2017-07-15 MED ORDER — ACETAMINOPHEN 325 MG PO TABS
650.0000 mg | ORAL_TABLET | Freq: Four times a day (QID) | ORAL | Status: DC | PRN
  Administered 2017-07-16 – 2017-07-17 (×3): 650 mg via ORAL
  Filled 2017-07-15 (×3): qty 2

## 2017-07-15 MED ORDER — CLINDAMYCIN PHOSPHATE 600 MG/50ML IV SOLN
600.0000 mg | Freq: Once | INTRAVENOUS | Status: AC
  Administered 2017-07-15: 600 mg via INTRAVENOUS
  Filled 2017-07-15: qty 50

## 2017-07-15 MED ORDER — MAGNESIUM HYDROXIDE 400 MG/5ML PO SUSP: 30.0000 mL | Freq: Every day | ORAL | Status: DC | PRN

## 2017-07-15 MED ORDER — BISACODYL 5 MG PO TBEC: 5.0000 mg | DELAYED_RELEASE_TABLET | Freq: Every day | ORAL | Status: DC | PRN

## 2017-07-15 MED ORDER — LIOTHYRONINE SODIUM 25 MCG PO TABS
100.0000 ug | ORAL_TABLET | Freq: Every day | ORAL | Status: DC
  Administered 2017-07-16 – 2017-07-17 (×2): 100 ug via ORAL
  Filled 2017-07-15 (×4): qty 4

## 2017-07-15 MED ORDER — DOCUSATE SODIUM 100 MG PO CAPS
100.0000 mg | ORAL_CAPSULE | Freq: Two times a day (BID) | ORAL | Status: DC
  Administered 2017-07-15 – 2017-07-17 (×4): 100 mg via ORAL
  Filled 2017-07-15 (×4): qty 1

## 2017-07-15 SURGICAL SUPPLY — 29 items
BIT DRILL CANN LG 4.3MM (BIT) ×1 IMPLANT
CANISTER SUCT 1200ML W/VALVE (MISCELLANEOUS) ×3 IMPLANT
CHLORAPREP W/TINT 26ML (MISCELLANEOUS) ×3 IMPLANT
DRAPE SHEET LG 3/4 BI-LAMINATE (DRAPES) ×3 IMPLANT
DRAPE U-SHAPE 47X51 STRL (DRAPES) ×3 IMPLANT
DRILL BIT CANN LG 4.3MM (BIT) ×3
DRSG OPSITE POSTOP 3X4 (GAUZE/BANDAGES/DRESSINGS) ×9 IMPLANT
GLOVE BIOGEL PI IND STRL 9 (GLOVE) ×1 IMPLANT
GLOVE BIOGEL PI INDICATOR 9 (GLOVE) ×2
GLOVE SURG SYN 9.0  PF PI (GLOVE) ×2
GLOVE SURG SYN 9.0 PF PI (GLOVE) ×1 IMPLANT
GOWN SRG 2XL LVL 4 RGLN SLV (GOWNS) ×1 IMPLANT
GOWN STRL NON-REIN 2XL LVL4 (GOWNS) ×2
GOWN STRL REUS W/ TWL LRG LVL3 (GOWN DISPOSABLE) ×1 IMPLANT
GOWN STRL REUS W/TWL LRG LVL3 (GOWN DISPOSABLE) ×2
GUIDEPIN VERSANAIL DSP 3.2X444 (ORTHOPEDIC DISPOSABLE SUPPLIES) ×3 IMPLANT
HFN 130 DEG 13MM X 180MM (Nail) ×3 IMPLANT
KIT TURNOVER KIT A (KITS) ×3 IMPLANT
MAT BLUE FLOOR 46X72 FLO (MISCELLANEOUS) ×3 IMPLANT
NEEDLE FILTER BLUNT 18X 1/2SAF (NEEDLE) ×2
NEEDLE FILTER BLUNT 18X1 1/2 (NEEDLE) ×1 IMPLANT
NS IRRIG 500ML POUR BTL (IV SOLUTION) ×3 IMPLANT
PACK HIP COMPR (MISCELLANEOUS) ×3 IMPLANT
SCREW BONE CORTICAL 5.0X40 (Screw) ×3 IMPLANT
SCREW LAG 10.5MMX105MM HFN (Screw) ×3 IMPLANT
STAPLER SKIN PROX 35W (STAPLE) ×3 IMPLANT
SUT VIC AB 1 CT1 36 (SUTURE) ×3 IMPLANT
SUT VIC AB 2-0 CT1 (SUTURE) ×3 IMPLANT
SYR 10ML LL (SYRINGE) ×3 IMPLANT

## 2017-07-15 NOTE — Consult Note (Signed)
Patient is a 72 year old who fell at home without prodromal symptoms or loss of consciousness.  She normally uses a walker or cane and was using her walker. On exam her right leg is shortened and externally rotated neurovascular intact distally with skin intact Radiographs show a high intertrochanteric hip fracture Impression is right intertrochanteric hip fracture Plan: ORIF with short IM rod

## 2017-07-15 NOTE — ED Notes (Signed)
ED Provider at bedside. 

## 2017-07-15 NOTE — Anesthesia Procedure Notes (Signed)
Procedure Name: Intubation Date/Time: 07/15/2017 2:33 PM Performed by: Rudean Hitt, CRNA Pre-anesthesia Checklist: Patient identified, Patient being monitored, Timeout performed, Emergency Drugs available and Suction available Patient Re-evaluated:Patient Re-evaluated prior to induction Oxygen Delivery Method: Circle system utilized Preoxygenation: Pre-oxygenation with 100% oxygen Induction Type: IV induction Ventilation: Mask ventilation without difficulty Laryngoscope Size: Glidescope (Small mouth opening,Only view of the epiglottis with MAC 3 blade, Grade 2 view with McGrath., Intubated without difficulty with glidescope with d blade.) Grade View: Grade II Tube type: Oral Tube size: 7.0 mm Number of attempts: 3 Airway Equipment and Method: Stylet and Video-laryngoscopy Placement Confirmation: ETT inserted through vocal cords under direct vision,  positive ETCO2 and breath sounds checked- equal and bilateral Secured at: 21 cm Tube secured with: Tape Dental Injury: Teeth and Oropharynx as per pre-operative assessment  Difficulty Due To: Difficulty was anticipated and Difficult Airway- due to large tongue Comments: Small mouth opening,Only view of the epiglottis with MAC 3 blade, Grade 2 view with McGrath., Intubated without difficulty with glidescope with d blade.

## 2017-07-15 NOTE — Clinical Social Work Note (Signed)
Clinical Social Work Assessment  Patient Details  Name: Bethany Joseph MRN: [id number] Date of Birth: Mar 17, 1945  Date of referral:  07/15/17               Reason for consult:  Facility Placement                Permission sought to share information with:  Chartered certified accountant granted to share information::  Yes, Verbal Permission Granted  Name::      [place]::   [place]   Relationship::     Contact Information:     Housing/Transportation Living arrangements for the past 2 months:  [place] of Information:  Patient, Adult Children, Spouse Patient Interpreter Needed:  None Criminal Activity/Legal Involvement Pertinent to Current Situation/Hospitalization:  No - Comment as needed Significant Relationships:  Adult Children, Spouse Lives with:  Spouse Do you feel safe going back to the place where you live?  Yes Need for family participation in patient care:  Yes (Comment)  Care giving concerns:  Patient lives in [place] with her husband Bethany Joseph.    Social Worker assessment / plan:  Holiday representative ([employer]) reviewed chart and noted that patient has a hip fracture. Surgery and PT are pending. CSW met with patient today prior to surgery and her husband Bethany Joseph and daughter Bethany Joseph [phone] were at bedside. Patient was alert and oriented X4 and was laying in the bed. Patient appeared to be in pain, CSW made nurse aware. CSW introduced self and explained role of CSW department. Per husband they live in [place] and have 1 adult daughter Bethany Joseph. Per husband he is patient's HPOA and Bethany Joseph is second on the HPOA. CSW explained that after surgery PT will evaluate patient and make a recommendation of home health or SNF. CSW explained that [facility] will have to approve SNF. Per daughter patient has had home health before and went to [facility] in the past. Patient prefers to go home but is open to SNF if needed. Patient is  agreeable to SNF search in [facility] and preference is [place] or [place]. FL2 complete and faxed out. CSW will continue to follow and assist as needed.    Employment status:  Retired Nurse, adult PT Recommendations:  Not assessed at this time Information / Referral to community resources:  [place]  Patient/Family's Response to care:  Patient is open to SNF if needed.   Patient/Family's Understanding of and Emotional Response to Diagnosis, Current Treatment, and Prognosis:  Patient and her family were very pleasant and thanked CSW for assistance.   Emotional Assessment Appearance:  Appears stated age Attitude/Demeanor/Rapport:    Affect (typically observed):  Accepting, Adaptable, Pleasant Orientation:  Oriented to Self, Oriented to Place, Oriented to  Time, Oriented to Situation Alcohol / Substance use:  Not Applicable Psych involvement (Current and /or in the community):  No (Comment)  Discharge Needs  Concerns to be addressed:  Discharge Planning Concerns Readmission within the last 30 days:  No Current discharge risk:  Dependent with Mobility Barriers to Discharge:  Continued Medical Work up   [employer], Veronia Beets, LCSW 07/15/2017, 11:15 AM

## 2017-07-15 NOTE — [place] FL2 (Signed)
[place] MEDICAID FL2 LEVEL OF CARE SCREENING TOOL     IDENTIFICATION  Patient Name: Bethany Joseph Birthdate: 08/18/1945 Sex: female Admission Date (Current Location): 07/15/2017  [place]   Facility and Address:  [facility], [address], [place], [place] [address]      Provider Number: [id number]  Attending Physician Name and Address:  Katha HammingKonidena, Snehalatha, MD  Relative Name and Phone Number:       Current Level of Care: Hospital Recommended Level of Care: Skilled Nursing Facility Prior Approval Number:    Date Approved/Denied:   PASRR Number: ([id number] [id number])  Discharge Plan: SNF    Current Diagnoses: Patient Active Problem List   Diagnosis Date Noted  . Hip fracture (HCC) 07/15/2017  . Fracture of multiple pubic rami (HCC) 07/19/2016  . Leg pain 06/25/2016  . Varicose veins of lower extremities with ulcer (HCC) 03/26/2016  . Chronic venous insufficiency 03/26/2016  . Essential hypertension 03/26/2016  . Type 2 diabetes mellitus (HCC) 03/26/2016  . COPD (chronic obstructive pulmonary disease) (HCC) 03/26/2016    Orientation RESPIRATION BLADDER Height & Weight     Self, Time, Situation, Place  Normal Continent Weight: 164 lb (74.4 kg) Height:  5\' 5"  (165.1 cm)  BEHAVIORAL SYMPTOMS/MOOD NEUROLOGICAL BOWEL NUTRITION STATUS      Continent Diet(Diet: NPO for surgery to be advanced. )  AMBULATORY STATUS COMMUNICATION OF NEEDS Skin   Extensive Assist Verbally Surgical wounds                       Personal Care Assistance Level of Assistance  Bathing, Feeding, Dressing Bathing Assistance: Limited assistance Feeding assistance: Independent Dressing Assistance: Limited assistance     Functional Limitations Info  Sight, Hearing, Speech Sight Info: Adequate Hearing Info: Adequate Speech Info: Adequate    SPECIAL CARE FACTORS FREQUENCY  PT (By licensed PT), OT (By licensed OT)     PT Frequency:  (5) OT Frequency: (5)            Contractures      Additional Factors Info  Code Status, Allergies Code Status Info: (Full Code. ) Allergies Info: (Neosporin Neomycin-polymyxin-gramicidin, Codeine, Latex, Penicillins)           Current Medications (07/15/2017):  This is the current hospital active medication list Current Facility-Administered Medications  Medication Dose Route Frequency Provider Last Rate Last Dose  . 0.9 %  sodium chloride infusion   Intravenous Continuous Arnaldo Nataliamond, Michael S, MD 100 mL/hr at 07/15/17 0420    . acetaminophen (TYLENOL) tablet 650 mg  650 mg Oral Q6H PRN Arnaldo Nataliamond, Michael S, MD       Or  . acetaminophen (TYLENOL) suppository 650 mg  650 mg Rectal Q6H PRN Arnaldo Nataliamond, Michael S, MD      . bisacodyl (DULCOLAX) suppository 10 mg  10 mg Rectal PRN Arnaldo Nataliamond, Michael S, MD      . clindamycin (CLEOCIN) IVPB 600 mg  600 mg Intravenous Once Kennedy BuckerMenz, Michael, MD      . cyclobenzaprine (FLEXERIL) tablet 5 mg  5 mg Oral TID PRN Arnaldo Nataliamond, Michael S, MD   5 mg at 07/15/17 [phone]  . digoxin (LANOXIN) tablet 0.125 mg  0.125 mg Oral Daily Arnaldo Nataliamond, Michael S, MD      . docusate sodium (COLACE) capsule 100 mg  100 mg Oral BID Arnaldo Nataliamond, Michael S, MD      . furosemide (LASIX) tablet 20 mg  20 mg Oral Daily Arnaldo Nataliamond, Michael S, MD      .  gabapentin (NEURONTIN) capsule 100 mg  100 mg Oral QHS Arnaldo Natal, MD      . HYDROcodone-acetaminophen (NORCO/VICODIN) 5-325 MG per tablet 1 tablet  1 tablet Oral Q6H PRN Arnaldo Natal, MD   1 tablet at 07/15/17 0531  . levothyroxine (SYNTHROID, LEVOTHROID) tablet 25 mcg  25 mcg Oral QAC breakfast Arnaldo Natal, MD      . liothyronine (CYTOMEL) tablet 100 mcg  100 mcg Oral Daily Arnaldo Natal, MD      . loratadine (CLARITIN) tablet 10 mg  10 mg Oral Daily Arnaldo Natal, MD      . morphine 4 MG/ML injection 4 mg  4 mg Intravenous Q3H PRN Katha Hamming, MD   4 mg at 07/15/17 1041  . nebivolol (BYSTOLIC) tablet 2.5 mg   2.5 mg Oral Daily PRN Arnaldo Natal, MD      . omega-3 acid ethyl esters (LOVAZA) capsule 1 g  1 g Oral Daily Arnaldo Natal, MD      . ondansetron [facility]) tablet 4 mg  4 mg Oral Q6H PRN Arnaldo Natal, MD       Or  . ondansetron [facility]) injection 4 mg  4 mg Intravenous Q6H PRN Arnaldo Natal, MD   4 mg at 07/15/17 0417  . oxyCODONE (Oxy IR/ROXICODONE) immediate release tablet 5 mg  5 mg Oral Q3H PRN Arnaldo Natal, MD   5 mg at 07/15/17 0417  . pantoprazole (PROTONIX) EC tablet 40 mg  40 mg Oral Daily Arnaldo Natal, MD      . pravastatin (PRAVACHOL) tablet 20 mg  20 mg Oral q1800 Arnaldo Natal, MD      . raloxifene (EVISTA) tablet 60 mg  60 mg Oral Daily Arnaldo Natal, MD      . vitamin E capsule 400 Units  400 Units Oral TID Arnaldo Natal, MD      . zolpidem [facility]) tablet 5 mg  5 mg Oral QHS PRN Arnaldo Natal, MD         Discharge Medications: Please see discharge summary for a list of discharge medications.  Relevant Imaging Results:  Relevant Lab Results:   Additional Information (SSN: [id number])  Cellie Dardis, Darleen Crocker, LCSW

## 2017-07-15 NOTE — Anesthesia Preprocedure Evaluation (Addendum)
Anesthesia Evaluation  Patient identified by MRN, date of birth, ID band Patient awake    Reviewed: Allergy & Precautions, H&P , NPO status , reviewed documented beta blocker date and time   Airway Mallampati: II  TM Distance: <3 FB Neck ROM: full    Dental  (+) Poor Dentition, Missing, Chipped, Dental Advidsory Given   Pulmonary asthma , COPD, former smoker,    Pulmonary exam normal        Cardiovascular hypertension, + Peripheral Vascular Disease  + dysrhythmias Atrial Fibrillation  Rhythm:irregular     Neuro/Psych    GI/Hepatic GERD  Medicated and Controlled,  Endo/Other  diabetes, Type 2Hyperthyroidism   Renal/GU      Musculoskeletal  (+) Arthritis ,   Abdominal   Peds  Hematology   Anesthesia Other Findings Past Medical History: No date: Allergic rhinitis No date: Aortic valve disorder No date: Asthma No date: Atrial fibrillation (HCC) No date: Avascular necrosis (HCC) No date: Diabetes (HCC) No date: Hyperlipidemia No date: Hyperthyroidism No date: Osteoarthritis No date: Osteoporosis  Past Surgical History: No date: ABDOMINAL HYSTERECTOMY No date: CHOLECYSTECTOMY No date: ORIF FIBULA FRACTURE No date: ORIF HUMERUS FRACTURE No date: REPLACEMENT TOTAL KNEE BILATERAL  BMI    Body Mass Index:  27.29 kg/m      Reproductive/Obstetrics                            Anesthesia Physical Anesthesia Plan  ASA: III  Anesthesia Plan: General   Post-op Pain Management:    Induction: Intravenous  PONV Risk Score and Plan: 3 and Treatment may vary due to age or medical condition, Ondansetron and Metaclopromide  Airway Management Planned:   Additional Equipment:   Intra-op Plan:   Post-operative Plan: Extubation in OR  Informed Consent: I have reviewed the patients History and Physical, chart, labs and discussed the procedure including the risks, benefits and alternatives  for the proposed anesthesia with the patient or authorized representative who has indicated his/her understanding and acceptance.   Dental Advisory Given  Plan Discussed with: CRNA  Anesthesia Plan Comments:        Anesthesia Quick Evaluation

## 2017-07-15 NOTE — Clinical Social Work Placement (Signed)
   CLINICAL SOCIAL WORK PLACEMENT  NOTE  Date:  07/15/2017  Patient Details  Name: Bethany Joseph MRN: [id number] Date of Birth: 09/17/1945  Clinical Social Work is seeking post-discharge placement for this patient at the Skilled  Nursing Facility level of care (*CSW will initial, date and re-position this form in  chart as items are completed):  Yes   Patient/family provided with [facility] Clinical Social Work Department's list of facilities offering this level of care within the geographic area requested by the patient (or if unable, by the patient's family).  Yes   Patient/family informed of their freedom to choose among providers that offer the needed level of care, that participate in Medicare, Medicaid or managed care program needed by the patient, have an available bed and are willing to accept the patient.  Yes   Patient/family informed of [facility]'s ownership interest in [facility] and [facility], as well as of the fact that they are under no obligation to receive care at these facilities.  PASRR submitted to EDS on       PASRR number received on       Existing PASRR number confirmed on 07/15/17     FL2 transmitted to all facilities in geographic area requested by pt/family on 07/15/17     FL2 transmitted to all facilities within larger geographic area on       Patient informed that his/her managed care company has contracts with or will negotiate with certain facilities, including the following:            Patient/family informed of bed offers received.  Patient chooses bed at       Physician recommends and patient chooses bed at      Patient to be transferred to   on  .  Patient to be transferred to facility by       Patient family notified on   of transfer.  Name of family member notified:        PHYSICIAN       Additional Comment:    _______________________________________________ Jahlia Omura, [name], LCSW 07/15/2017, 11:13 AM

## 2017-07-15 NOTE — Transfer of Care (Signed)
Immediate Anesthesia Transfer of Care Note  Patient: Bethany Joseph  Procedure(s) Performed: INTRAMEDULLARY (IM) NAIL INTERTROCHANTRIC (Right )  Patient Location: PACU  Anesthesia Type:General  Level of Consciousness: awake  Airway & Oxygen Therapy: Patient connected to face mask oxygen  Post-op Assessment: Post -op Vital signs reviewed and stable  Post vital signs: stable  Last Vitals:  Vitals Value Taken Time  BP 157/75 07/15/2017  3:29 PM  Temp    Pulse 77 07/15/2017  3:29 PM  Resp 16 07/15/2017  3:29 PM  SpO2 98 % 07/15/2017  3:29 PM  Vitals shown include unvalidated device data.  Last Pain:  Vitals:   07/15/17 1351  TempSrc: Tympanic  PainSc: 9       Patients Stated Pain Goal: 6 (07/15/17 1050)  Complications: No apparent anesthesia complications

## 2017-07-15 NOTE — Anesthesia Post-op Follow-up Note (Signed)
Anesthesia QCDR form completed.        

## 2017-07-15 NOTE — Op Note (Signed)
07/15/2017  3:21 PM  PATIENT:  Bethany SpinnerKarla Joseph  72 y.o. female  PRE-OPERATIVE DIAGNOSIS:  fractured right femur intertrochanteric  POST-OPERATIVE DIAGNOSIS:  right femur fracture same PROCEDURE:  Procedure(s): INTRAMEDULLARY (IM) NAIL INTERTROCHANTRIC (Right)  SURGEON: Leitha SchullerMichael J Rondell Pardon, MD  ASSISTANTS: None  ANESTHESIA:   general  EBL:  Total I/O In: 658.3 [I.V.:658.3] Out: 475 [Urine:475]  BLOOD ADMINISTERED:none  DRAINS: none   LOCAL MEDICATIONS USED:  NONE  SPECIMEN:  No Specimen  DISPOSITION OF SPECIMEN:  N/A  COUNTS:  YES  TOURNIQUET:  * No tourniquets in log *  IMPLANTS: Biomet 13 x 180 mm 130 degree short affixes rod with 105 leg screw 42 mm interlocking screw  DICTATION: .Dragon Dictation patient was brought to the operating room and after adequate anesthesia was obtained, patient was transferred to the fracture table.  Serum was brought in after the right foot was placed in the traction boot and traction applied with the left leg in the well-leg holder.  Anatomic alignment was obtained with direct traction.  The hip was then prepped and draped using a barrier drape method and appropriate patient identification timeout procedures were completed.  A small incision was made proximal to the greater trochanter and a guidewire inserted to the tip of the trochanter checking position on AP and lateral imaging.  The proximal reaming was then carried out followed by placement of the 13 mm diameter rod to the appropriate depth.  A lateral incision was made and a guidewire was inserted into the near center center position into the femoral head.  Drilling was carried out 105 mm leg screw placed.  Traction was released and compression applied followed by tightening of the screw proximally to prevent rotation of the leg screw.  A quarter turn was made to allow this to compress.  Next the distal interlocking screw was placed through the drill sleeve with a 42 mm screw placed this gave  appropriate length and sized to the screw as well permanent AP lateral imaging of the hip and anterior of the distal screw were obtained.  The wounds were irrigated and closed with #1 Vicryl closing the deep fascia approximately 2-0 Vicryl subcutaneously and skin staples.  Xeroform and honeycomb dressings applied  PLAN OF CARE: Continue as inpatient  PATIENT DISPOSITION:  PACU - hemodynamically stable.

## 2017-07-15 NOTE — Progress Notes (Signed)
72 year old female who fell at home due to balance problem, found to have right intertrochanteric hip fracture.  Labs, medications reviewed, patient scheduled for ORIF with IM rod placement by Dr. Rosita KeaMenz this afternoon.  Continue pain control, IV hydration. Discussed with family.

## 2017-07-15 NOTE — ED Triage Notes (Signed)
Patient coming [facility] for mechanical fall. Patient c/o right hip pain. Right leg is rotated and shortened. Patient given 100 mcg fentanyl in transit by EMS

## 2017-07-15 NOTE — ED Notes (Signed)
Admitting MD at bedside.

## 2017-07-15 NOTE — ED Provider Notes (Signed)
[facility] Emergency Department Provider Note   ____________________________________________   First MD Initiated Contact with Patient 07/15/17 [id number]     (approximate)  I have reviewed the triage vital signs and the nursing notes.   HISTORY  Chief Complaint Fall and Hip Pain    HPI Bethany Joseph is a 72 y.o. female who comes into the hospital today after a fall.  The patient states that she loses her balance from time to time and it occurred today.  She reports that she fell forward but she cannot say exactly how she fell.  She denies hitting her head or having any loss of consciousness.  The patient spouse states that he found her face down but she states that she fell onto her right hip.  She is having some right hip pain and rates her pain a 9 out of 10 in intensity currently.  She is here for evaluation of her symptoms.   Past Medical History:  Diagnosis Date  . Allergic rhinitis   . Aortic valve disorder   . Asthma   . Atrial fibrillation (HCC)   . Avascular necrosis (HCC)   . Diabetes (HCC)   . Hyperlipidemia   . Hyperthyroidism   . Osteoarthritis   . Osteoporosis     Patient Active Problem List   Diagnosis Date Noted  . Hip fracture (HCC) 07/15/2017  . Fracture of multiple pubic rami (HCC) 07/19/2016  . Leg pain 06/25/2016  . Varicose veins of lower extremities with ulcer (HCC) 03/26/2016  . Chronic venous insufficiency 03/26/2016  . Essential hypertension 03/26/2016  . Type 2 diabetes mellitus (HCC) 03/26/2016  . COPD (chronic obstructive pulmonary disease) (HCC) 03/26/2016    Past Surgical History:  Procedure Laterality Date  . ABDOMINAL HYSTERECTOMY    . CHOLECYSTECTOMY    . ORIF FIBULA FRACTURE    . ORIF HUMERUS FRACTURE    . REPLACEMENT TOTAL KNEE BILATERAL      Prior to Admission medications   Medication Sig Start Date End Date Taking? Authorizing Provider  albuterol (PROAIR HFA) 108 (90 Base) MCG/ACT inhaler USE 2  PUFFS TWICE DAILY AS NEEDED shortness of breath 04/02/14  Yes [provider]  aspirin 325 MG tablet Take 325 mg by mouth daily.   Yes [provider]  bisacodyl (DULCOLAX) 10 MG suppository Place 10 mg rectally as needed for moderate constipation.   Yes [provider]  digoxin (LANOXIN) 0.125 MG tablet Take 0.125 mg by mouth daily.   Yes [provider]  ELIQUIS 5 MG TABS tablet Take 5 mg by mouth every 12 (twelve) hours. 06/24/17  Yes [provider]  furosemide (LASIX) 20 MG tablet Take 20 mg by mouth daily.    Yes [provider]  gabapentin (NEURONTIN) 100 MG capsule Take 100 mg by mouth at bedtime.   Yes [provider]  HYDROcodone-acetaminophen (NORCO/VICODIN) 5-325 MG tablet Take 1 tablet by mouth every 6 (six) hours as needed for moderate pain. 07/20/16  Yes Auburn Bilberry, MD  levothyroxine (SYNTHROID, LEVOTHROID) 25 MCG tablet Take 25 mcg by mouth daily before breakfast.   Yes [provider]  liothyronine (CYTOMEL) 25 MCG tablet Take 100 mcg by mouth daily.    Yes [provider]  loratadine (CLARITIN) 10 MG tablet Take 10 mg by mouth daily.   Yes [provider]  metFORMIN (GLUCOPHAGE-XR) 500 MG 24 hr tablet Take 1,000 mg by mouth every evening.   Yes [provider]  nebivolol (BYSTOLIC)  2.5 MG tablet Take 2.5 mg by mouth daily as needed (hypertension).   Yes [provider]  Omega 3 1200 MG CAPS Take 1,200 mg by mouth daily.   Yes [provider]  omeprazole (PRILOSEC) 40 MG capsule TAKE ONE CAPSULE BY MOUTH DAILY 06/13/15  Yes [provider]  pravastatin (PRAVACHOL) 20 MG tablet Take 20 mg by mouth. 10/24/15  Yes [provider]  raloxifene (EVISTA) 60 MG tablet TAKE ONE (1) TABLET BY MOUTH EVERY DAY 02/22/16  Yes [provider]  vitamin E 400 UNIT capsule Take 400 Units by mouth 3 (three) times daily.    Yes [provider]  zolpidem  (AMBIEN) 5 MG tablet Take 5 mg by mouth at bedtime as needed for sleep.   Yes [provider]  silver sulfADIAZINE (SILVADENE) 1 % cream Apply 1 application topically 2 (two) times daily. Patient not taking: Reported on 07/19/2016 01/25/16   Payton Mccallumonty, [place], MD    Allergies Neosporin [neomycin-polymyxin-gramicidin]; Codeine; Latex; and Penicillins  No family history on file.  Social History Social History   Tobacco Use  . Smoking status: Former Games developermoker  . Smokeless tobacco: Never Used  Substance Use Topics  . Alcohol use: Yes    Alcohol/week: 8.4 oz    Types: 14 Glasses of wine per week  . Drug use: No    Review of Systems  Constitutional: No fever/chills Eyes: No visual changes. ENT: No sore throat. Cardiovascular: Denies chest pain. Respiratory: Denies shortness of breath. Gastrointestinal: No abdominal pain.  No nausea, no vomiting.  No diarrhea.  No constipation. Genitourinary: Negative for dysuria. Musculoskeletal: Right hip pain Skin: Negative for rash. Neurological: Negative for headaches, focal weakness or numbness.   ____________________________________________   PHYSICAL EXAM:  VITAL SIGNS: ED Triage Vitals  Enc Vitals Group     BP 07/15/17 0024 (!) 153/65     Pulse Rate 07/15/17 0024 62     Resp 07/15/17 0024 15     Temp 07/15/17 0024 98.4 F (36.9 C)     Temp Source 07/15/17 0024 Oral     SpO2 07/15/17 0024 100 %     Weight 07/15/17 0022 165 lb (74.8 kg)     Height 07/15/17 0557 5\' 5"  (1.651 m)     Head Circumference --      Peak Flow --      Pain Score 07/15/17 0021 9     Pain Loc --      Pain Edu? --      Excl. in GC? --     Constitutional: Alert and oriented. Well appearing and in moderate distress. Eyes: Conjunctivae are normal. PERRL. EOMI. Head: Atraumatic. Nose: No congestion/rhinnorhea. Mouth/Throat: Mucous membranes are moist.  Oropharynx non-erythematous. Cardiovascular: Normal rate, regular rhythm. Grossly normal heart  sounds.  Good peripheral circulation. Respiratory: Normal respiratory effort.  No retractions. Lungs CTAB. Gastrointestinal: Soft and nontender. No distention. No abdominal bruits. No CVA tenderness. Musculoskeletal: Right hip pain to palpation Neurologic:  Normal speech and language.  Skin:  Skin is warm, dry and intact.  Psychiatric: Mood and affect are normal.   ____________________________________________   LABS (all labs ordered are listed, but only abnormal results are displayed)  Labs Reviewed  CBC - Abnormal; Notable for the following components:      Result Value   RBC 3.67 (*)    Hemoglobin 11.5 (*)    HCT 34.2 (*)    RDW 15.7 (*)    All other components within normal limits  BASIC METABOLIC PANEL - Abnormal; Notable for the following components:   Glucose, Bld 132 (*)    BUN 21 (*)    Calcium 8.8 (*)    All other components within normal limits  TSH - Abnormal; Notable for the following components:   TSH <0.010 (*)    All other components within normal limits  SURGICAL PCR SCREEN  HEMOGLOBIN A1C   ____________________________________________  EKG  ED ECG REPORT I, Rebecka Apley, the attending physician, personally viewed and interpreted this ECG.   Date: 07/15/2017  EKG Time: 0028  Rate: 54  Rhythm: atrial fibrillation, rate 58  Axis: Normal  Intervals:none  ST&T Change: None  ____________________________________________  RADIOLOGY  ED MD interpretation: Chest x-ray: No acute cardiopulmonary process seen, mild prominence of the right hilum nonspecific  Right hip x-ray: Displaced comminuted right femoral intertrochanteric fracture with medial angulation of the distal femur.  Official radiology report(s): Dg Chest 1 View  Result Date: 07/15/2017 CLINICAL DATA:  Preoperative chest radiograph for right hip fracture. Initial encounter. EXAM: CHEST  1 VIEW COMPARISON:  None. FINDINGS: The lungs are well-aerated. Mild prominence of the right hilum is  nonspecific but could reflect normal vasculature. There is no evidence of focal opacification, pleural effusion or pneumothorax. The cardiomediastinal silhouette is within normal limits. No acute osseous abnormalities are seen. Mild chronic-appearing right-sided rib deformities are noted. IMPRESSION: No acute cardiopulmonary process seen. Mild prominence of the right hilum is nonspecific but could reflect normal vasculature. PA and lateral chest radiographs could be considered on an elective nonemergent basis for further evaluation. Electronically Signed   By: Roanna Raider M.D.   On: 07/15/2017 00:55   Dg Hip Unilat  With Pelvis 2-3 Views Right  Result Date: 07/15/2017 CLINICAL DATA:  Status post fall, with acute onset of right hip pain. Initial encounter. EXAM: DG HIP (WITH OR WITHOUT PELVIS) 2-3V RIGHT COMPARISON:  None. FINDINGS: There is a displaced comminuted right femoral intertrochanteric fracture, with medial angulation of the distal femur. The right femoral head remains seated at the acetabulum. The left-sided dynamic hip screw is grossly unremarkable. The left hip joint is unremarkable. Mild degenerative change is noted at the lower lumbar spine at the sacroiliac joints. The visualized bowel gas pattern is grossly unremarkable. Soft tissue swelling is noted overlying the fracture site. IMPRESSION: Displaced comminuted right femoral intertrochanteric fracture, with medial angulation of the distal femur. Electronically Signed   By: Roanna Raider M.D.   On: 07/15/2017 00:53    ____________________________________________   PROCEDURES  Procedure(s) performed: None  Procedures  Critical Care performed: No  ____________________________________________   INITIAL IMPRESSION / ASSESSMENT AND PLAN / ED COURSE  As part of my medical decision making, I reviewed the following data within the electronic MEDICAL RECORD NUMBER Notes from prior ED visits and [place] Controlled Substance Database   This  is a 72 year old female who comes into the hospital today after a fall.  The patient is having some right hip pain with a concern for a hip fracture.  The patient had some x-rays done and it appears that she does have a right hip fracture.  We did check some blood work as well which was unremarkable.  We did give the patient a dose of morphine 4 mg IV x1 and Zofran IV x1.  I contacted Dr. Rosita Kea the orthopedic surgeon who will see the patient in the morning.  The patient will be admitted to the hospitalist service for her hip fracture.  ____________________________________________   FINAL CLINICAL IMPRESSION(S) / ED DIAGNOSES  Final diagnoses:  Closed fracture of right hip, initial encounter [facility])  Fall, initial encounter     ED Discharge Orders    None       Note:  This document was prepared using Dragon voice recognition software and may include unintentional dictation errors.    Rebecka Apley, MD 07/15/17 [phone]

## 2017-07-15 NOTE — Progress Notes (Signed)
Clinical Social Worker (CSW) met with patient and her husband Herbie Baltimore was at bedside. CSW presented bed offers, they chose [facility] Per [facility] admissions coordinator at [facility] she will start [facility] authorization when appropriate and has a private room for patient.   [employer], LCSW [phone]

## 2017-07-15 NOTE — Anesthesia Postprocedure Evaluation (Signed)
Anesthesia Post Note  Patient: Bethany Joseph  Procedure(s) Performed: INTRAMEDULLARY (IM) NAIL INTERTROCHANTRIC (Right )  Patient location during evaluation: PACU Anesthesia Type: General Level of consciousness: awake and alert Pain management: pain level controlled Vital Signs Assessment: post-procedure vital signs reviewed and stable Respiratory status: spontaneous breathing and respiratory function stable Cardiovascular status: stable Anesthetic complications: no     Last Vitals:  Vitals:   07/15/17 1351 07/15/17 1529  BP: (!) 165/86 (!) 157/75  Pulse: 80 67  Resp: 16 12  Temp: 37.1 C   SpO2: 95% 98%    Last Pain:  Vitals:   07/15/17 1351  TempSrc: Tympanic  PainSc: 9                  KEPHART,WILLIAM K

## 2017-07-15 NOTE — Progress Notes (Signed)
Patient rates pain 10/10. Morphine not available til 1150. MD Luberta MutterKonidena in the room and received verbal orders for morphine Q3 hrs. See MAR.

## 2017-07-15 NOTE — H&P (Signed)
Bethany Joseph is an 72 y.o. female.   Chief Complaint: Fall HPI: The patient with past medical history of atrial fibrillation, hyperthyroidism, osteoporosis and diabetes presents to the emergency department after a fall.  The patient has had imbalance and states that she was not paying attention and fell to the side.  The patient denies loss of consciousness.  She immediately felt pain in her right leg and was unable to stand.  X-ray in the emergency department revealed fracture of the distal femur which prompted the emergency department staff to call the hospitalist staff for admission.  Past Medical History:  Diagnosis Date  . Allergic rhinitis   . Aortic valve disorder   . Asthma   . Atrial fibrillation ([place])   . Avascular necrosis ([place])   . Diabetes ([place])   . Hyperlipidemia   . Hyperthyroidism   . Osteoarthritis   . Osteoporosis     Past Surgical History:  Procedure Laterality Date  . ABDOMINAL HYSTERECTOMY    . CHOLECYSTECTOMY    . ORIF FIBULA FRACTURE    . ORIF HUMERUS FRACTURE    . REPLACEMENT TOTAL KNEE BILATERAL      No family history on file. No re-occuring chronic medical illnesses throughout family  Social History:  reports that she has quit smoking. She has never used smokeless tobacco. She reports that she drinks about 8.4 oz of alcohol per week. She reports that she does not use drugs.  Allergies:  Allergies  Allergen Reactions  . Neosporin [Neomycin-Polymyxin-Gramicidin] Hives  . Codeine Rash  . Latex Rash  . Penicillins Rash and Other (See Comments)    Has patient had a PCN reaction causing immediate rash, facial/tongue/throat swelling, SOB or lightheadedness with hypotension: Unknown Has patient had a PCN reaction causing severe rash involving mucus membranes or skin necrosis: Unknown Has patient had a PCN reaction that required hospitalization: Unknown Has patient had a PCN reaction occurring within the last 10 years: No If all of the above answers are  "NO", then may proceed with Cephalosporin use.     Medications Prior to Admission  Medication Sig Dispense Refill  . albuterol (PROAIR HFA) 108 (90 Base) MCG/ACT inhaler USE 2 PUFFS TWICE DAILY AS NEEDED shortness of breath    . aspirin 325 MG tablet Take 325 mg by mouth daily.    . bisacodyl (DULCOLAX) 10 MG suppository Place 10 mg rectally as needed for moderate constipation.    . digoxin (LANOXIN) 0.125 MG tablet Take 0.125 mg by mouth daily.    Marland Kitchen ELIQUIS 5 MG TABS tablet Take 5 mg by mouth every 12 (twelve) hours.  11  . furosemide (LASIX) 20 MG tablet Take 20 mg by mouth daily.     Marland Kitchen gabapentin (NEURONTIN) 100 MG capsule Take 100 mg by mouth at bedtime.    Marland Kitchen HYDROcodone-acetaminophen (NORCO/VICODIN) 5-325 MG tablet Take 1 tablet by mouth every 6 (six) hours as needed for moderate pain. 30 tablet 0  . levothyroxine (SYNTHROID, LEVOTHROID) 25 MCG tablet Take 25 mcg by mouth daily before breakfast.    . liothyronine (CYTOMEL) 25 MCG tablet Take 100 mcg by mouth daily.     Marland Kitchen loratadine (CLARITIN) 10 MG tablet Take 10 mg by mouth daily.    . metFORMIN (GLUCOPHAGE-XR) 500 MG 24 hr tablet Take 1,000 mg by mouth every evening.    . nebivolol (BYSTOLIC) 2.5 MG tablet Take 2.5 mg by mouth daily as needed (hypertension).    . Omega 3 1200 MG CAPS Take 1,200  mg by mouth daily.    Marland Kitchen omeprazole (PRILOSEC) 40 MG capsule TAKE ONE CAPSULE BY MOUTH DAILY    . pravastatin (PRAVACHOL) 20 MG tablet Take 20 mg by mouth.    . raloxifene (EVISTA) 60 MG tablet TAKE ONE (1) TABLET BY MOUTH EVERY DAY    . vitamin E 400 UNIT capsule Take 400 Units by mouth 3 (three) times daily.     Marland Kitchen zolpidem (AMBIEN) 5 MG tablet Take 5 mg by mouth at bedtime as needed for sleep.    . silver sulfADIAZINE (SILVADENE) 1 % cream Apply 1 application topically 2 (two) times daily. (Patient not taking: Reported on 07/19/2016) 50 g 0    Results for orders placed or performed during the hospital encounter of 07/15/17 (from the past 48  hour(s))  CBC     Status: Abnormal   Collection Time: 07/15/17  1:11 AM  Result Value Ref Range   WBC 4.5 3.6 - 11.0 K/uL   RBC 3.67 (L) 3.80 - 5.20 MIL/uL   Hemoglobin 11.5 (L) 12.0 - 16.0 g/dL   HCT 34.2 (L) 35.0 - 47.0 %   MCV 93.1 80.0 - 100.0 fL   MCH 31.3 26.0 - 34.0 pg   MCHC 33.6 32.0 - 36.0 g/dL   RDW 15.7 (H) 11.5 - 14.5 %   Platelets 176 150 - 440 K/uL    Comment: Performed at [facility], [address] [place], [place] [address]  Basic metabolic panel     Status: Abnormal   Collection Time: 07/15/17  1:11 AM  Result Value Ref Range   Sodium 137 135 - 145 mmol/L   Potassium 4.2 3.5 - 5.1 mmol/L   Chloride 103 101 - 111 mmol/L   CO2 22 22 - 32 mmol/L   Glucose, Bld 132 (H) 65 - 99 mg/dL   BUN 21 (H) 6 - 20 mg/dL   Creatinine, Ser 0.67 0.44 - 1.00 mg/dL   Calcium 8.8 (L) 8.9 - 10.3 mg/dL   GFR calc non Af Amer >60 >60 mL/min   GFR calc Af Amer >60 >60 mL/min    Comment: (NOTE) The eGFR has been calculated using the CKD EPI equation. This calculation has not been validated in all clinical situations. eGFR's persistently <60 mL/min signify possible Chronic Kidney Disease.    Anion gap 12 5 - 15    Comment: Performed at [facility], [address] [place], [place] [address]  TSH     Status: Abnormal   Collection Time: 07/15/17  1:11 AM  Result Value Ref Range   TSH <0.010 (L) 0.350 - 4.500 uIU/mL    Comment: Performed by a 3rd Generation assay with a functional sensitivity of <=0.01 uIU/mL. Performed at [facility], [address] [place], [place] [address]    Dg Chest 1 View  Result Date: 07/15/2017 CLINICAL DATA:  Preoperative chest radiograph for right hip fracture. Initial encounter. EXAM: CHEST  1 VIEW COMPARISON:  None. FINDINGS: The lungs are well-aerated. Mild prominence of the right hilum is nonspecific but could reflect normal vasculature. There is no evidence of focal opacification, pleural effusion or pneumothorax. The  cardiomediastinal silhouette is within normal limits. No acute osseous abnormalities are seen. Mild chronic-appearing right-sided rib deformities are noted. IMPRESSION: No acute cardiopulmonary process seen. Mild prominence of the right hilum is nonspecific but could reflect normal vasculature. PA and lateral chest radiographs could be considered on an elective nonemergent basis for further evaluation. Electronically Signed   By:  Garald Balding M.D.   On: 07/15/2017 00:55   Dg Hip Unilat  With Pelvis 2-3 Views Right  Result Date: 07/15/2017 CLINICAL DATA:  Status post fall, with acute onset of right hip pain. Initial encounter. EXAM: DG HIP (WITH OR WITHOUT PELVIS) 2-3V RIGHT COMPARISON:  None. FINDINGS: There is a displaced comminuted right femoral intertrochanteric fracture, with medial angulation of the distal femur. The right femoral head remains seated at the acetabulum. The left-sided dynamic hip screw is grossly unremarkable. The left hip joint is unremarkable. Mild degenerative change is noted at the lower lumbar spine at the sacroiliac joints. The visualized bowel gas pattern is grossly unremarkable. Soft tissue swelling is noted overlying the fracture site. IMPRESSION: Displaced comminuted right femoral intertrochanteric fracture, with medial angulation of the distal femur. Electronically Signed   By: Garald Balding M.D.   On: 07/15/2017 00:53    Review of Systems  Constitutional: Negative for chills and fever.  HENT: Negative for sore throat and tinnitus.   Eyes: Negative for blurred vision and redness.  Respiratory: Negative for cough and shortness of breath.   Cardiovascular: Negative for chest pain, palpitations, orthopnea and PND.  Gastrointestinal: Negative for abdominal pain, diarrhea, nausea and vomiting.  Genitourinary: Negative for dysuria, frequency and urgency.  Musculoskeletal: Positive for falls and joint pain. Negative for myalgias.  Skin: Negative for rash.       No lesions   Neurological: Negative for speech change, focal weakness and weakness.  Endo/Heme/Allergies: Does not bruise/bleed easily.       No temperature intolerance  Psychiatric/Behavioral: Negative for depression and suicidal ideas.    Blood pressure (!) 153/73, pulse 67, temperature 98.5 F (36.9 C), temperature source Oral, resp. rate 20, weight 74.4 kg (164 lb), SpO2 98 %. Physical Exam  Vitals reviewed. Constitutional: She is oriented to person, place, and time. She appears well-developed and well-nourished. No distress.  HENT:  Head: Normocephalic and atraumatic.  Mouth/Throat: Oropharynx is clear and moist.  Eyes: Pupils are equal, round, and reactive to light. Conjunctivae and EOM are normal. No scleral icterus.  Neck: Normal range of motion. Neck supple. No JVD present. No tracheal deviation present. No thyromegaly present.  Cardiovascular: Normal rate, regular rhythm and normal heart sounds. Exam reveals no gallop and no friction rub.  No murmur heard. Respiratory: Effort normal and breath sounds normal.  GI: Soft. Bowel sounds are normal. She exhibits no distension. There is no tenderness.  Genitourinary:  Genitourinary Comments: Deferred  Musculoskeletal: She exhibits no edema.  Deformity of right leg  Lymphadenopathy:    She has no cervical adenopathy.  Neurological: She is alert and oriented to person, place, and time. No cranial nerve deficit. She exhibits normal muscle tone.  Skin: Skin is warm and dry. No rash noted.  Psychiatric: She has a normal mood and affect. Her behavior is normal. Judgment and thought content normal.     Assessment/Plan This is a 72 year old female admitted for hip fracture. 1.  Hip fracture: Distal right femur.  Manage severe pain with IV morphine.  Orthopedic surgery consulted. 2.  Atrial fibrillation: Rate controlled; on Eliquis and aspirin.  Both have been held at this time in anticipation of surgery.  Continue to monitor telemetry.  Patient is  aware of risk of holding anticoagulation.  Continue digoxin 3.  Hyperthyroidism: Apparently status post ablation.  Continue T3 and T4 replacement per home regimen.  Nebivolol for symptomatic tachycardia. 4.  Diabetes mellitus type 2: Diet controlled.  Continue to monitor  blood sugar 5.  Hyperlipidemia: Continue statin therapy 6.  Osteoporosis: Continue Evista 7.  DVT prophylaxis: SCDs 8.  GI prophylaxis: Pantoprazole per home regimen The patient is a full code.  Time spent on admission orders and patient care approximately 45 minutes  Harrie Foreman, MD 07/15/2017, 5:43 AM

## 2017-07-16 ENCOUNTER — Encounter: Payer: Self-pay | Admitting: Orthopedic Surgery

## 2017-07-16 ENCOUNTER — Encounter
Admission: RE | Admit: 2017-07-16 | Discharge: 2017-07-16 | Disposition: A | Discharge status: Home / Self Care | Payer: Medicare Other | Source: Ambulatory Visit | Attending: Internal Medicine | Admitting: Internal Medicine

## 2017-07-16 LAB — BASIC METABOLIC PANEL
ANION GAP: 7 (ref 5–15)
BUN: 13 mg/dL (ref 6–20)
CHLORIDE: 107 mmol/L (ref 101–111)
CO2: 24 mmol/L (ref 22–32)
CREATININE: 0.6 mg/dL (ref 0.44–1.00)
Calcium: 8.2 mg/dL — ABNORMAL LOW (ref 8.9–10.3)
GFR calc non Af Amer: >60 mL/min (ref >60)
Glucose, Bld: 141 mg/dL — ABNORMAL HIGH (ref 65–99)
POTASSIUM: 4.1 mmol/L (ref 3.5–5.1)
SODIUM: 138 mmol/L (ref 135–145)

## 2017-07-16 LAB — CBC
HEMATOCRIT: 30.9 % — AB (ref 35.0–47.0)
HEMOGLOBIN: 10.6 g/dL — AB (ref 12.0–16.0)
MCH: 31.8 pg (ref 26.0–34.0)
MCHC: 34.2 g/dL (ref 32.0–36.0)
MCV: 92.8 fL (ref 80.0–100.0)
Platelets: 147 10*3/uL — ABNORMAL LOW (ref 150–440)
RBC: 3.33 MIL/uL — AB (ref 3.80–5.20)
RDW: 15.7 % — ABNORMAL HIGH (ref 11.5–14.5)
WBC: 5.3 10*3/uL (ref 3.6–11.0)

## 2017-07-16 LAB — GLUCOSE, CAPILLARY
Glucose-Capillary: 125 mg/dL — ABNORMAL HIGH (ref 65–99)
Glucose-Capillary: 132 mg/dL — ABNORMAL HIGH (ref 65–99)
Glucose-Capillary: 133 mg/dL — ABNORMAL HIGH (ref 65–99)

## 2017-07-16 MED ORDER — METFORMIN HCL ER 500 MG PO TB24
1000.0000 mg | ORAL_TABLET | Freq: Every evening | ORAL | Status: DC
  Filled 2017-07-16: qty 2

## 2017-07-16 MED ORDER — METFORMIN HCL ER 500 MG PO TB24
1000.0000 mg | ORAL_TABLET | Freq: Every day | ORAL | Status: DC
  Administered 2017-07-16: 1000 mg via ORAL
  Filled 2017-07-16 (×2): qty 2

## 2017-07-16 NOTE — Progress Notes (Signed)
assesment done. Awake in bed with family visitor. Medicated per mar for pain. Repositioned in bed. Call bell in reach, instructed to call for needs.

## 2017-07-16 NOTE — Progress Notes (Signed)
Physical Therapy Treatment Patient Details Name: Monica Codd MRN: [id number] DOB: February 16, 1945 Today's Date: 07/16/2017    History of Present Illness Pt admitted for R hip fracture and is now s/p ORIF. Pt complaints of fall. History includes Afib, DM, avascular necrosis, and B TKR.    PT Comments    Pt is making slow progress towards goals, however limited by pain on R LE. Needs cues for participation in there-ex and mobility efforts. Appears somewhat motivated to participate, although very fearful of falling and pain with movement. Good endurance with there-ex, however needs assist to perform. +2 needed for mobility, able to stand up full this date, unable to tolerate chair transfer. Will continue to progress as tolerated.   Follow Up Recommendations  SNF     Equipment Recommendations  None recommended by PT    Recommendations for Other Services       Precautions / Restrictions Precautions Precautions: Fall Restrictions Weight Bearing Restrictions: Yes RLE Weight Bearing: Weight bearing as tolerated    Mobility  Bed Mobility Overal bed mobility: Needs Assistance Bed Mobility: Supine to Sit     Supine to sit: +2 for safety/equipment;+2 for physical assistance;Max assist     General bed mobility comments: assist needed for mobility including +2 for trunk stability and R LE. Once seated at EOB, heavy guarding of R side with L lean noted, able to be corrected with cues. INcreased pain noted this session, RN notified of pain meds request. Able to tolerate seated tolerance for increased time this date.  Transfers Overall transfer level: Needs assistance Equipment used: Rolling walker (2 wheeled) Transfers: Sit to/from Stand Sit to Stand: Max assist;+2 physical assistance;+2 safety/equipment;From elevated surface         General transfer comment: first attempt with post leaning noted, unable to raise buttocks off ground. Lack of patient effort noted. On 2nd attempt, 2nd  therapist blocked L knee and pt able to stand with max assist +2 for 5 seconds prior to needing to sit due to anxiety and fear of falling. Secondary to pain, requested to return back to bed.  Ambulation/Gait             General Gait Details: unable to attempt   Stairs             Wheelchair Mobility    Modified Rankin (Stroke Patients Only)       Balance                                            Cognition Arousal/Alertness: Awake/alert Behavior During Therapy: WFL for tasks assessed/performed Overall Cognitive Status: Within Functional Limits for tasks assessed                                        Exercises Other Exercises Other Exercises: supine ther-ex performed on B LE ankle pumps, quad sets, glut sets, hip abd/add, x 12 reps with mod assist Other Exercises: Once seated at EOB, pt with L lateral leaning. Focused on positioning and weight shifting towards midline position. CUes given for upright posture and pushing from bed prior to transfers    General Comments        Pertinent Vitals/Pain Pain Assessment: 0-10 Pain Score: 8  Pain Location: R hip Pain Descriptors / Indicators: Operative site  guarding Pain Intervention(s): Limited activity within patient's tolerance;Patient requesting pain meds-RN notified;Repositioned    Home Living                      Prior Function            PT Goals (current goals can now be found in the care plan section) Acute Rehab PT Goals Patient Stated Goal: to get stronger PT Goal Formulation: With patient Time For Goal Achievement: 07/30/17 Potential to Achieve Goals: Good Progress towards PT goals: Progressing toward goals    Frequency    BID      PT Plan Current plan remains appropriate    Co-evaluation              AM-PAC PT "6 Clicks" Daily Activity  Outcome Measure  Difficulty turning over in bed (including adjusting bedclothes, sheets and  blankets)?: Unable Difficulty moving from lying on back to sitting on the side of the bed? : Unable Difficulty sitting down on and standing up from a chair with arms (e.g., wheelchair, bedside commode, etc,.)?: Unable Help needed moving to and from a bed to chair (including a wheelchair)?: A Lot Help needed walking in hospital room?: Total Help needed climbing 3-5 steps with a railing? : Total 6 Click Score: 7    End of Session Equipment Utilized During Treatment: Gait belt;Oxygen Activity Tolerance: Patient limited by pain Patient left: in bed;with SCD's reapplied Nurse Communication: Mobility status PT Visit Diagnosis: Repeated falls (R29.6);Muscle weakness (generalized) (M62.81);Difficulty in walking, not elsewhere classified (R26.2);Pain Pain - Right/Left: Right Pain - part of body: Hip     Time: 1419-1450 PT Time Calculation (min) (ACUTE ONLY): 31 min  Charges:  $Therapeutic Exercise: 8-22 mins $Therapeutic Activity: 8-22 mins                    G Codes:       Elizabeth PalauStephanie Elisabet Gutzmer, PT, DPT [phone]    Ella Golomb 07/16/2017, 4:04 PM

## 2017-07-16 NOTE — Progress Notes (Signed)
[facility]. Patient and her husband Molly MaduroRobert are aware of above.   Baker Hughes IncorporatedBailey Robben Jagiello, LCSW [phone]

## 2017-07-16 NOTE — Progress Notes (Signed)
OT Cancellation Note  Patient Details Name: Bethany Joseph MRN: [id number] DOB: 01/28/1946   Cancelled Treatment:    Reason Eval/Treat Not Completed: Pain limiting ability to participate. On 2nd attempt to evaluate, pt continues to refuse secondary to pain (8/10, spasms). Pt fatigued, difficulty keeping eyes open and requires cues to attend. Will re-attempt next date. Pt agreeable.  Richrd PrimeJamie Stiller, MPH, MS, OTR/L ascom [phone] 07/16/17, 1:24 PM

## 2017-07-16 NOTE — Progress Notes (Signed)
   Subjective: 1 Day Post-Op Procedure(s) (LRB): INTRAMEDULLARY (IM) NAIL INTERTROCHANTRIC (Right) Patient reports pain as mild.   Patient is well, and has had no acute complaints or problems Denies any CP, SOB, ABD pain. We will start therapy today.    Objective: Vital signs in last 24 hours: Temp:  [98.3 F (36.8 C)-99.4 F (37.4 C)] 98.8 F (37.1 C) (06/04 0735) Pulse Rate:  [62-86] 74 (06/04 0735) Resp:  [9-18] 16 (06/04 0433) BP: (121-183)/(49-87) 123/53 (06/04 0735) SpO2:  [95 %-99 %] 96 % (06/04 0735) Weight:  [74.4 kg (164 lb)] 74.4 kg (164 lb) (06/04 0433)  Intake/Output from previous day: 06/03 0701 - 06/04 0700 In: 3382.1 [I.V.:3382.1] Out: 1125 [Urine:1125] Intake/Output this shift: Total I/O In: -  Out: 50 [Urine:50]  Recent Labs    07/15/17 0111 07/16/17 0418  HGB 11.5* 10.6*   Recent Labs    07/15/17 0111 07/16/17 0418  WBC 4.5 5.3  RBC 3.67* 3.33*  HCT 34.2* 30.9*  PLT 176 147*   Recent Labs    07/15/17 0111 07/16/17 0418  NA 137 138  K 4.2 4.1  CL 103 107  CO2 22 24  BUN 21* 13  CREATININE 0.67 0.60  GLUCOSE 132* 141*  CALCIUM 8.8* 8.2*   No results for input(s): LABPT, INR in the last 72 hours.  EXAM General - Patient is Alert, Appropriate and Oriented Extremity - Neurologically intact Neurovascular intact Sensation intact distally Intact pulses distally Dorsiflexion/Plantar flexion intact No cellulitis present Compartment soft Dressing - dressing C/D/I and scant drainage Motor Function - intact, moving foot and toes well on exam.   Past Medical History:  Diagnosis Date  . Allergic rhinitis   . Aortic valve disorder   . Asthma   . Atrial fibrillation ([facility])   . Avascular necrosis ([facility])   . Diabetes ([facility])   . Hyperlipidemia   . Hyperthyroidism   . Osteoarthritis   . Osteoporosis     Assessment/Plan:   1 Day Post-Op Procedure(s) (LRB): INTRAMEDULLARY (IM) NAIL INTERTROCHANTRIC (Right) Active Problems:   Hip  fracture ([facility])  Estimated body mass index is 27.29 kg/m as calculated from the following:   Height as of this encounter: 5\' 5"  (1.651 m).   Weight as of this encounter: 74.4 kg (164 lb). Advance diet Up with therapy  Needs BM Labs and vital signs stable Pain controlled  Follow up with [facility] in 6 weeks for xrays of right femur Remove staples and apply steri strips on 07/29/2017  DVT Prophylaxis - Foot Pumps, TED hose and eliquis Weight-Bearing as tolerated to right leg   T. Cranston Neighborhris Sirus Labrie, PA-C [facility] 07/16/2017, 9:55 AM

## 2017-07-16 NOTE — Evaluation (Addendum)
Physical Therapy Evaluation Patient Details Name: Bethany Joseph MRN: [id number] DOB: 06/30/1945 Today's Date: 07/16/2017   History of Present Illness  Pt admitted for R hip fracture and is now s/p ORIF. Pt complaints of fall. History includes Afib, DM, avascular necrosis, and B TKR.  Clinical Impression  Pt is a pleasant 72 year old female who was admitted for R hip fracture and is now s/p R hip ORIF. Pt performs bed mobility with mod/max assist +2 and RW and transfers with max assist +2 and RW. Unable to stand at this time. Pt demonstrates deficits with strength/mobility/pain. Pt limited by pain at this time, however motivated to participate. Would benefit from skilled PT to address above deficits and promote optimal return to PLOF; recommend transition to STR upon discharge from acute hospitalization.       Follow Up Recommendations SNF    Equipment Recommendations  None recommended by PT    Recommendations for Other Services       Precautions / Restrictions Precautions Precautions: Fall Restrictions Weight Bearing Restrictions: Yes RLE Weight Bearing: Weight bearing as tolerated      Mobility  Bed Mobility Overal bed mobility: Needs Assistance Bed Mobility: Supine to Sit     Supine to sit: +2 for physical assistance;Mod assist;Max assist     General bed mobility comments: assist needed to inititate mobility towards EOB. Takes extended time to complete. Needs heavy cues for sequencing. Once seated at EOB, pt with R side guarding with L lean noted. Needs mod assist to scoot towards edge for feet on ground. Increased pain with mobility  Transfers Overall transfer level: Needs assistance Equipment used: Rolling walker (2 wheeled) Transfers: Sit to/from Stand Sit to Stand: Max assist;+2 physical assistance;+2 safety/equipment;From elevated surface         General transfer comment: attempted transfers performed with RW and max assist +2. Pt unable to clear buttocks off  bed. Unable to stand and very limited by pain.  Ambulation/Gait             General Gait Details: unable to attempt  Stairs            Wheelchair Mobility    Modified Rankin (Stroke Patients Only)       Balance Overall balance assessment: History of Falls;Needs assistance Sitting-balance support: Feet supported Sitting balance-Leahy Scale: Fair     Standing balance support: Bilateral upper extremity supported Standing balance-Leahy Scale: (unable to stand)                               Pertinent Vitals/Pain Pain Assessment: 0-10 Pain Score: 7  Pain Location: R hip Pain Descriptors / Indicators: Operative site guarding Pain Intervention(s): Limited activity within patient's tolerance;Repositioned;Premedicated before session    Home Living Family/patient expects to be discharged to:: Private residence Living Arrangements: Spouse/significant other Available Help at Discharge: Family Type of Home: House Home Access: Stairs to enter Entrance Stairs-Rails: Can reach both(too wide to reach both at the same time) Entrance Stairs-Number of Steps: 5 Home Layout: One level Home Equipment: Walker - 2 wheels;Walker - 4 wheels;Cane - single point      Prior Function Level of Independence: Independent with assistive device(s)         Comments: uses rollater at baseline, however reports multiple falls at baseline     Hand Dominance        Extremity/Trunk Assessment   Upper Extremity Assessment Upper Extremity Assessment: Generalized weakness(B UE  grossly 4/5)    Lower Extremity Assessment Lower Extremity Assessment: Generalized weakness(R LE grossly 2+/5; L LE grossly 4/5)       Communication   Communication: No difficulties  Cognition Arousal/Alertness: Awake/alert Behavior During Therapy: WFL for tasks assessed/performed Overall Cognitive Status: Within Functional Limits for tasks assessed                                         General Comments      Exercises Other Exercises Other Exercises: Supine ther-ex performed on L LE including ankle pumps, quad sets, glut sets, and hip abd/add x 10 reps with min/mod assist. Limited by pain.   Assessment/Plan    PT Assessment Patient needs continued PT services  PT Problem List Decreased strength;Decreased activity tolerance;Decreased balance;Decreased mobility;Pain;Decreased safety awareness       PT Treatment Interventions DME instruction;Gait training;Stair training;Therapeutic exercise;Balance training    PT Goals (Current goals can be found in the Care Plan section)  Acute Rehab PT Goals Patient Stated Goal: to get stronger PT Goal Formulation: With patient Time For Goal Achievement: 07/30/17 Potential to Achieve Goals: Good    Frequency BID   Barriers to discharge        Co-evaluation               AM-PAC PT "6 Clicks" Daily Activity  Outcome Measure Difficulty turning over in bed (including adjusting bedclothes, sheets and blankets)?: Unable Difficulty moving from lying on back to sitting on the side of the bed? : Unable Difficulty sitting down on and standing up from a chair with arms (e.g., wheelchair, bedside commode, etc,.)?: Unable Help needed moving to and from a bed to chair (including a wheelchair)?: A Lot Help needed walking in hospital room?: Total Help needed climbing 3-5 steps with a railing? : Total 6 Click Score: 7    End of Session Equipment Utilized During Treatment: Gait belt;Oxygen Activity Tolerance: Patient limited by pain Patient left: in bed;with SCD's reapplied(no alarm on) Nurse Communication: Mobility status PT Visit Diagnosis: Repeated falls (R29.6);Muscle weakness (generalized) (M62.81);Difficulty in walking, not elsewhere classified (R26.2);Pain Pain - Right/Left: Right Pain - part of body: Hip    Time: 7829-5621 PT Time Calculation (min) (ACUTE ONLY): 29 min   Charges:   PT Evaluation $PT  Eval Moderate Complexity: 1 Mod PT Treatments $Therapeutic Exercise: 8-22 mins   PT G Codes:        Elizabeth Palau, PT, DPT [phone]   Arlis Yale 07/16/2017, 10:30 AM

## 2017-07-16 NOTE — Progress Notes (Addendum)
OT Cancellation Note  Patient Details Name: Bethany SpinnerKarla Mckenna MRN: [id number] DOB: 02/08/1946   Cancelled Treatment:    Reason Eval/Treat Not Completed: Pain limiting ability to participate. Order received, chart reviewed. Pt reporting 9/10 pain despite pain medication received approx 1 hour prior. RN notified. Will re-attempt later this date with hope of better pain control to improve ability to participate.   Richrd PrimeJamie Stiller, MPH, MS, OTR/L ascom [phone] 07/16/17, 9:48 AM

## 2017-07-16 NOTE — Progress Notes (Signed)
[facility] [place] at [facility]   PATIENT NAME: Bethany SpinnerKarla Joseph    MR#:  [id number]  DATE OF BIRTH:  06/05/1945  SUBJECTIVE: [age] year old female patient with right hip fracture due to fall, status post repair by Dr. Rosita KeaMenz yesterday.  Postoperatively patient has severe pain in the right hip, getting IV and p.o. pain narcotics.  [facility].  CHIEF COMPLAINT:   Chief Complaint  Patient presents with  . Fall  . Hip Pain    REVIEW OF SYSTEMS:   ROS CONSTITUTIONAL: No fever, fatigue or weakness.  EYES: No blurred or double vision.  EARS, NOSE, AND THROAT: No tinnitus or ear pain.  RESPIRATORY: No cough, shortness of breath, wheezing or hemoptysis.  CARDIOVASCULAR: No chest pain, orthopnea, edema.  GASTROINTESTINAL: No nausea, vomiting, diarrhea or abdominal pain.  GENITOURINARY: No dysuria, hematuria.  ENDOCRINE: No polyuria, nocturia,  HEMATOLOGY: No anemia, easy bruising or bleeding SKIN: No rash or lesion. MUSCULOSKELETAL: Right hip pain with ORIF NEUROLOGIC: No tingling, numbness, weakness.  PSYCHIATRY: No anxiety or depression.   DRUG ALLERGIES:   Allergies  Allergen Reactions  . Neosporin [Neomycin-Polymyxin-Gramicidin] Hives  . Codeine Rash  . Latex Rash  . Penicillins Rash and Other (See Comments)    Has patient had a PCN reaction causing immediate rash, facial/tongue/throat swelling, SOB or lightheadedness with hypotension: Unknown Has patient had a PCN reaction causing severe rash involving mucus membranes or skin necrosis: Unknown Has patient had a PCN reaction that required hospitalization: Unknown Has patient had a PCN reaction occurring within the last 10 years: No If all of the above answers are "NO", then may proceed with Cephalosporin use.     VITALS:  Blood pressure 139/66, pulse 74, temperature 98.6 F (37 C), temperature source Oral, resp. rate 16, height 5\' 5"  (1.651 m), weight 74.4 kg (164  lb), SpO2 95 %.  PHYSICAL EXAMINATION:  GENERAL:  72 y.o.-year-old patient lying in the bed with no acute distress.  EYES: Pupils equal, round, reactive to light and accommodation. No scleral icterus. Extraocular muscles intact.  HEENT: Head atraumatic, normocephalic. Oropharynx and nasopharynx clear.  NECK:  Supple, no jugular venous distention. No thyroid enlargement, no tenderness.  LUNGS: Normal breath sounds bilaterally, no wheezing, rales,rhonchi or crepitation. No use of accessory muscles of respiration.  CARDIOVASCULAR: S1, S2 normal. No murmurs, rubs, or gallops.  ABDOMEN: Soft, nontender, nondistended. Bowel sounds present. No organomegaly or mass.  EXTREMITIES: Right hip surgery, staples present.Marland Kitchen.  NEUROLOGIC: Cranial nerves II through XII are intact. Muscle strength 5/5 in all extremities. Sensation intact. Gait not checked.  PSYCHIATRIC: The patient is alert and oriented x 3.  SKIN: No obvious rash, lesion, or ulcer.    LABORATORY PANEL:   CBC Recent Labs  Lab 07/16/17 0418  WBC 5.3  HGB 10.6*  HCT 30.9*  PLT 147*   ------------------------------------------------------------------------------------------------------------------  Chemistries  Recent Labs  Lab 07/16/17 0418  NA 138  K 4.1  CL 107  CO2 24  GLUCOSE 141*  BUN 13  CREATININE 0.60  CALCIUM 8.2*   ------------------------------------------------------------------------------------------------------------------  Cardiac Enzymes No results for input(s): TROPONINI in the last 168 hours. ------------------------------------------------------------------------------------------------------------------  RADIOLOGY:  Dg Chest 1 View  Result Date: 07/15/2017 CLINICAL DATA:  Preoperative chest radiograph for right hip fracture. Initial encounter. EXAM: CHEST  1 VIEW COMPARISON:  None. FINDINGS: The lungs are well-aerated. Mild prominence of the right hilum is nonspecific but could reflect normal  vasculature. There is no evidence of focal  opacification, pleural effusion or pneumothorax. The cardiomediastinal silhouette is within normal limits. No acute osseous abnormalities are seen. Mild chronic-appearing right-sided rib deformities are noted. IMPRESSION: No acute cardiopulmonary process seen. Mild prominence of the right hilum is nonspecific but could reflect normal vasculature. PA and lateral chest radiographs could be considered on an elective nonemergent basis for further evaluation. Electronically Signed   By: Roanna Raider M.D.   On: 07/15/2017 00:55   Dg Hip Operative Unilat W Or W/o Pelvis Right  Result Date: 07/15/2017 CLINICAL DATA:  Intraoperative fluoro spot images from right hip surgery. EXAM: OPERATIVE right HIP (WITH PELVIS IF PERFORMED) 3 VIEWS TECHNIQUE: Fluoroscopic spot image(s) were submitted for interpretation post-operatively. COMPARISON:  Preoperative study of July 15, 2017 FINDINGS: Three fluoro spot images are reviewed. Reported fluoro time is 34 seconds with a dose of 4.09 mGy. The patient has undergone ORIF for a fracture at the base of the neck. Radiographic positioning of the telescoping nail and intramedullary rod is good where visualized. The fracture has been reduced. IMPRESSION: Intraoperative three view fluoro spot spot series revealing ORIF for fracture involving the base of the right femoral neck. Electronically Signed   By: David  [place] M.D.   On: 07/15/2017 15:34   Dg Hip Unilat  With Pelvis 2-3 Views Right  Result Date: 07/15/2017 CLINICAL DATA:  Status post fall, with acute onset of right hip pain. Initial encounter. EXAM: DG HIP (WITH OR WITHOUT PELVIS) 2-3V RIGHT COMPARISON:  None. FINDINGS: There is a displaced comminuted right femoral intertrochanteric fracture, with medial angulation of the distal femur. The right femoral head remains seated at the acetabulum. The left-sided dynamic hip screw is grossly unremarkable. The left hip joint is unremarkable.  Mild degenerative change is noted at the lower lumbar spine at the sacroiliac joints. The visualized bowel gas pattern is grossly unremarkable. Soft tissue swelling is noted overlying the fracture site. IMPRESSION: Displaced comminuted right femoral intertrochanteric fracture, with medial angulation of the distal femur. Electronically Signed   By: Roanna Raider M.D.   On: 07/15/2017 00:53    EKG:   Orders placed or performed during the hospital encounter of 07/15/17  . EKG 12-Lead  . EKG 12-Lead  . EKG    ASSESSMENT AND PLAN:  Acute right femoral neck fracture status post ORIF by orthopedic yesterday, status postop day 1, patient tolerated the procedure well but has lots of right hip pain, continue IV and p.o. narcotics, DVT prophylaxis patient already started back on apixaban. 2.  Chronic atrial fibrillation: Rate controlled, continue digoxin, apixaban. 3.  Diabetes mellitus type 2: Patient is on sliding scale with coverage, resume metformin 4.  Disposition likely to [facility] rehab tomorrow. Continue senna, Colace for constipation. Plan discussed with patient's husband.   All the records are reviewed and case discussed with Care Management/Social Workerr. Management plans discussed with the patient, family and they are in agreement.  CODE STATUS: Full code  TOTAL TIME TAKING CARE OF THIS PATIENT: 35 minutes.   POSSIBLE D/C IN 1-2 DAYS, DEPENDING ON CLINICAL CONDITION.   Katha Hamming M.D on 07/16/2017 at 4:18 PM  Between 7am to 6pm - Pager - [phone]  After 6pm go to [email or web address] - password [email or web address] [facility]  [facility]  Office  [phone]  CC: Primary care physician; Yisroel Ramming, MD   Note: This dictation was prepared with Dragon dictation along with smaller phrase technology. Any transcriptional errors that result from this process are unintentional.

## 2017-07-17 LAB — GLUCOSE, CAPILLARY
Glucose-Capillary: 96 mg/dL (ref 65–99)
Glucose-Capillary: 179 mg/dL — ABNORMAL HIGH (ref 65–99)
Glucose-Capillary: 191 mg/dL — ABNORMAL HIGH (ref 65–99)

## 2017-07-17 MED ORDER — OXYCODONE HCL 5 MG PO TABS: 5.0000 mg | ORAL_TABLET | ORAL | 0 refills | Status: DC | PRN

## 2017-07-17 NOTE — Discharge Summary (Signed)
[facility] [place] at [facility]   PATIENT NAME: Bethany SpinnerKarla Joseph    MR#:  [id number]  DATE OF BIRTH:  02/14/1945  DATE OF ADMISSION:  07/15/2017 ADMITTING PHYSICIAN: Bethany NatalMichael S Diamond, MD  DATE OF DISCHARGE: 07/17/2017  PRIMARY CARE PHYSICIAN: Bethany Joseph, Bethany L, MD    ADMISSION DIAGNOSIS:  Fall, initial encounter [phone][W19.XXXA] Closed fracture of right hip, initial encounter (HCC) [S72.001A]  DISCHARGE DIAGNOSIS:  Active Problems:   Hip fracture (HCC)   SECONDARY DIAGNOSIS:   Past Medical History:  Diagnosis Date  . Allergic rhinitis   . Aortic valve disorder   . Asthma   . Atrial fibrillation (HCC)   . Avascular necrosis (HCC)   . Diabetes (HCC)   . Hyperlipidemia   . Hyperthyroidism   . Osteoarthritis   . Osteoporosis     HOSPITAL COURSE:   72 year old female with past medical history of hypertension, chronic atrial fibrillation, osteoporosis, osteoarthritis, hypothyroidism, diabetes who presented to the hospital after a mechanical fall and noted to have a right hip fracture.  1.  Status post fall and right hip fracture- patient was admitted to the hospital and a orthopedic consult was obtained.  Patient is status post open reduction internal fixation of the right hip postop day #2 today.   -Patient's pain is well tolerated with oral oxycodone.  She has been evaluated by physical therapy and they recommend short-term rehab which is where presently she is being discharged.  She will follow-up with orthopedics in the next few weeks.  2.  Chronic atrial fibrillation- patient's Eliquis was held for the surgery.  She is currently rate controlled.  She will continue her digoxin, Bystolic.  Her Eliquis has now been resumed.  3.  Hypothyroidism-patient was maintained on her Synthroid and she will resume that.  4.  Diabetes type 2 without complication- patient will continue her metformin.  5.  Hyperlipidemia-patient will resume her Pravachol.  6.   GERD-patient will continue her omeprazole.  7.  Neuropathy-patient will continue her gabapentin.  DISCHARGE CONDITIONS:   Stable  CONSULTS OBTAINED:  Treatment Team:  Bethany Joseph, Michael, MD  DRUG ALLERGIES:   Allergies  Allergen Reactions  . Neosporin [Neomycin-Polymyxin-Gramicidin] Hives  . Codeine Rash  . Latex Rash  . Penicillins Rash and Other (See Comments)    Has patient had a PCN reaction causing immediate rash, facial/tongue/throat swelling, SOB or lightheadedness with hypotension: Unknown Has patient had a PCN reaction causing severe rash involving mucus membranes or skin necrosis: Unknown Has patient had a PCN reaction that required hospitalization: Unknown Has patient had a PCN reaction occurring within the last 10 years: No If all of the above answers are "NO", then may proceed with Cephalosporin use.     DISCHARGE MEDICATIONS:   Allergies as of 07/17/2017      Reactions   Neosporin [neomycin-polymyxin-gramicidin] Hives   Codeine Rash   Latex Rash   Penicillins Rash, Other (See Comments)   Has patient had a PCN reaction causing immediate rash, facial/tongue/throat swelling, SOB or lightheadedness with hypotension: Unknown Has patient had a PCN reaction causing severe rash involving mucus membranes or skin necrosis: Unknown Has patient had a PCN reaction that required hospitalization: Unknown Has patient had a PCN reaction occurring within the last 10 years: No If all of the above answers are "NO", then may proceed with Cephalosporin use.      Medication List    STOP taking these medications   HYDROcodone-acetaminophen 5-325 MG tablet Commonly known as:  NORCO/VICODIN  silver sulfADIAZINE 1 % cream Commonly known as:  SILVADENE     TAKE these medications   aspirin 325 MG tablet Take 325 mg by mouth daily.   bisacodyl 10 MG suppository Commonly known as:  DULCOLAX Place 10 mg rectally as needed for moderate constipation.   digoxin 0.125 MG  tablet Commonly known as:  LANOXIN Take 0.125 mg by mouth daily.   ELIQUIS 5 MG Tabs tablet Generic drug:  apixaban Take 5 mg by mouth every 12 (twelve) hours.   furosemide 20 MG tablet Commonly known as:  LASIX Take 20 mg by mouth daily.   gabapentin 100 MG capsule Commonly known as:  NEURONTIN Take 100 mg by mouth at bedtime.   levothyroxine 25 MCG tablet Commonly known as:  SYNTHROID, LEVOTHROID Take 25 mcg by mouth daily before breakfast.   liothyronine 25 MCG tablet Commonly known as:  CYTOMEL Take 100 mcg by mouth daily.   loratadine 10 MG tablet Commonly known as:  CLARITIN Take 10 mg by mouth daily.   metFORMIN 500 MG 24 hr tablet Commonly known as:  GLUCOPHAGE-XR Take 1,000 mg by mouth every evening.   nebivolol 2.5 MG tablet Commonly known as:  BYSTOLIC Take 2.5 mg by mouth daily as needed (hypertension).   Omega 3 1200 MG Caps Take 1,200 mg by mouth daily.   omeprazole 40 MG capsule Commonly known as:  PRILOSEC TAKE ONE CAPSULE BY MOUTH DAILY   oxyCODONE 5 MG immediate release tablet Commonly known as:  Oxy IR/ROXICODONE Take 1 tablet (5 mg total) by mouth every 4 (four) hours as needed for breakthrough pain.   pravastatin 20 MG tablet Commonly known as:  PRAVACHOL Take 20 mg by mouth.   PROAIR HFA 108 (90 Base) MCG/ACT inhaler Generic drug:  albuterol USE 2 PUFFS TWICE DAILY AS NEEDED shortness of breath   raloxifene 60 MG tablet Commonly known as:  EVISTA TAKE ONE (1) TABLET BY MOUTH EVERY DAY   vitamin E 400 UNIT capsule Take 400 Units by mouth 3 (three) times daily.   zolpidem 5 MG tablet Commonly known as:  AMBIEN Take 5 mg by mouth at bedtime as needed for sleep.         DISCHARGE INSTRUCTIONS:   DIET:  Regular diet and Diabetic diet  DISCHARGE CONDITION:  Stable  ACTIVITY:  Activity as tolerated  OXYGEN:  Home Oxygen: No.   Oxygen Delivery: room air  DISCHARGE LOCATION:  nursing home   If you experience  worsening of your admission symptoms, develop shortness of breath, life threatening emergency, suicidal or homicidal thoughts you must seek medical attention immediately by calling 911 or calling your MD immediately  if symptoms less severe.  You Must read complete instructions/literature along with all the possible adverse reactions/side effects for all the Medicines you take and that have been prescribed to you. Take any new Medicines after you have completely understood and accpet all the possible adverse reactions/side effects.   Please note  You were cared for by a hospitalist during your hospital stay. If you have any questions about your discharge medications or the care you received while you were in the hospital after you are discharged, you can call the unit and asked to speak with the hospitalist on call if the hospitalist that took care of you is not available. Once you are discharged, your primary care physician will handle any further medical issues. Please note that NO REFILLS for any discharge medications will be authorized once you are  discharged, as it is imperative that you return to your primary care physician (or establish a relationship with a primary care physician if you do not have one) for your aftercare needs so that they can reassess your need for medications and monitor your lab values.     Today   No acute events overnight.  Still complaining of some right hip pain.  No chest pain, shortness of breath, nausea, vomiting.  VITAL SIGNS:  Blood pressure (!) 143/74, pulse 77, temperature 98.2 F (36.8 C), temperature source Oral, resp. rate 18, height 5\' 5"  (1.651 m), weight 78.5 kg (173 lb), SpO2 96 %.  I/O:    Intake/Output Summary (Last 24 hours) at 07/17/2017 0912 Last data filed at 07/17/2017 0500 Gross per 24 hour  Intake 1497.5 ml  Output 1550 ml  Net -52.5 ml    PHYSICAL EXAMINATION:  GENERAL:  72 y.o.-year-old patient lying in the bed in no acute distress.   EYES: Pupils equal, round, reactive to light and accommodation. No scleral icterus. Extraocular muscles intact.  HEENT: Head atraumatic, normocephalic. Oropharynx and nasopharynx clear.  NECK:  Supple, no jugular venous distention. No thyroid enlargement, no tenderness.  LUNGS: Normal breath sounds bilaterally, no wheezing, rales,rhonchi. No use of accessory muscles of respiration.  CARDIOVASCULAR: S1, S2 normal. No murmurs, rubs, or gallops.  ABDOMEN: Soft, non-tender, non-distended. Bowel sounds present. No organomegaly or mass.  EXTREMITIES: No pedal edema, cyanosis, or clubbing. Right Hip dressing in place from surgery with no acute bleeding noted.  NEUROLOGIC: Cranial nerves II through XII are intact. No focal motor or sensory defecits b/l.  PSYCHIATRIC: The patient is alert and oriented x 3.  SKIN: No obvious rash, lesion, or ulcer.   DATA REVIEW:   CBC Recent Labs  Lab 07/16/17 0418  WBC 5.3  HGB 10.6*  HCT 30.9*  PLT 147*    Chemistries  Recent Labs  Lab 07/16/17 0418  NA 138  K 4.1  CL 107  CO2 24  GLUCOSE 141*  BUN 13  CREATININE 0.60  CALCIUM 8.2*    Cardiac Enzymes No results for input(s): TROPONINI in the last 168 hours.  Microbiology Results  Results for orders placed or performed during the hospital encounter of 07/15/17  Surgical pcr screen     Status: None   Collection Time: 07/15/17  5:24 AM  Result Value Ref Range Status   MRSA, PCR NEGATIVE NEGATIVE Final   Staphylococcus aureus NEGATIVE NEGATIVE Final    Comment: (NOTE) The Xpert SA Assay (FDA approved for NASAL specimens in patients 60 years of age and older), is one component of a comprehensive surveillance program. It is not intended to diagnose infection nor to guide or monitor treatment. Performed at [facility], [address] [place], [place] [address]     RADIOLOGY:  Dg Hip Operative Unilat W Or W/o Pelvis Right  Result Date: 07/15/2017 CLINICAL DATA:   Intraoperative fluoro spot images from right hip surgery. EXAM: OPERATIVE right HIP (WITH PELVIS IF PERFORMED) 3 VIEWS TECHNIQUE: Fluoroscopic spot image(s) were submitted for interpretation post-operatively. COMPARISON:  Preoperative study of July 15, 2017 FINDINGS: Three fluoro spot images are reviewed. Reported fluoro time is 34 seconds with a dose of 4.09 mGy. The patient has undergone ORIF for a fracture at the base of the neck. Radiographic positioning of the telescoping nail and intramedullary rod is good where visualized. The fracture has been reduced. IMPRESSION: Intraoperative three view fluoro spot spot series revealing ORIF for fracture involving the  base of the right femoral neck. Electronically Signed   By: David  [place] M.D.   On: 07/15/2017 15:34      Management plans discussed with the patient, family and they are in agreement.  CODE STATUS:     Code Status Orders  (From admission, onward)        Start     Ordered   07/15/17 0355  Full code  Continuous     07/15/17 0354      TOTAL TIME TAKING CARE OF THIS PATIENT: 40 minutes.    Houston Siren M.D on 07/17/2017 at 9:12 AM  Between 7am to 6pm - Pager - [phone]  After 6pm go to [email or web address] - Social research officer, government  [facility]  Office  [phone]  CC: Primary care physician; Bethany Ramming, MD

## 2017-07-17 NOTE — Progress Notes (Signed)
Patient is medically stable for D/C [facility] today. [facility] today to [facility] and [facility]. RN will call report at [phone] and arrange EMS for transport. Clinical Child psychotherapistocial Worker (CSW) sent D/C orders to [facility] via [employer]. Patient is aware of above. Patient's daughter Lurena JoinerRebecca is at bedside and aware of above. Please reconsult if future social work needs arise. CSW signing off.   [employer]aker Hughes IncorporatedBailey Hawa Henly, LCSW [phone]

## 2017-07-17 NOTE — Progress Notes (Signed)
   Subjective: 2 Days Post-Op Procedure(s) (LRB): INTRAMEDULLARY (IM) NAIL INTERTROCHANTRIC (Right) Patient reports pain as mild to moderate.   Patient is well, and has had no acute complaints or problems Denies any CP, SOB, ABD pain. We will continue therapy today.    Objective: Vital signs in last 24 hours: Temp:  [98 F (36.7 C)-99.7 F (37.6 C)] 99.6 F (37.6 C) (06/04 2317) Pulse Rate:  [73-86] 73 (06/04 2317) Resp:  [16-20] 18 (06/04 2317) BP: (114-152)/(53-73) 114/63 (06/04 2317) SpO2:  [89 %-99 %] 93 % (06/04 2317) Weight:  [78.5 kg (173 lb)] 78.5 kg (173 lb) (06/05 0500)  Intake/Output from previous day: 06/04 0701 - 06/05 0700 In: 1497.5 [P.O.:460; I.V.:1037.5] Out: 1550 [Urine:1550] Intake/Output this shift: No intake/output data recorded.  Recent Labs    07/15/17 0111 07/16/17 0418  HGB 11.5* 10.6*   Recent Labs    07/15/17 0111 07/16/17 0418  WBC 4.5 5.3  RBC 3.67* 3.33*  HCT 34.2* 30.9*  PLT 176 147*   Recent Labs    07/15/17 0111 07/16/17 0418  NA 137 138  K 4.2 4.1  CL 103 107  CO2 22 24  BUN 21* 13  CREATININE 0.67 0.60  GLUCOSE 132* 141*  CALCIUM 8.8* 8.2*   No results for input(s): LABPT, INR in the last 72 hours.  EXAM General - Patient is Alert, Appropriate and Oriented Extremity - Neurologically intact Neurovascular intact Sensation intact distally Intact pulses distally Dorsiflexion/Plantar flexion intact No cellulitis present Compartment soft Dressing - dressing C/D/I and scant drainage Motor Function - intact, moving foot and toes well on exam.   Past Medical History:  Diagnosis Date  . Allergic rhinitis   . Aortic valve disorder   . Asthma   . Atrial fibrillation (HCC)   . Avascular necrosis (HCC)   . Diabetes (HCC)   . Hyperlipidemia   . Hyperthyroidism   . Osteoarthritis   . Osteoporosis     Assessment/Plan:   2 Days Post-Op Procedure(s) (LRB): INTRAMEDULLARY (IM) NAIL INTERTROCHANTRIC (Right) Active  Problems:   Hip fracture (HCC)  Estimated body mass index is 28.79 kg/m as calculated from the following:   Height as of this encounter: 5\' 5"  (1.651 m).   Weight as of this encounter: 78.5 kg (173 lb). Advance diet Up with therapy  Needs BM Labs and vital signs stable Pain controlled  Follow up with [facility] in 6 weeks for xrays of right femur Remove staples and apply steri strips on 07/29/2017  DVT Prophylaxis - Foot Pumps, TED hose and eliquis Weight-Bearing as tolerated to right leg   Dedra Skeensodd Tangala Wiegert, PA-C [facility] 07/17/2017, 7:08 AM

## 2017-07-17 NOTE — Evaluation (Signed)
Occupational Therapy Evaluation Patient Details Name: Bethany Joseph MRN: [id number] DOB: 09/11/1945 Today's Date: 07/17/2017    History of Present Illness Pt admitted for R hip fracture and is now s/p ORIF. Pt complaints of fall. History includes Afib, DM, avascular necrosis, and B TKR.   Clinical Impression   Pt seen for OT evaluation this date, POD#2 from above surgery. Pt was independent in all ADLs prior to surgery, using a rollator for mobility at night and outside the home (per dtr pt generally does not use an AD during the day). Pt reports chronic pain and multiple falls 2:2 a loss of balance. Pt very pain limited at time of evaluation, RN provided pain meds during evaluation. Pt declined all bed mobility and OOB ADL 2:2 pain. Indep after set up for grooming and self feeding after set up of items on her tray table. Pt is eager to have less pain and return to PLOF with less pain and improved safety and independence. Pt currently requires MAX A for LB bathing and dressing at bed level 2:2 pain and decreased strength/ROM. Pt/dtr instructed in falls prevention strategies and cognitive behavioral pain coping strategies. Pt very distractible t/o. Pt would benefit from skilled OT services in order to maximize return to PLOF, minimize risk of future falls/injury/rehospitalization, and minimize need for increased caregiver burden or higher level of care. Recommend STR upon discharge.      Follow Up Recommendations  SNF    Equipment Recommendations  None recommended by OT    Recommendations for Other Services       Precautions / Restrictions Precautions Precautions: Fall Restrictions Weight Bearing Restrictions: Yes RLE Weight Bearing: Weight bearing as tolerated      Mobility Bed Mobility Overal bed mobility: Needs Assistance             General bed mobility comments: max assist x2 for scooting up in bed, pt refused to assist or attempt sitting EOB with lateral scoots to improve  positioning in bed 2:2 pain  Transfers                 General transfer comment: unable to attempt, pt refusing 2:2 pain    Balance Overall balance assessment: History of Falls;Needs assistance                                         ADL either performed or assessed with clinical judgement   ADL Overall ADL's : Needs assistance/impaired Eating/Feeding: Bed level;Independent Eating/Feeding Details (indicate cue type and reason): indep after set up of items on tray table Grooming: Bed level;Independent Grooming Details (indicate cue type and reason): indep after set up of items on tray table Upper Body Bathing: Bed level;Minimal assistance   Lower Body Bathing: Bed level;Maximal assistance   Upper Body Dressing : Bed level;Minimal assistance   Lower Body Dressing: Bed level;Maximal assistance     Toilet Transfer Details (indicate cue type and reason): unable to attempt 2:2 pain                 Vision Baseline Vision/History: Wears glasses Wears Glasses: At all times Patient Visual Report: No change from baseline       Perception     Praxis      Pertinent Vitals/Pain Pain Assessment: 0-10 Pain Score: 8  Pain Location: R hip Pain Descriptors / Indicators: Operative site guarding;Grimacing;Aching Pain Intervention(s): Limited activity within  patient's tolerance;Monitored during session;Repositioned;RN gave pain meds during session     Hand Dominance Right   Extremity/Trunk Assessment Upper Extremity Assessment Upper Extremity Assessment: Generalized weakness(grossly 4/5 bilaterally)   Lower Extremity Assessment Lower Extremity Assessment: Generalized weakness;Defer to PT evaluation(R LE grossly 2+/5; L LE grossly 4/5)   Cervical / Trunk Assessment Cervical / Trunk Assessment: Normal   Communication Communication Communication: No difficulties   Cognition Arousal/Alertness: Awake/alert Behavior During Therapy: WFL for tasks  assessed/performed Overall Cognitive Status: Within Functional Limits for tasks assessed                                     General Comments       Exercises Other Exercises Other Exercises: Pt/dtr educated in cognitive behavioral strategies for pain mgt including distraction, pleasant imagery, progressive muscle relaxation, and deep breathing. Pt very distractible t/o. Other Exercises: OT attempted to problem solve with pt regarding history of repeated falls. Pt distracted, denies dtr's assertions of possible causes at times.    Shoulder Instructions      Home Living Family/patient expects to be discharged to:: Private residence Living Arrangements: Spouse/significant other Available Help at Discharge: Family;Available 24 hours/day;Available PRN/intermittently(spouse 24/7, daughter/family PRN) Type of Home: House Home Access: Stairs to enter [employer] of Steps: 5 Entrance Stairs-Rails: Left;Right Home Layout: One level     Bathroom Shower/Tub: Tub/shower unit;Walk-in shower   Bathroom Toilet: Handicapped height(BSC frame over standard toilet)     Home Equipment: Walker - 2 wheels;Walker - 4 wheels;Cane - single point;Bedside commode;Shower seat - built in;Hand held shower head          Prior Functioning/Environment Level of Independence: Independent with assistive device(s)        Comments: Pt uses rollater at baseline, however reports multiple falls at baseline 2:2 a LOB. Per dtr, pt uses rollator at night but generally not during the day. Per dtr, pt primarily seem to fall at night when she has taken more pain meds and is groggy. Pt endorses difficulty sleeping 2:2 chronic pain.  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        OT Problem List: Decreased strength;Decreased knowledge of use of DME or AE;Decreased range of motion;Pain;Decreased activity tolerance;Decreased safety  awareness;Impaired balance (sitting and/or standing)      OT Treatment/Interventions: Self-care/ADL training;Balance training;Therapeutic exercise;Therapeutic activities;DME and/or AE instruction;Patient/family education    OT Goals(Current goals can be found in the care plan section) Acute Rehab OT Goals Patient Stated Goal: have less pain OT Goal Formulation: With patient/family Time For Goal Achievement: 07/31/17 Potential to Achieve Goals: Good ADL Goals Pt Will Perform Lower Body Dressing: with min assist;with adaptive equipment;sitting/lateral leans Pt Will Transfer to Toilet: with mod assist;stand pivot transfer;bedside commode(LRAD) Additional ADL Goal #1: Pt will verbalize plan to implement at least 1 learned falls prevention strategy in the home to minimize future falls risk. Additional ADL Goal #2: Pt will demonstrate understanding of learned cognitive behavioral pain coping strategies and verbalize plan to implement at least 1 into her daily routine to improve self-mgt of pain.  OT Frequency: Min 2X/week   Barriers to D/C:            Co-evaluation              AM-PAC PT "6 Clicks" Daily Activity     Outcome Measure Help from another person eating  meals?: None Help from another person taking care of personal grooming?: None Help from another person toileting, which includes using toliet, bedpan, or urinal?: A Lot Help from another person bathing (including washing, rinsing, drying)?: A Lot Help from another person to put on and taking off regular upper body clothing?: A Little Help from another person to put on and taking off regular lower body clothing?: A Lot 6 Click Score: 17   End of Session    Activity Tolerance: Patient limited by pain Patient left: in bed;with call bell/phone within reach;with bed alarm set;with family/visitor present  OT Visit Diagnosis: Other abnormalities of gait and mobility (R26.89);Repeated falls (R29.6);Muscle weakness  (generalized) (M62.81);Pain Pain - Right/Left: Right Pain - part of body: Hip                Time: 1610-9604 OT Time Calculation (min): 38 min Charges:  OT General Charges $OT Visit: 1 Visit OT Evaluation $OT Eval Moderate Complexity: 1 Mod OT Treatments $Self Care/Home Management : 23-37 mins  Richrd Prime, MPH, MS, OTR/L ascom [phone] 07/17/17, 9:30 AM

## 2017-07-17 NOTE — Progress Notes (Signed)
[facility] called and [facility]. Supp[facility] given to pt. Waiting on pt to have BM then will call EMS for discharge.

## 2017-07-17 NOTE — Clinical Social Work Placement (Signed)
   CLINICAL SOCIAL WORK PLACEMENT  NOTE  Date:  07/17/2017  Patient Details  Name: Georgiana SpinnerKarla Dobkins MRN: [id number] Date of Birth: 04/02/1945  Clinical Social Work is seeking post-discharge placement for this patient at the Skilled  Nursing Facility level of care (*CSW will initial, date and re-position this form in  chart as items are completed):  Yes   Patient/family provided with [facility] Clinical Social Work Department's list of facilities offering this level of care within the geographic area requested by the patient (or if unable, by the patient's family).  Yes   Patient/family informed of their freedom to choose among providers that offer the needed level of care, that participate in Medicare, Medicaid or managed care program needed by the patient, have an available bed and are willing to accept the patient.  Yes   Patient/family informed of [facility]'s ownership interest in [facility] and [facility], as well as of the fact that they are under no obligation to receive care at these facilities.  PASRR submitted to EDS on       PASRR number received on       Existing PASRR number confirmed on 07/15/17     FL2 transmitted to all facilities in geographic area requested by pt/family on 07/15/17     FL2 transmitted to all facilities within larger geographic area on       Patient informed that his/her managed care company has contracts with or will negotiate with certain facilities, including the following:        Yes   Patient/family informed of bed offers received.  Patient chooses bed at [facility] )     Physician recommends and patient chooses bed at      Patient to be transferred to [facility] ) on 07/17/17.  Patient to be transferred to facility by [facility] )     Patient family notified on 07/17/17 of transfer.  Name of family member notified:  (Patient's daughter Lurena JoinerRebecca is at bedside and aware of D/C today. )     PHYSICIAN        Additional Comment:    _______________________________________________ Shawnetta Lein, [name], LCSW 07/17/2017, 9:45 AM

## 2017-07-17 NOTE — Progress Notes (Signed)
Awake in bed. Repositioned. Medicated for pain per mar. Call bell in reach.

## 2017-07-17 NOTE — Progress Notes (Signed)
Physical Therapy Treatment Patient Details Name: Bethany Joseph MRN: [id number] DOB: 04/15/1945 Today's Date: 07/17/2017    History of Present Illness Pt admitted for R hip fracture and is now s/p ORIF. Pt complaints of fall. History includes Afib, DM, avascular necrosis, and B TKR.    PT Comments    Pt is making good progress towards goals with increased functional mobility performed this date. Able to transfer and ambulate to recliner with +2 assist and RW. Good endurance with there-ex performed and pt appears less anxious this date. Still limited by pain. Gave written HEP for performance. Will continue to progress.   Follow Up Recommendations  SNF     Equipment Recommendations  None recommended by PT    Recommendations for Other Services       Precautions / Restrictions Precautions Precautions: Fall Restrictions Weight Bearing Restrictions: Yes RLE Weight Bearing: Weight bearing as tolerated    Mobility  Bed Mobility Overal bed mobility: Needs Assistance Bed Mobility: Supine to Sit     Supine to sit: +2 for safety/equipment;+2 for physical assistance;Max assist     General bed mobility comments: mod assist with +2 assist for sliding B LEs off bed and trunk support. Pt able to sit with upright posture and improved technique this session. Less pain with movement.  Transfers Overall transfer level: Needs assistance Equipment used: Rolling walker (2 wheeled) Transfers: Sit to/from Stand Sit to Stand: Mod assist;+2 physical assistance         General transfer comment: +2 with RW with pt able to fully stand with upright posture and WBing on R LE. Safe technique  Ambulation/Gait Ambulation/Gait assistance: Mod assist;+2 physical assistance Ambulation Distance (Feet): 3 Feet Assistive device: Rolling walker (2 wheeled) Gait Pattern/deviations: Step-to pattern     General Gait Details: slow and cautious steps towards recliner with upright posture noted. Slow  technique and pt very anxious with any movement. Needs cues for sequencing. RW used for ambulation   Social research officer, governmenttairs             Wheelchair Mobility    Modified Rankin (Stroke Patients Only)       Balance Overall balance assessment: History of Falls;Needs assistance                                          Cognition Arousal/Alertness: Awake/alert Behavior During Therapy: WFL for tasks assessed/performed Overall Cognitive Status: Within Functional Limits for tasks assessed                                        Exercises Other Exercises Other Exercises: Pt/dtr educated in cognitive behavioral strategies for pain mgt including distraction, pleasant imagery, progressive muscle relaxation, and deep breathing. Pt very distractible t/o. Other Exercises: OT attempted to problem solve with pt regarding history of repeated falls. Pt distracted, denies dtr's assertions of possible causes at times.  Other Exercises: supine ther-ex performed on R LE including ankle pumps, quad sets, glut sets, SLRs, hip abd/add, hip add squeezes, and SAQ. All ther-ex performed x 12 reps with min assist and safe technique Other Exercises: also performed multiple reps of rolling in bed for diaper placement with min/mod assist.    General Comments        Pertinent Vitals/Pain Pain Assessment: 0-10 Pain Score: 8  Pain Location:  R hip Pain Descriptors / Indicators: Operative site guarding;Grimacing;Aching Pain Intervention(s): Limited activity within patient's tolerance;Repositioned    Home Living Family/patient expects to be discharged to:: Private residence Living Arrangements: Spouse/significant other Available Help at Discharge: Family;Available 24 hours/day;Available PRN/intermittently(spouse 24/7, daughter/family PRN) Type of Home: House Home Access: Stairs to enter Entrance Stairs-Rails: Left;Right Home Layout: One level Home Equipment: Environmental consultant - 2 wheels;Walker -  4 wheels;Cane - single point;Bedside commode;Shower seat - built in;Hand held shower head      Prior Function Level of Independence: Independent with assistive device(s)      Comments: Pt uses rollater at baseline, however reports multiple falls at baseline 2:2 a LOB. Per dtr, pt uses rollator at night but generally not during the day. Per dtr, pt primarily seem to fall at night when she has taken more pain meds and is groggy. Pt endorses difficulty sleeping 2:2 chronic pain.  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   PT Goals (current goals can now be found in the care plan section) Acute Rehab PT Goals Patient Stated Goal: have less pain PT Goal Formulation: With patient Time For Goal Achievement: 07/30/17 Potential to Achieve Goals: Good Progress towards PT goals: Progressing toward goals    Frequency    BID      PT Plan Current plan remains appropriate    Co-evaluation              AM-PAC PT "6 Clicks" Daily Activity  Outcome Measure  Difficulty turning over in bed (including adjusting bedclothes, sheets and blankets)?: Unable Difficulty moving from lying on back to sitting on the side of the bed? : Unable Difficulty sitting down on and standing up from a chair with arms (e.g., wheelchair, bedside commode, etc,.)?: Unable Help needed moving to and from a bed to chair (including a wheelchair)?: A Lot Help needed walking in hospital room?: A Lot Help needed climbing 3-5 steps with a railing? : Total 6 Click Score: 8    End of Session Equipment Utilized During Treatment: Gait belt Activity Tolerance: Patient limited by pain Patient left: in chair;with chair alarm set;with SCD's reapplied Nurse Communication: Mobility status PT Visit Diagnosis: Repeated falls (R29.6);Muscle weakness (generalized) (M62.81);Difficulty in walking, not elsewhere classified (R26.2);Pain Pain - Right/Left: Right Pain - part of body:  Hip     Time: 4098-1191 PT Time Calculation (min) (ACUTE ONLY): 43 min  Charges:  $Gait Training: 8-22 mins $Therapeutic Exercise: 23-37 mins                    G Codes:       Elizabeth Palau, PT, DPT [phone]    Atticus Lemberger 07/17/2017, 11:08 AM

## 2017-07-17 NOTE — Progress Notes (Signed)
Sleeping on rounds, resp easy 

## 2017-07-17 NOTE — Progress Notes (Signed)
Medicated for pain per mar and repositioned in bed. Ext cath working approply, u/o good. Call bell in reach.

## 2017-07-17 NOTE — Discharge Instructions (Signed)
INSTRUCTIONS AFTER Surgery  o Remove items at home which could result in a fall. This includes throw rugs or furniture in walking pathways o ICE to the affected joint every three hours while awake for 30 minutes at a time, for at least the first 3-5 days, and then as needed for pain and swelling.  Continue to use ice for pain and swelling. You may notice swelling that will progress down to the foot and ankle.  This is normal after surgery.  Elevate your leg when you are not up walking on it.   o Continue to use the breathing machine you got in the hospital (incentive spirometer) which will help keep your temperature down.  It is common for your temperature to cycle up and down following surgery, especially at night when you are not up moving around and exerting yourself.  The breathing machine keeps your lungs expanded and your temperature down.   DIET:  As you were doing prior to hospitalization, we recommend a well-balanced diet.  DRESSING / WOUND CARE / SHOWERING  Change dressing as needed.  Keep dry.  Staples will be removed at [facility] in 2 weeks.    ACTIVITY  o Increase activity slowly as tolerated, but follow the weight bearing instructions below.   o No driving for 6 weeks or until further direction given by your physician.  You cannot drive while taking narcotics.  o No lifting or carrying greater than 10 lbs. until further directed by your surgeon. o Avoid periods of inactivity such as sitting longer than an hour when not asleep. This helps prevent blood clots.  o You may return to work once you are authorized by your doctor.     WEIGHT BEARING  Weight bearing as tolerated   EXERCISES Gait training and ROM with strengthening.  ADLs  CONSTIPATION  Constipation is defined medically as fewer than three stools per week and severe constipation as less than one stool per week.  Even if you have a regular bowel pattern at home, your normal regimen is likely to be disrupted  due to multiple reasons following surgery.  Combination of anesthesia, postoperative narcotics, change in appetite and fluid intake all can affect your bowels.   YOU MUST use at least one of the following options; they are listed in order of increasing strength to get the job done.  They are all available over the counter, and you may need to use some, POSSIBLY even all of these options:    Drink plenty of fluids (prune juice may be helpful) and high fiber foods Colace 100 mg by mouth twice a day  Senokot for constipation as directed and as needed Dulcolax (bisacodyl), take with full glass of water  Miralax (polyethylene glycol) once or twice a day as needed.  If you have tried all these things and are unable to have a bowel movement in the first 3-4 days after surgery call either your surgeon or your primary doctor.    If you experience loose stools or diarrhea, hold the medications until you stool forms back up.  If your symptoms do not get better within 1 week or if they get worse, check with your doctor.  If you experience "the worst abdominal pain ever" or develop nausea or vomiting, please contact the office immediately for further recommendations for treatment.   ITCHING:  If you experience itching with your medications, try taking only a single pain pill, or even half a pain pill at a time.  You can also use Benadryl over the counter for itching or also to help with sleep.   TED HOSE STOCKINGS:  Use stockings on both legs until for at least 2 weeks or as directed by physician office. They may be removed at night for sleeping.  MEDICATIONS:  See your medication summary on the After Visit Summary that nursing will review with you.  You may have some home medications which will be placed on hold until you complete the course of blood thinner medication.  It is important for you to complete the blood thinner medication as prescribed.  PRECAUTIONS:  If you experience chest pain or shortness  of breath - call 911 immediately for transfer to the hospital emergency department.   If you develop a fever greater that 101 F, purulent drainage from wound, increased redness or drainage from wound, foul odor from the wound/dressing, or calf pain - CONTACT YOUR SURGEON.                                                   FOLLOW-UP APPOINTMENTS:  If you do not already have a post-op appointment, please call the office for an appointment to be seen by your surgeon.  Guidelines for how soon to be seen are listed in your After Visit Summary, but are typically between 1-4 weeks after surgery.  OTHER INSTRUCTIONS:     MAKE SURE YOU:   Understand these instructions.   Get help right away if you are not doing well or get worse.    Thank you for letting us be a part of your medical care team.  It is a privilege we respect greatly.  We hope these instructions will help you stay on track for a fast and full recovery!   Information on my medicine - ELIQUIS (apixaban)  Why was Eliquis prescribed for you? Eliquis was prescribed for you to reduce the risk of forming blood clots that can cause a stroke if you have a medical condition called atrial fibrillation (a type of irregular heartbeat) OR to reduce the risk of a blood clots forming after orthopedic surgery.  What do You need to know about Eliquis ? Take your Eliquis TWICE DAILY - one tablet in the morning and one tablet in the evening with or without food.  It would be best to take the doses about the same time each day.  If you have difficulty swallowing the tablet whole please discuss with your pharmacist how to take the medication safely.  Take Eliquis exactly as prescribed by your doctor and DO NOT stop taking Eliquis without talking to the doctor who prescribed the medication.  Stopping may increase your risk of developing a new clot or stroke.  Refill your prescription before you run out.  After discharge, you should have regular  check-up appointments with your healthcare provider that is prescribing your Eliquis.  In the future your dose may need to be changed if your kidney function or weight changes by a significant amount or as you get older.  What do you do if you miss a dose? If you miss a dose, take it as soon as you remember on the same day and resume taking twice daily.  Do not take more than one dose of ELIQUIS at the same time.  Important Safety Information A possible side effect of Eliquis  is bleeding. You should call your healthcare provider right away if you experience any of the following: ? Bleeding from an injury or your nose that does not stop. ? Unusual colored urine (red or dark brown) or unusual colored stools (red or black). ? Unusual bruising for unknown reasons. ? A serious fall or if you hit your head (even if there is no bleeding).  Some medicines may interact with Eliquis and might increase your risk of bleeding or clotting while on Eliquis. To help avoid this, consult your healthcare provider or pharmacist prior to using any new prescription or non-prescription medications, including herbals, vitamins, non-steroidal anti-inflammatory drugs (NSAIDs) and supplements.  This website has more information on Eliquis (apixaban): [email or web address].

## 2017-07-23 ENCOUNTER — Other Ambulatory Visit: Payer: Self-pay

## 2017-07-23 MED ORDER — OXYCODONE HCL 5 MG PO TABS: 5.0000 mg | ORAL_TABLET | ORAL | 0 refills | Status: DC | PRN

## 2017-07-23 NOTE — Telephone Encounter (Signed)
Rx sent to [facility] phone : [phone] , fax : [phone]  

## 2017-07-26 ENCOUNTER — Non-Acute Institutional Stay (SKILLED_NURSING_FACILITY): Payer: Medicare Other | Admitting: Gerontology

## 2017-07-26 ENCOUNTER — Encounter: Payer: Self-pay | Admitting: Gerontology

## 2017-07-26 DIAGNOSIS — Z8781 Personal history of (healed) traumatic fracture: Secondary | ICD-10-CM | POA: Diagnosis not present

## 2017-07-26 DIAGNOSIS — H6123 Impacted cerumen, bilateral: Secondary | ICD-10-CM

## 2017-07-26 DIAGNOSIS — Z967 Presence of other bone and tendon implants: Secondary | ICD-10-CM | POA: Diagnosis not present

## 2017-07-26 DIAGNOSIS — Z9889 Other specified postprocedural states: Secondary | ICD-10-CM

## 2017-07-26 DIAGNOSIS — S42409A Unspecified fracture of lower end of unspecified humerus, initial encounter for closed fracture: Secondary | ICD-10-CM | POA: Insufficient documentation

## 2017-07-26 DIAGNOSIS — S72001D Fracture of unspecified part of neck of right femur, subsequent encounter for closed fracture with routine healing: Secondary | ICD-10-CM | POA: Diagnosis not present

## 2017-07-26 NOTE — Progress Notes (Signed)
Location:   [facility] Room Number: [phone] Place of Service:  SNF [id number])  Provider: Lorenso QuarryShannon Natthew Marlatt, NP-C  PCP: [name], Whitman L, MD Patient Care Team: [name], Whitman L, MD as PCP - General (Internal Medicine)  Extended Emergency Contact Information Primary Emergency Contact: Carriger,Robert Address: [address]          [place], [place] [id number] [place] Home Phone: [phone] Relation: Spouse Secondary Emergency Contact: Beacher MayParrish,Rebecca  [place] Home Phone: [phone] Relation: Daughter  Code Status: FULL Goals of care:  Advanced Directive information Advanced Directives 07/26/2017  Does Patient Have a Medical Advance Directive? Yes  Type of Advance Directive Healthcare Power of Attorney  Does patient want to make changes to medical advance directive? No - Patient declined  Copy of Healthcare Power of Attorney in Chart? Yes  Would patient like information on creating a medical advance directive? -     Allergies  Allergen Reactions  . Neosporin [Neomycin-Polymyxin-Gramicidin] Hives  . Tape     Other reaction(s): Unknown Adhesive bandage  . Codeine Rash  . Latex Rash  . Neomycin-Bacitracin Zn-Polymyx Rash    Other reaction(s): Unknown DERMATOLOGICALS   . Penicillins Rash and Other (See Comments)    Has patient had a PCN reaction causing immediate rash, facial/tongue/throat swelling, SOB or lightheadedness with hypotension: Unknown Has patient had a PCN reaction causing severe rash involving mucus membranes or skin necrosis: Unknown Has patient had a PCN reaction that required hospitalization: Unknown Has patient had a PCN reaction occurring within the last 10 years: No If all of the above answers are "NO", then may proceed with Cephalosporin use.     Chief Complaint  Patient presents with  . Discharge Note    Discharged from SNF    HPI:  72 y.o. female seen today for discharge evaluation.   Patient was admitted to the facility for rehab [facility] mechanical fall with right hip fracture and subsequent ORIF of the right hip.  Patient has been participating in PT/OT patient reports pain is fairly well controlled on current regimen.  Patient reports appetite is fair.  Voiding well and having regular BMs.  Patient does complain of decreased hearing in both ears.  Patient was noted to have significant cerumen burden.  Patient was started on Debrox to continue at home.  Otherwise patient reports she is feeling fairly well and is ready to go home.  She has not been sleeping well at night and ready for a good night sleep.  Vital signs stable.    Past Medical History:  Diagnosis Date  . Acquired hypothyroidism    Diagnosed with peripheral resistance  . Allergic rhinitis   . Aortic valve disorder   . Arrhythmia   . Arthritis   . Asthma   . Asthma without status asthmaticus    unspecified  . At risk for falls    uses cane, arthritis  . Atrial fibrillation (HCC)    Permanent at this time CHADSVASc=3 (age 72, DM, female) 05/30/09 - Afib at 107 4/25 A fib 88 with better rate control on Multaq, but dc Multaq since in permanent A Fib, refuses Coumadin therapy   . Avascular necrosis (HCC)    left foot  . Diabetes (HCC)   . Diabetic neuropathy (HCC)    unspecified  . Difficult intubation    has a difficult intubation card  . Fracture of distal femur (HCC)    left  periprosthetic, status post ORIF  . GERD (gastroesophageal reflux disease)   . Gout   . Heart murmur   . History of atrial fibrillation   . History of chicken pox   . Humerus distal fracture    left - in[place] on 05/21/12  . Hyperkeratosis    Pre-ulcerative hyperkeratotic lesions first MTPJs  . Hyperlipidemia   . Hypertension   . Hyperthyroidism   . Mycotic toenails   . Osteoarthritis   . Osteoporosis, post-menopausal    Status post bilateral knee replacement. DEXA 09/2009: t=-1.6 spine, t=-3.1  right fem neck  . Paroxysmal tachycardia (HCC)    now in chronic A fib but rate is well controlled  . Type II diabetes mellitus (HCC)     Past Surgical History:  Procedure Laterality Date  . CHOLECYSTECTOMY    . FEMUR FRACTURE SURGERY Left 2010  . INTRAMEDULLARY (IM) NAIL INTERTROCHANTERIC Right 07/15/2017   Procedure: INTRAMEDULLARY (IM) NAIL INTERTROCHANTRIC;  Surgeon: Kennedy Bucker, MD;  Location: [facility] ORS;  Service: Orthopedics;  Laterality: Right;  . JOINT REPLACEMENT  2007   knee replacement surgeries  . ORIF FIBULA FRACTURE    . ORIF HUMERUS FRACTURE Left 05/28/2012   supracondylar distal  . REPLACEMENT TOTAL KNEE BILATERAL    . THYROIDECTOMY  1975   [place]  . TOTAL ABDOMINAL HYSTERECTOMY W/ BILATERAL SALPINGOOPHORECTOMY  1983   [place]  . TOTAL KNEE ARTHROPLASTY Bilateral       reports that she quit smoking about 38 years ago. Her smoking use included cigarettes. She has never used smokeless tobacco. She reports that she drinks about 8.4 oz of alcohol per week. She reports that she does not use drugs. Social History   Socioeconomic History  . Marital status: Married    Spouse name: Molly Maduro  . Number of children: 2  . Years of education: 38  . Highest education level: High school graduate  Occupational History  . Not on file  Social Needs  . Financial resource strain: Not on file  . Food insecurity:    Worry: Not on file    Inability: Not on file  . Transportation needs:    Medical: Not on file    Non-medical: Not on file  Tobacco Use  . Smoking status: Former Smoker    Types: Cigarettes    Last attempt to quit: 02/13/1979    Years since quitting: 38.4  . Smokeless tobacco: Never Used  Substance and Sexual Activity  . Alcohol use: Yes    Alcohol/week: 8.4 oz    Types: 14 Glasses of wine per week  . Drug use: No  . Sexual activity: Not Currently  Lifestyle  . Physical activity:    Days per week: Not on file    Minutes per session: Not on file  . Stress: Not on  file  Relationships  . Social connections:    Talks on phone: Not on file    Gets together: Not on file    Attends religious service: Not on file    Active member of club or organization: Not on file    Attends meetings of clubs or organizations: Not on file    Relationship status: Not on file  . Intimate partner violence:    Fear of current or ex partner: Not on file    Emotionally abused: Not on file    Physically abused: Not on file    Forced sexual activity: Not on file  Other Topics Concern  . Not on file  Social  History Narrative  . Not on file   Functional Status Survey:    Allergies  Allergen Reactions  . Neosporin [Neomycin-Polymyxin-Gramicidin] Hives  . Tape     Other reaction(s): Unknown Adhesive bandage  . Codeine Rash  . Latex Rash  . Neomycin-Bacitracin Zn-Polymyx Rash    Other reaction(s): Unknown DERMATOLOGICALS   . Penicillins Rash and Other (See Comments)    Has patient had a PCN reaction causing immediate rash, facial/tongue/throat swelling, SOB or lightheadedness with hypotension: Unknown Has patient had a PCN reaction causing severe rash involving mucus membranes or skin necrosis: Unknown Has patient had a PCN reaction that required hospitalization: Unknown Has patient had a PCN reaction occurring within the last 10 years: No If all of the above answers are "NO", then may proceed with Cephalosporin use.     Pertinent  Health Maintenance Due  Topic Date Due  . FOOT EXAM  05/15/1955  . OPHTHALMOLOGY EXAM  05/15/1955  . URINE MICROALBUMIN  05/15/1955  . MAMMOGRAM  05/15/1994  . COLONOSCOPY  05/15/1994  . DEXA SCAN  05/14/2009  . PNA vac Low Risk Adult (1 of 2 - PCV13) 05/14/2009  . INFLUENZA VACCINE  09/12/2017  . HEMOGLOBIN A1C  01/14/2018    Medications: Allergies as of 07/26/2017      Reactions   Neosporin [neomycin-polymyxin-gramicidin] Hives   Tape    Other reaction(s): Unknown Adhesive bandage   Codeine Rash   Latex Rash    Neomycin-bacitracin Zn-polymyx Rash   Other reaction(s): Unknown DERMATOLOGICALS   Penicillins Rash, Other (See Comments)   Has patient had a PCN reaction causing immediate rash, facial/tongue/throat swelling, SOB or lightheadedness with hypotension: Unknown Has patient had a PCN reaction causing severe rash involving mucus membranes or skin necrosis: Unknown Has patient had a PCN reaction that required hospitalization: Unknown Has patient had a PCN reaction occurring within the last 10 years: No If all of the above answers are "NO", then may proceed with Cephalosporin use.      Medication List        Accurate as of 07/26/17 10:52 AM. Always use your most recent med list.          acetaminophen 325 MG tablet Commonly known as:  TYLENOL Take 650 mg by mouth every 4 (four) hours as needed.   aspirin 325 MG tablet Take 325 mg by mouth daily.   bisacodyl 10 MG suppository Commonly known as:  DULCOLAX Place 10 mg rectally as needed for moderate constipation.   carbamide peroxide 6.5 % OTIC solution Commonly known as:  DEBROX Place 1 drop into both ears 2 (two) times daily.   Cholecalciferol 4000 units Caps Take 1 capsule by mouth daily.   digoxin 0.125 MG tablet Commonly known as:  LANOXIN Take 0.125 mg by mouth daily.   ELIQUIS 5 MG Tabs tablet Generic drug:  apixaban Take 5 mg by mouth every 12 (twelve) hours.   furosemide 20 MG tablet Commonly known as:  LASIX Take 20 mg by mouth daily.   gabapentin 400 MG capsule Commonly known as:  NEURONTIN Take 400 mg by mouth at bedtime.   levothyroxine 25 MCG tablet Commonly known as:  SYNTHROID, LEVOTHROID Take 25 mcg by mouth daily before breakfast.   liothyronine 25 MCG tablet Commonly known as:  CYTOMEL Take 100 mcg by mouth daily. 4 tabs   loratadine 10 MG tablet Commonly known as:  CLARITIN Take 10 mg by mouth daily.   metFORMIN 500 MG 24 hr tablet Commonly known  as:  GLUCOPHAGE-XR Take 1,000 mg by mouth  every evening.   nebivolol 2.5 MG tablet Commonly known as:  BYSTOLIC Take 2.5 mg by mouth daily as needed (hypertension).   Omega 3 1200 MG Caps Take 1,200 mg by mouth daily.   omeprazole 40 MG capsule Commonly known as:  PRILOSEC TAKE ONE CAPSULE BY MOUTH DAILY   oxyCODONE 5 MG immediate release tablet Commonly known as:  Oxy IR/ROXICODONE Take 1 tablet (5 mg total) by mouth every 4 (four) hours as needed for breakthrough pain.   pravastatin 20 MG tablet Commonly known as:  PRAVACHOL Take 20 mg by mouth.   PROAIR HFA 108 (90 Base) MCG/ACT inhaler Generic drug:  albuterol USE 2 PUFFS TWICE DAILY AS NEEDED shortness of breath   raloxifene 60 MG tablet Commonly known as:  EVISTA TAKE ONE (1) TABLET BY MOUTH EVERY DAY   sennosides-docusate sodium 8.6-50 MG tablet Commonly known as:  SENOKOT-S Take 2 tablets by mouth 2 (two) times daily.   vitamin E 400 UNIT capsule Take 400 Units by mouth 3 (three) times daily.   zolpidem 5 MG tablet Commonly known as:  AMBIEN Take 5 mg by mouth at bedtime as needed for sleep.       Review of Systems  Constitutional: Negative for activity change, appetite change, chills, diaphoresis and fever.  HENT: Positive for ear discharge (cerumen impaction). Negative for congestion, mouth sores, nosebleeds, postnasal drip, sneezing, sore throat, trouble swallowing and voice change.   Respiratory: Negative for apnea, cough, choking, chest tightness, shortness of breath and wheezing.   Cardiovascular: Negative for chest pain, palpitations and leg swelling.  Gastrointestinal: Negative for abdominal distention, abdominal pain, constipation, diarrhea and nausea.  Genitourinary: Negative for difficulty urinating, dysuria, frequency and urgency.  Musculoskeletal: Positive for gait problem. Negative for back pain and myalgias. Arthralgias: typical arthritis.  Skin: Positive for wound. Negative for color change, pallor and rash.  Neurological: Positive  for weakness. Negative for dizziness, tremors, syncope, speech difficulty, numbness and headaches.  Psychiatric/Behavioral: Negative for agitation and behavioral problems.  All other systems reviewed and are negative.   Vitals:   07/26/17 0947  BP: (!) 118/51  Pulse: 66  Resp: 20  Temp: 98 F (36.7 C)  TempSrc: Oral  SpO2: 98%  Height: 5\' 5"  (1.651 m)   Body mass index is 28.79 kg/m. Physical Exam  Constitutional: She is oriented to person, place, and time. Vital signs are normal. She appears well-developed and well-nourished. She is active and cooperative. She does not appear ill. No distress.  HENT:  Head: Normocephalic and atraumatic.  Mouth/Throat: Uvula is midline, oropharynx is clear and moist and mucous membranes are normal. Mucous membranes are not pale, not dry and not cyanotic.  Eyes: Pupils are equal, round, and reactive to light. Conjunctivae, EOM and lids are normal.  Neck: Trachea normal, normal range of motion and full passive range of motion without pain. Neck supple. No JVD present. No tracheal deviation, no edema and no erythema present. No thyromegaly present.  Cardiovascular: Normal rate, normal heart sounds, intact distal pulses and normal pulses. An irregular rhythm present. Exam reveals no gallop, no distant heart sounds and no friction rub.  No murmur heard. Pulses:      Dorsalis pedis pulses are 2+ on the right side, and 2+ on the left side.  No edema  Pulmonary/Chest: Effort normal and breath sounds normal. No accessory muscle usage. No respiratory distress. She has no decreased breath sounds. She has no wheezes.  She has no rhonchi. She has no rales. She exhibits no tenderness.  Abdominal: Soft. Normal appearance and bowel sounds are normal. She exhibits no distension and no ascites. There is no tenderness.  Musculoskeletal: She exhibits no edema or tenderness.       Right hip: She exhibits decreased range of motion, decreased strength and laceration.    Expected osteoarthritis, stiffness; Bilateral Calves soft, supple. Negative Homan's Sign. B- pedal pulses equal  Neurological: She is alert and oriented to person, place, and time. She has normal strength.  Skin: Skin is warm and dry. Laceration noted. She is not diaphoretic. No cyanosis. No pallor. Nails show no clubbing.  Psychiatric: Her speech is normal and behavior is normal. Judgment and thought content normal. Her affect is blunt (irritable). Cognition and memory are normal.  Nursing note and vitals reviewed.   Labs reviewed: Basic Metabolic Panel: Recent Labs    07/15/17 0111 07/16/17 0418  NA 137 138  K 4.2 4.1  CL 103 107  CO2 22 24  GLUCOSE 132* 141*  BUN 21* 13  CREATININE 0.67 0.60  CALCIUM 8.8* 8.2*   Liver Function Tests: No results for input(s): AST, ALT, ALKPHOS, BILITOT, PROT, ALBUMIN in the last 8760 hours. No results for input(s): LIPASE, AMYLASE in the last 8760 hours. No results for input(s): AMMONIA in the last 8760 hours. CBC: Recent Labs    07/15/17 0111 07/16/17 0418  WBC 4.5 5.3  HGB 11.5* 10.6*  HCT 34.2* 30.9*  MCV 93.1 92.8  PLT 176 147*   Cardiac Enzymes: No results for input(s): CKTOTAL, CKMB, CKMBINDEX, TROPONINI in the last 8760 hours. BNP: Invalid input(s): POCBNP CBG: Recent Labs    07/17/17 0741 07/17/17 1214 07/17/17 1346  GLUCAP 96 179* 191*    Procedures and Imaging Studies During Stay: Dg Chest 1 View  Result Date: 07/15/2017 CLINICAL DATA:  Preoperative chest radiograph for right hip fracture. Initial encounter. EXAM: CHEST  1 VIEW COMPARISON:  None. FINDINGS: The lungs are well-aerated. Mild prominence of the right hilum is nonspecific but could reflect normal vasculature. There is no evidence of focal opacification, pleural effusion or pneumothorax. The cardiomediastinal silhouette is within normal limits. No acute osseous abnormalities are seen. Mild chronic-appearing right-sided rib deformities are noted. IMPRESSION:  No acute cardiopulmonary process seen. Mild prominence of the right hilum is nonspecific but could reflect normal vasculature. PA and lateral chest radiographs could be considered on an elective nonemergent basis for further evaluation. Electronically Signed   By: Roanna Raider M.D.   On: 07/15/2017 00:55   Dg Hip Operative Unilat W Or W/o Pelvis Right  Result Date: 07/15/2017 CLINICAL DATA:  Intraoperative fluoro spot images from right hip surgery. EXAM: OPERATIVE right HIP (WITH PELVIS IF PERFORMED) 3 VIEWS TECHNIQUE: Fluoroscopic spot image(s) were submitted for interpretation post-operatively. COMPARISON:  Preoperative study of July 15, 2017 FINDINGS: Three fluoro spot images are reviewed. Reported fluoro time is 34 seconds with a dose of 4.09 mGy. The patient has undergone ORIF for a fracture at the base of the neck. Radiographic positioning of the telescoping nail and intramedullary rod is good where visualized. The fracture has been reduced. IMPRESSION: Intraoperative three view fluoro spot spot series revealing ORIF for fracture involving the base of the right femoral neck. Electronically Signed   By: David  [place] M.D.   On: 07/15/2017 15:34   Dg Hip Unilat  With Pelvis 2-3 Views Right  Result Date: 07/15/2017 CLINICAL DATA:  Status post fall, with acute onset of  right hip pain. Initial encounter. EXAM: DG HIP (WITH OR WITHOUT PELVIS) 2-3V RIGHT COMPARISON:  None. FINDINGS: There is a displaced comminuted right femoral intertrochanteric fracture, with medial angulation of the distal femur. The right femoral head remains seated at the acetabulum. The left-sided dynamic hip screw is grossly unremarkable. The left hip joint is unremarkable. Mild degenerative change is noted at the lower lumbar spine at the sacroiliac joints. The visualized bowel gas pattern is grossly unremarkable. Soft tissue swelling is noted overlying the fracture site. IMPRESSION: Displaced comminuted right femoral intertrochanteric  fracture, with medial angulation of the distal femur. Electronically Signed   By: Roanna Raider M.D.   On: 07/15/2017 00:53    Assessment/Plan:    Closed fracture of right hip with routine healing, subsequent encounter  S/P ORIF (open reduction internal fixation) fracture  Bilateral impacted cerumen   Continue PT/OT  Continue exercises taught by PT/OT  Continue ice pack as needed to the right hip for pain and edema  Continue aspirin 325 mg tablet p.o. daily for DVT prophylaxis  Continue Debrox 6.5% otic solution- filled bilateral ear canals with drops twice daily, placing cotton ball in the ears behind drops.  Flushed ears with warm soapy water at completion of treatment for cerumen impaction  Continue Eliquis 5 mg p.o. every 12 hours for DVT prophylaxis  Continue oxycodone 5 mg 1 tablet p.o. every 4 hours as needed pain  Skin care per protocol  Follow-up with orthopedist as instructed  Patient is being discharged with the following home health services: Home health PT/OT through [facility]  Patient is being discharged with the following durable medical equipment: None needed  Patient has been advised to f/u with their PCP in 1-2 weeks to bring them up to date on their rehab stay.  Social services at facility was responsible for arranging this appointment.  Pt was provided with a 30 day supply of prescriptions for medications and refills must be obtained from their PCP.  For controlled substances, a more limited supply may be provided adequate until PCP appointment only.  Future labs/tests needed:    Family/ staff Communication:   Total Time:  Documentation:  Face to Face:  Family/Phone:  Brynda Rim, NP-C [facility] [address] [place] [place], [place] [address] Cell Phone (Mon-Fri 8am-5pm):  [phone] On Call:  [phone] & follow prompts after 5pm & weekends Office Phone:  [phone] Office Fax:   [phone]

## 2017-08-05 DIAGNOSIS — Z8781 Personal history of (healed) traumatic fracture: Secondary | ICD-10-CM

## 2017-08-05 DIAGNOSIS — Z9889 Other specified postprocedural states: Secondary | ICD-10-CM | POA: Insufficient documentation

## 2017-08-05 DIAGNOSIS — H6120 Impacted cerumen, unspecified ear: Secondary | ICD-10-CM | POA: Insufficient documentation

## 2018-03-12 ENCOUNTER — Emergency Department
Admission: EM | Admit: 2018-03-12 | Discharge: 2018-03-12 | Disposition: A | Discharge status: Home / Self Care | Payer: Medicare Other | Attending: Emergency Medicine | Admitting: Emergency Medicine

## 2018-03-12 ENCOUNTER — Emergency Department: Payer: Medicare Other

## 2018-03-12 ENCOUNTER — Encounter: Payer: Self-pay | Admitting: Emergency Medicine

## 2018-03-12 ENCOUNTER — Other Ambulatory Visit: Payer: Self-pay

## 2018-03-12 DIAGNOSIS — J45909 Unspecified asthma, uncomplicated: Secondary | ICD-10-CM | POA: Diagnosis not present

## 2018-03-12 DIAGNOSIS — Z91040 Latex allergy status: Secondary | ICD-10-CM | POA: Diagnosis not present

## 2018-03-12 DIAGNOSIS — Z96653 Presence of artificial knee joint, bilateral: Secondary | ICD-10-CM | POA: Insufficient documentation

## 2018-03-12 DIAGNOSIS — R600 Localized edema: Secondary | ICD-10-CM | POA: Insufficient documentation

## 2018-03-12 DIAGNOSIS — E039 Hypothyroidism, unspecified: Secondary | ICD-10-CM | POA: Insufficient documentation

## 2018-03-12 DIAGNOSIS — J449 Chronic obstructive pulmonary disease, unspecified: Secondary | ICD-10-CM | POA: Diagnosis not present

## 2018-03-12 DIAGNOSIS — E119 Type 2 diabetes mellitus without complications: Secondary | ICD-10-CM | POA: Diagnosis not present

## 2018-03-12 DIAGNOSIS — R609 Edema, unspecified: Secondary | ICD-10-CM

## 2018-03-12 DIAGNOSIS — R2243 Localized swelling, mass and lump, lower limb, bilateral: Secondary | ICD-10-CM | POA: Diagnosis present

## 2018-03-12 DIAGNOSIS — Z87891 Personal history of nicotine dependence: Secondary | ICD-10-CM | POA: Diagnosis not present

## 2018-03-12 LAB — CBC
HCT: 31.3 % — ABNORMAL LOW (ref 36.0–46.0)
Hemoglobin: 10.1 g/dL — ABNORMAL LOW (ref 12.0–15.0)
MCH: 30.1 pg (ref 26.0–34.0)
MCHC: 32.3 g/dL (ref 30.0–36.0)
MCV: 93.2 fL (ref 80.0–100.0)
PLATELETS: 174 10*3/uL (ref 150–400)
RBC: 3.36 MIL/uL — ABNORMAL LOW (ref 3.87–5.11)
RDW: 16 % — ABNORMAL HIGH (ref 11.5–15.5)
WBC: 4.8 10*3/uL (ref 4.0–10.5)
nRBC: 0 % (ref 0.0–0.2)

## 2018-03-12 LAB — BASIC METABOLIC PANEL
ANION GAP: 7 (ref 5–15)
BUN: 11 mg/dL (ref 8–23)
CO2: 26 mmol/L (ref 22–32)
Calcium: 8.6 mg/dL — ABNORMAL LOW (ref 8.9–10.3)
Chloride: 102 mmol/L (ref 98–111)
Creatinine, Ser: 0.53 mg/dL (ref 0.44–1.00)
GFR calc Af Amer: >60 mL/min (ref >60)
GFR calc non Af Amer: >60 mL/min (ref >60)
Glucose, Bld: 165 mg/dL — ABNORMAL HIGH (ref 70–99)
Potassium: 4.7 mmol/L (ref 3.5–5.1)
SODIUM: 135 mmol/L (ref 135–145)

## 2018-03-12 LAB — TROPONIN I

## 2018-03-12 LAB — BRAIN NATRIURETIC PEPTIDE: B Natriuretic Peptide: 364 pg/mL — ABNORMAL HIGH (ref 0.0–100.0)

## 2018-03-12 MED ORDER — SODIUM CHLORIDE 0.9% FLUSH: 3.0000 mL | Freq: Once | INTRAVENOUS | Status: DC

## 2018-03-12 NOTE — ED Provider Notes (Signed)
[facility] Emergency Department Provider Note       Time seen: ----------------------------------------- 1:48 PM on 03/12/2018 -----------------------------------------   I have reviewed the triage vital signs and the nursing notes.  HISTORY   Chief Complaint Leg Swelling    HPI Manaswini Fatzinger is a 73 y.o. female with a history of arrhythmia, asthma, A. fib, avascular necrosis, diabetes, HTN, hyperlipidemia who presents to the ED for leg swelling for the past 2 weeks.  Patient states she already takes 20 mg of Lasix daily.  She has never had swelling is severe.  She has also had some pain in her left shoulder and left breast.  Past Medical History:  Diagnosis Date  . Acquired hypothyroidism    Diagnosed with peripheral resistance  . Allergic rhinitis   . Aortic valve disorder   . Arrhythmia   . Arthritis   . Asthma   . Asthma without status asthmaticus    unspecified  . At risk for falls    uses cane, arthritis  . Atrial fibrillation (HCC)    Permanent at this time CHADSVASc=3 (age 36, DM, female) 05/30/09 - Afib at 107 4/25 A fib 88 with better rate control on Multaq, but dc Multaq since in permanent A Fib, refuses Coumadin therapy   . Avascular necrosis (HCC)    left foot  . Diabetes (HCC)   . Diabetic neuropathy (HCC)    unspecified  . Difficult intubation    has a difficult intubation card  . Fracture of distal femur (HCC)    left periprosthetic, status post ORIF  . GERD (gastroesophageal reflux disease)   . Gout   . Heart murmur   . History of atrial fibrillation   . History of chicken pox   . Humerus distal fracture    left - [place] on 05/21/12  . Hyperkeratosis    Pre-ulcerative hyperkeratotic lesions first MTPJs  . Hyperlipidemia   . Hypertension   . Hyperthyroidism   . Mycotic toenails   . Osteoarthritis   . Osteoporosis, post-menopausal    Status post bilateral knee replacement. DEXA 09/2009: t=-1.6 spine, t=-3.1 right fem  neck  . Paroxysmal tachycardia (HCC)    now in chronic A fib but rate is well controlled  . Type II diabetes mellitus [facility])     Patient Active Problem List   Diagnosis Date Noted  . S/P ORIF (open reduction internal fixation) fracture 08/05/2017  . Cerumen impaction 08/05/2017  . Humerus distal fracture   . Hip fracture (HCC) 07/15/2017  . Fracture of multiple pubic rami (HCC) 07/19/2016  . Leg pain 06/25/2016  . Varicose veins of lower extremities with ulcer (HCC) 03/26/2016  . Chronic venous insufficiency 03/26/2016  . Essential hypertension 03/26/2016  . Type 2 diabetes mellitus (HCC) 03/26/2016  . COPD (chronic obstructive pulmonary disease) (HCC) 03/26/2016    Past Surgical History:  Procedure Laterality Date  . CHOLECYSTECTOMY    . FEMUR FRACTURE SURGERY Left 2010  . INTRAMEDULLARY (IM) NAIL INTERTROCHANTERIC Right 07/15/2017   Procedure: INTRAMEDULLARY (IM) NAIL INTERTROCHANTRIC;  Surgeon: Kennedy Bucker, MD;  Location: [facility] ORS;  Service: Orthopedics;  Laterality: Right;  . JOINT REPLACEMENT  2007   knee replacement surgeries  . ORIF FIBULA FRACTURE    . ORIF HUMERUS FRACTURE Left 05/28/2012   supracondylar distal  . REPLACEMENT TOTAL KNEE BILATERAL    . THYROIDECTOMY  1975   [place]  . TOTAL ABDOMINAL HYSTERECTOMY W/ BILATERAL SALPINGOOPHORECTOMY  1983   [place]  . TOTAL KNEE ARTHROPLASTY  Bilateral     Allergies Neosporin [neomycin-polymyxin-gramicidin]; Tape; Codeine; Latex; Neomycin-bacitracin zn-polymyx; and Penicillins  Social History Social History   Tobacco Use  . Smoking status: Former Smoker    Types: Cigarettes    Last attempt to quit: 02/13/1979    Years since quitting: 39.1  . Smokeless tobacco: Never Used  Substance Use Topics  . Alcohol use: Yes    Alcohol/week: 14.0 standard drinks    Types: 14 Glasses of wine per week  . Drug use: No   Review of Systems Constitutional: Negative for fever. Cardiovascular: Negative for chest pain. Respiratory:  Negative for shortness of breath. Gastrointestinal: Negative for abdominal pain, vomiting and diarrhea. Musculoskeletal: Positive for left shoulder and arm pain, positive for peripheral edema Skin: Negative for rash. Neurological: Negative for headaches, focal weakness or numbness.  All systems negative/normal/unremarkable except as stated in the HPI  ____________________________________________   PHYSICAL EXAM:  VITAL SIGNS: ED Triage Vitals  Enc Vitals Group     BP 03/12/18 1054 (!) 149/59     Pulse Rate 03/12/18 1052 (!) 55     Resp 03/12/18 1052 16     Temp 03/12/18 1052 98.7 F (37.1 C)     Temp Source 03/12/18 1052 Oral     SpO2 03/12/18 1052 99 %     Weight --      Height --      Head Circumference --      Peak Flow --      Pain Score 03/12/18 1054 7     Pain Loc --      Pain Edu? --      Excl. in GC? --    Constitutional: Alert and oriented. Well appearing and in no distress. Eyes: Conjunctivae are normal. Normal extraocular movements. Cardiovascular: Normal rate, regular rhythm. No murmurs, rubs, or gallops. Respiratory: Normal respiratory effort without tachypnea nor retractions. Breath sounds are clear and equal bilaterally. No wheezes/rales/rhonchi. Gastrointestinal: Soft and nontender. Normal bowel sounds Musculoskeletal: Pain with range of motion of the left shoulder, bilateral pitting edema is noted in the lower extremities to mid tibia Neurologic:  Normal speech and language. No gross focal neurologic deficits are appreciated.  Skin:  Skin is warm, dry and intact. No rash noted. Psychiatric: Mood and affect are normal. Speech and behavior are normal.  ____________________________________________  EKG: Interpreted by me.  Atrial fibrillation with a slow response, rate is 50 bpm, possible septal infarct age-indeterminate, nonspecific ST and T wave segment changes  ____________________________________________  ED COURSE:  As part of my medical decision  making, I reviewed the following data within the electronic MEDICAL RECORD NUMBER History obtained from family if available, nursing notes, old chart and ekg, as well as notes from prior ED visits. Patient presented for peripheral edema, we will assess with labs and imaging as indicated at this time.   Procedures ____________________________________________   LABS (pertinent positives/negatives)  Labs Reviewed  BASIC METABOLIC PANEL - Abnormal; Notable for the following components:      Result Value   Glucose, Bld 165 (*)    Calcium 8.6 (*)    All other components within normal limits  CBC - Abnormal; Notable for the following components:   RBC 3.36 (*)    Hemoglobin 10.1 (*)    HCT 31.3 (*)    RDW 16.0 (*)    All other components within normal limits  BRAIN NATRIURETIC PEPTIDE - Abnormal; Notable for the following components:   B Natriuretic Peptide 364.0 (*)  All other components within normal limits  TROPONIN I    RADIOLOGY  Chest x-ray is unremarkable  ____________________________________________   DIFFERENTIAL DIAGNOSIS   Peripheral edema, renal failure, CHF, dehydration, electrolyte abnormality  FINAL ASSESSMENT AND PLAN  Peripheral edema   Plan: The patient had presented for peripheral edema. Patient's labs appear to be stable. Patient's imaging did not reveal any acute process.  We will double her Lasix.  I have discussed with her cardiologist who agrees with outpatient follow-up.   Ulice DashJohnathan E Verner Kopischke, MD    Note: This note was generated in part or whole with voice recognition software. Voice recognition is usually quite accurate but there are transcription errors that can and very often do occur. I apologize for any typographical errors that were not detected and corrected.     Emily FilbertWilliams, Jacquese Hackman E, MD 03/12/18 1352

## 2018-03-12 NOTE — ED Triage Notes (Signed)
PT c/o bilat lower extrem swelling x2wks. PT takes lasix daily. Pt also states she woke up with pain to her left breast/shoulder/arm area pain. NAD noted

## 2018-03-12 NOTE — ED Notes (Addendum)
NAD noted at time of D/C. Pt taken to lobby via wheelchair by [place], EDT. Pt denies comments/concerns regarding D/C at this time. No E-sig pad available, pt signed paper copy of D/C consent.

## 2018-03-12 NOTE — ED Notes (Signed)
Pt requesting a walker be brought in by her husband, this RN explained that we had walkers available for use. Pt noted to be irritated. Pt states "I am going to have to use the bathroom at some point", this RN explained that patient would be assisted to the bathroom when needed, however would request that patient not get up without assistance. Pt states "Oh well isnt that a novel idea". Pt with noted irritation at this time. Will continue to monitor for further patient needs.

## 2019-05-11 IMAGING — CR DG CHEST 1V
1 series · 1 of 1 positions shown · non-contrast
Comparison: None.

CLINICAL DATA: Preoperative chest radiograph for right hip
fracture. Initial encounter.

EXAM:
CHEST  1 VIEW

[dg chest 1 view]
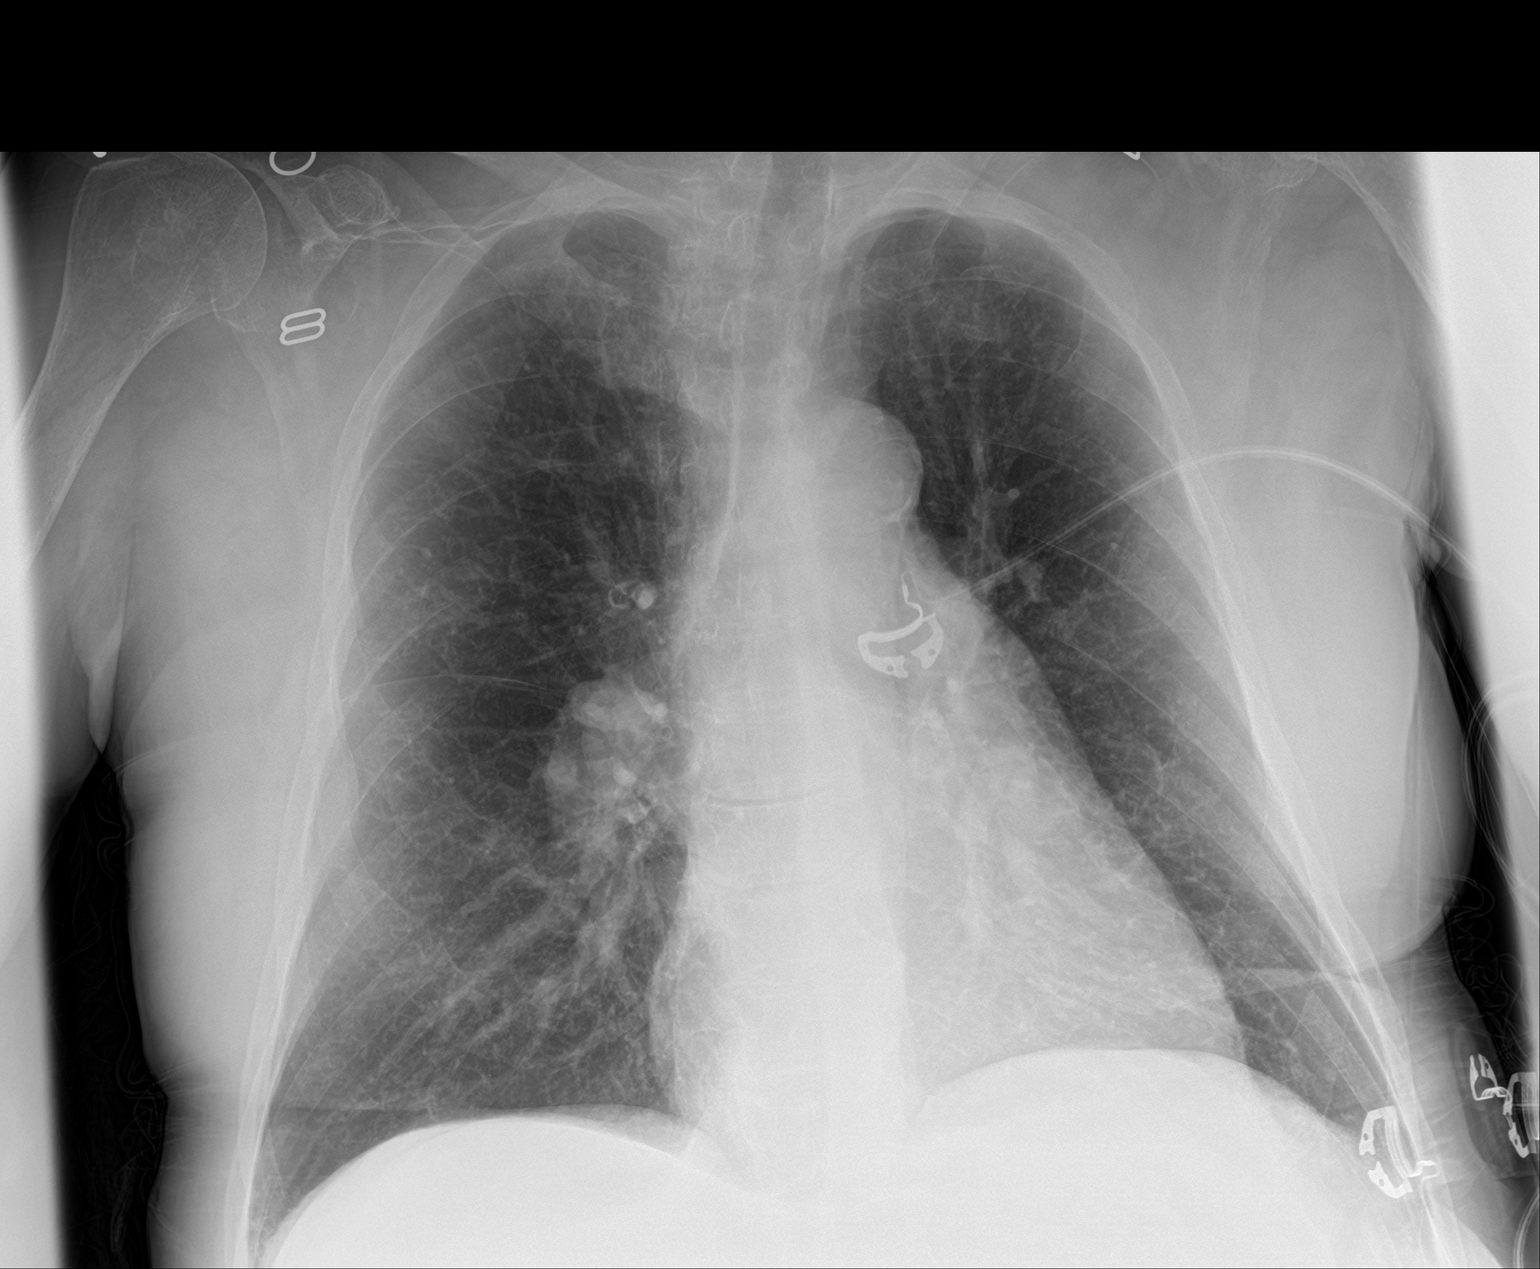

[1 of 1 positions shown; findings below may reference images not displayed]

FINDINGS: The lungs are well-aerated. Mild prominence of the right hilum is
nonspecific but could reflect normal vasculature. There is no
evidence of focal opacification, pleural effusion or pneumothorax.

The cardiomediastinal silhouette is within normal limits. No acute
osseous abnormalities are seen. Mild chronic-appearing right-sided
rib deformities are noted.
IMPRESSION: No acute cardiopulmonary process seen. Mild prominence of the right
hilum is nonspecific but could reflect normal vasculature. PA and
lateral chest radiographs could be considered on an elective
nonemergent basis for further evaluation.

## 2019-05-11 IMAGING — XA DG HIP (WITH PELVIS) OPERATIVE*R*
1 series · 3 of 3 positions shown · non-contrast
Comparison: Preoperative study July 15, 2017

CLINICAL DATA: Intraoperative fluoro spot images from right hip
surgery.

EXAM:
OPERATIVE right HIP (WITH PELVIS IF PERFORMED) 3 VIEWS
TECHNIQUE: Fluoroscopic spot image(s) were submitted for interpretation
post-operatively.

[Series 6001: m1 · 3 of 3 slices shown]
[im 1/3]
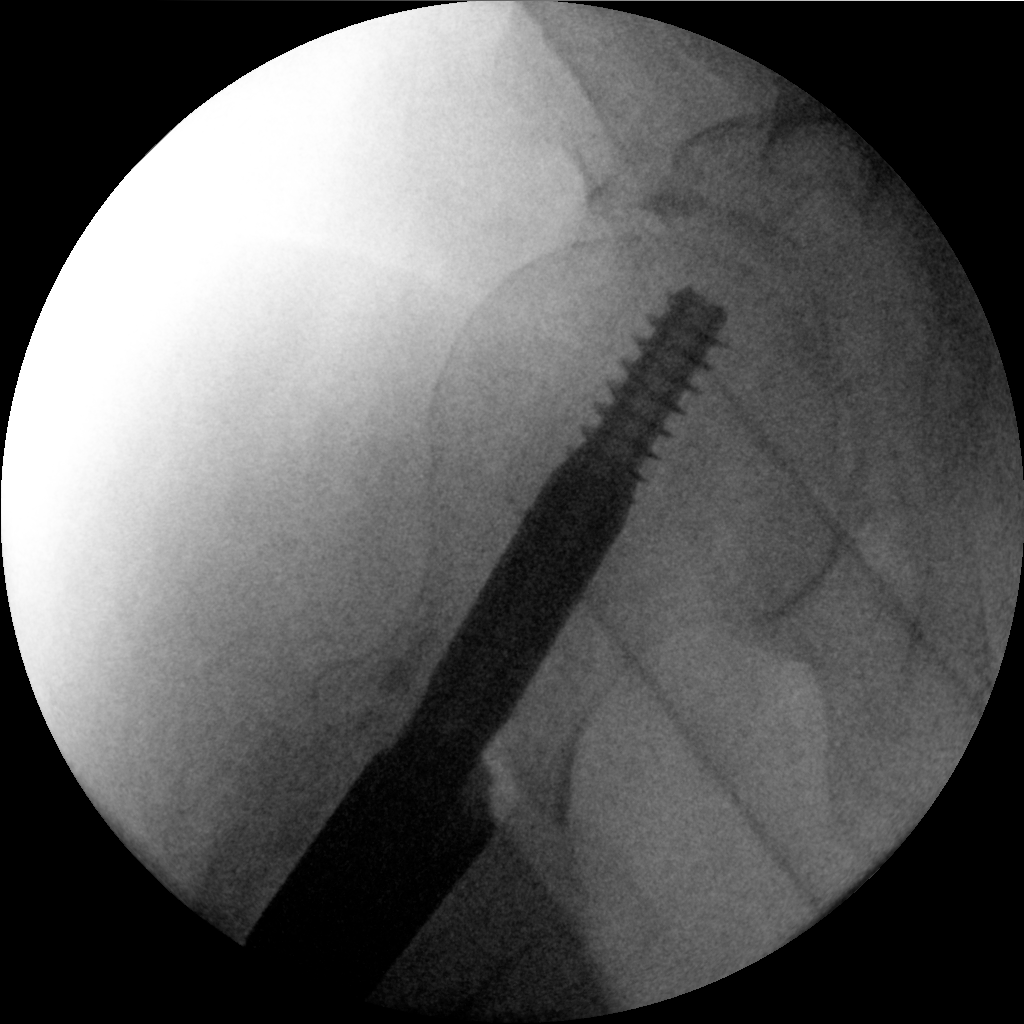
[im 2/3]
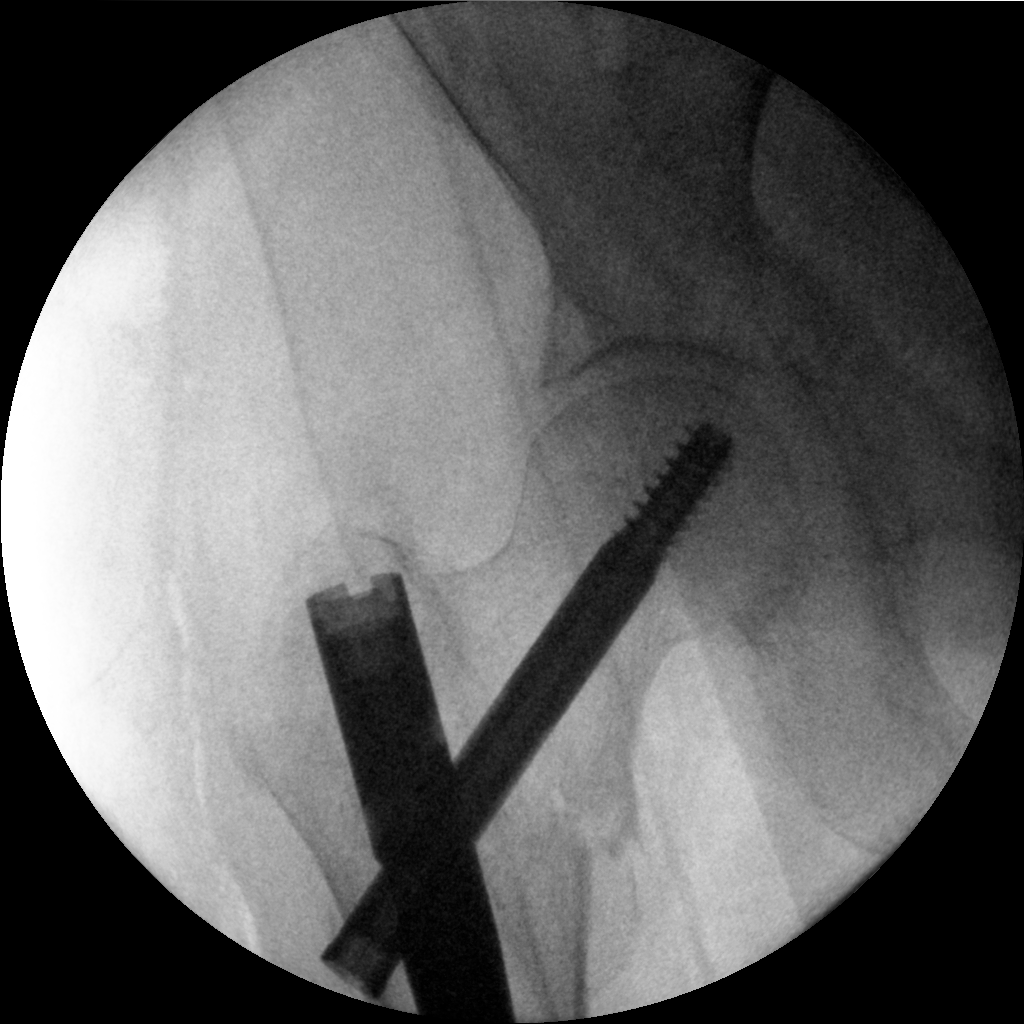
[im 3/3]
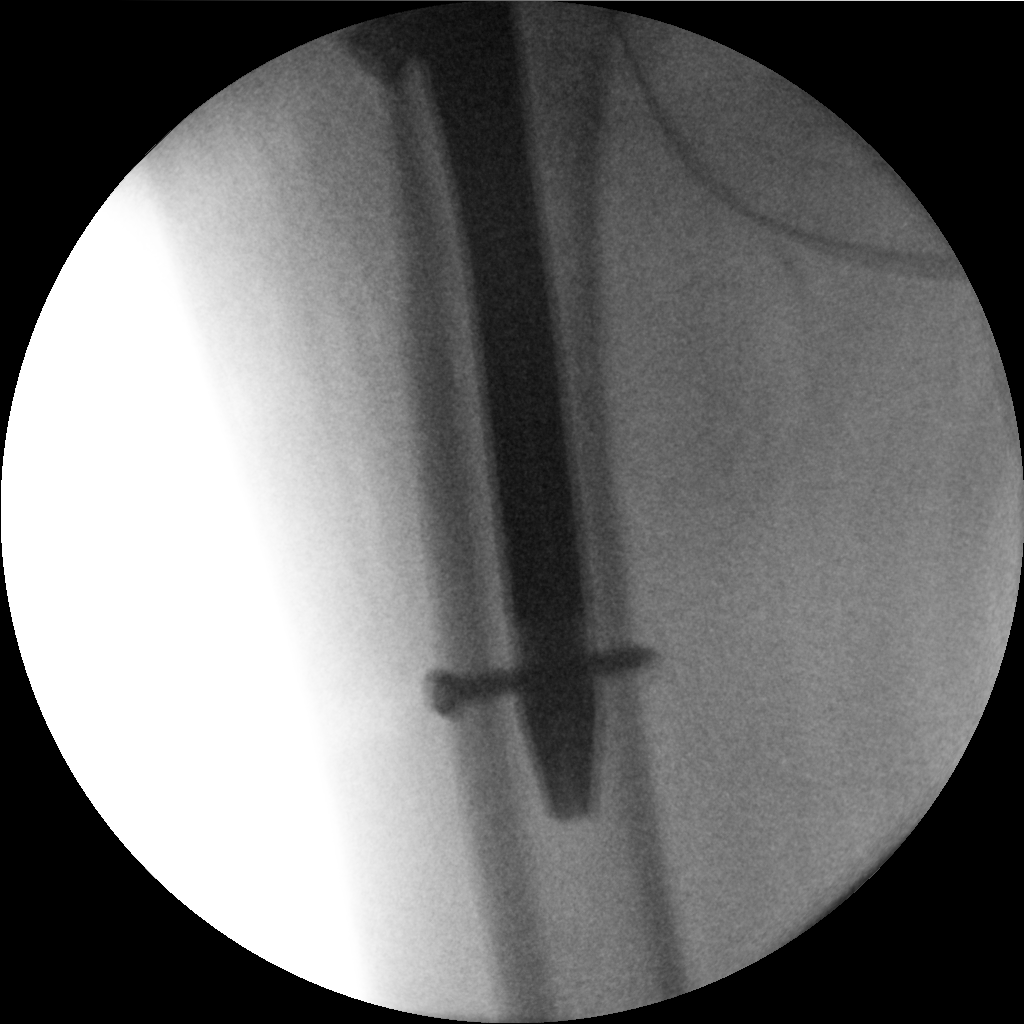

[3 of 3 positions shown; findings below may reference images not displayed]

FINDINGS: Three fluoro spot images are reviewed. Reported fluoro time is 34
seconds with a dose of 4.09 mGy. The patient has undergone ORIF for
a fracture at the base of the neck. Radiographic positioning of the
telescoping nail and intramedullary rod is good where visualized.
The fracture has been reduced.
IMPRESSION: Intraoperative three view fluoro spot spot series revealing ORIF for
fracture involving the base of the right femoral neck.

## 2019-08-23 ENCOUNTER — Emergency Department: Payer: Medicare Other

## 2019-08-23 ENCOUNTER — Inpatient Hospital Stay
Admission: EM | Admit: 2019-08-23 | Discharge: 2019-08-27 | DRG: 872 | Disposition: A | Discharge status: Home / Self Care | Payer: Medicare Other | Attending: Internal Medicine | Admitting: Internal Medicine

## 2019-08-23 ENCOUNTER — Inpatient Hospital Stay: Payer: Medicare Other

## 2019-08-23 ENCOUNTER — Other Ambulatory Visit: Payer: Self-pay

## 2019-08-23 DIAGNOSIS — K8050 Calculus of bile duct without cholangitis or cholecystitis without obstruction: Secondary | ICD-10-CM | POA: Diagnosis not present

## 2019-08-23 DIAGNOSIS — J449 Chronic obstructive pulmonary disease, unspecified: Secondary | ICD-10-CM | POA: Diagnosis present

## 2019-08-23 DIAGNOSIS — I4819 Other persistent atrial fibrillation: Secondary | ICD-10-CM | POA: Diagnosis not present

## 2019-08-23 DIAGNOSIS — I359 Nonrheumatic aortic valve disorder, unspecified: Secondary | ICD-10-CM | POA: Diagnosis present

## 2019-08-23 DIAGNOSIS — Z96653 Presence of artificial knee joint, bilateral: Secondary | ICD-10-CM | POA: Diagnosis present

## 2019-08-23 DIAGNOSIS — Z833 Family history of diabetes mellitus: Secondary | ICD-10-CM

## 2019-08-23 DIAGNOSIS — E890 Postprocedural hypothyroidism: Secondary | ICD-10-CM | POA: Diagnosis present

## 2019-08-23 DIAGNOSIS — I4821 Permanent atrial fibrillation: Secondary | ICD-10-CM | POA: Diagnosis present

## 2019-08-23 DIAGNOSIS — K8309 Other cholangitis: Secondary | ICD-10-CM

## 2019-08-23 DIAGNOSIS — M810 Age-related osteoporosis without current pathological fracture: Secondary | ICD-10-CM | POA: Diagnosis present

## 2019-08-23 DIAGNOSIS — Z885 Allergy status to narcotic agent status: Secondary | ICD-10-CM

## 2019-08-23 DIAGNOSIS — Z8249 Family history of ischemic heart disease and other diseases of the circulatory system: Secondary | ICD-10-CM

## 2019-08-23 DIAGNOSIS — E0590 Thyrotoxicosis, unspecified without thyrotoxic crisis or storm: Secondary | ICD-10-CM | POA: Diagnosis present

## 2019-08-23 DIAGNOSIS — Z79899 Other long term (current) drug therapy: Secondary | ICD-10-CM

## 2019-08-23 DIAGNOSIS — Z7901 Long term (current) use of anticoagulants: Secondary | ICD-10-CM

## 2019-08-23 DIAGNOSIS — I10 Essential (primary) hypertension: Secondary | ICD-10-CM | POA: Diagnosis present

## 2019-08-23 DIAGNOSIS — I4891 Unspecified atrial fibrillation: Secondary | ICD-10-CM | POA: Diagnosis not present

## 2019-08-23 DIAGNOSIS — K8030 Calculus of bile duct with cholangitis, unspecified, without obstruction: Secondary | ICD-10-CM | POA: Diagnosis present

## 2019-08-23 DIAGNOSIS — Z9049 Acquired absence of other specified parts of digestive tract: Secondary | ICD-10-CM | POA: Diagnosis not present

## 2019-08-23 DIAGNOSIS — Z79890 Hormone replacement therapy (postmenopausal): Secondary | ICD-10-CM

## 2019-08-23 DIAGNOSIS — R270 Ataxia, unspecified: Secondary | ICD-10-CM | POA: Diagnosis present

## 2019-08-23 DIAGNOSIS — K7200 Acute and subacute hepatic failure without coma: Secondary | ICD-10-CM | POA: Diagnosis not present

## 2019-08-23 DIAGNOSIS — R4182 Altered mental status, unspecified: Secondary | ICD-10-CM

## 2019-08-23 DIAGNOSIS — Z20822 Contact with and (suspected) exposure to covid-19: Secondary | ICD-10-CM | POA: Diagnosis present

## 2019-08-23 DIAGNOSIS — E872 Acidosis: Secondary | ICD-10-CM | POA: Diagnosis present

## 2019-08-23 DIAGNOSIS — M109 Gout, unspecified: Secondary | ICD-10-CM | POA: Diagnosis present

## 2019-08-23 DIAGNOSIS — Z8262 Family history of osteoporosis: Secondary | ICD-10-CM

## 2019-08-23 DIAGNOSIS — J309 Allergic rhinitis, unspecified: Secondary | ICD-10-CM | POA: Diagnosis present

## 2019-08-23 DIAGNOSIS — Z87891 Personal history of nicotine dependence: Secondary | ICD-10-CM

## 2019-08-23 DIAGNOSIS — R6520 Severe sepsis without septic shock: Secondary | ICD-10-CM | POA: Diagnosis present

## 2019-08-23 DIAGNOSIS — E876 Hypokalemia: Secondary | ICD-10-CM | POA: Diagnosis not present

## 2019-08-23 DIAGNOSIS — Z825 Family history of asthma and other chronic lower respiratory diseases: Secondary | ICD-10-CM

## 2019-08-23 DIAGNOSIS — K222 Esophageal obstruction: Secondary | ICD-10-CM | POA: Diagnosis present

## 2019-08-23 DIAGNOSIS — G9349 Other encephalopathy: Secondary | ICD-10-CM | POA: Diagnosis present

## 2019-08-23 DIAGNOSIS — E785 Hyperlipidemia, unspecified: Secondary | ICD-10-CM | POA: Diagnosis present

## 2019-08-23 DIAGNOSIS — Z888 Allergy status to other drugs, medicaments and biological substances status: Secondary | ICD-10-CM

## 2019-08-23 DIAGNOSIS — K219 Gastro-esophageal reflux disease without esophagitis: Secondary | ICD-10-CM | POA: Diagnosis present

## 2019-08-23 DIAGNOSIS — E1140 Type 2 diabetes mellitus with diabetic neuropathy, unspecified: Secondary | ICD-10-CM | POA: Diagnosis present

## 2019-08-23 DIAGNOSIS — Z91040 Latex allergy status: Secondary | ICD-10-CM

## 2019-08-23 DIAGNOSIS — Z880 Allergy status to penicillin: Secondary | ICD-10-CM

## 2019-08-23 DIAGNOSIS — Z7984 Long term (current) use of oral hypoglycemic drugs: Secondary | ICD-10-CM

## 2019-08-23 DIAGNOSIS — A419 Sepsis, unspecified organism: Secondary | ICD-10-CM | POA: Diagnosis present

## 2019-08-23 DIAGNOSIS — Z91048 Other nonmedicinal substance allergy status: Secondary | ICD-10-CM

## 2019-08-23 DIAGNOSIS — Z7982 Long term (current) use of aspirin: Secondary | ICD-10-CM

## 2019-08-23 DIAGNOSIS — R109 Unspecified abdominal pain: Secondary | ICD-10-CM

## 2019-08-23 LAB — CBC WITH DIFFERENTIAL/PLATELET
Abs Immature Granulocytes: 0.07 10*3/uL (ref 0.00–0.07)
Basophils Absolute: 0.1 10*3/uL (ref 0.0–0.1)
Basophils Relative: 0 %
Eosinophils Absolute: 0 10*3/uL (ref 0.0–0.5)
Eosinophils Relative: 0 %
HCT: 33.9 % — ABNORMAL LOW (ref 36.0–46.0)
Hemoglobin: 11.6 g/dL — ABNORMAL LOW (ref 12.0–15.0)
Immature Granulocytes: 0 %
Lymphocytes Relative: 4 %
Lymphs Abs: 0.6 10*3/uL — ABNORMAL LOW (ref 0.7–4.0)
MCH: 29.9 pg (ref 26.0–34.0)
MCHC: 34.2 g/dL (ref 30.0–36.0)
MCV: 87.4 fL (ref 80.0–100.0)
Monocytes Absolute: 0.9 10*3/uL (ref 0.1–1.0)
Monocytes Relative: 5 %
Neutro Abs: 15.2 10*3/uL — ABNORMAL HIGH (ref 1.7–7.7)
Neutrophils Relative %: 91 %
Platelets: 358 10*3/uL (ref 150–400)
RBC: 3.88 MIL/uL (ref 3.87–5.11)
RDW: 16 % — ABNORMAL HIGH (ref 11.5–15.5)
WBC: 16.9 10*3/uL — ABNORMAL HIGH (ref 4.0–10.5)
nRBC: 0 % (ref 0.0–0.2)

## 2019-08-23 LAB — COMPREHENSIVE METABOLIC PANEL
ALT: 37 U/L (ref 0–44)
AST: 72 U/L — ABNORMAL HIGH (ref 15–41)
Albumin: 3.2 g/dL — ABNORMAL LOW (ref 3.5–5.0)
Alkaline Phosphatase: 480 U/L — ABNORMAL HIGH (ref 38–126)
Anion gap: 13 (ref 5–15)
BUN: 16 mg/dL (ref 8–23)
CO2: 22 mmol/L (ref 22–32)
Calcium: 8.4 mg/dL — ABNORMAL LOW (ref 8.9–10.3)
Chloride: 98 mmol/L (ref 98–111)
Creatinine, Ser: 0.75 mg/dL (ref 0.44–1.00)
GFR calc Af Amer: >60 mL/min (ref >60)
GFR calc non Af Amer: >60 mL/min (ref >60)
Glucose, Bld: 205 mg/dL — ABNORMAL HIGH (ref 70–99)
Potassium: 3.8 mmol/L (ref 3.5–5.1)
Sodium: 133 mmol/L — ABNORMAL LOW (ref 135–145)
Total Bilirubin: 4.1 mg/dL — ABNORMAL HIGH (ref 0.3–1.2)
Total Protein: 8.3 g/dL — ABNORMAL HIGH (ref 6.5–8.1)

## 2019-08-23 LAB — PROTIME-INR
INR: 1.2 (ref 0.8–1.2)
Prothrombin Time: 14.7 seconds (ref 11.4–15.2)

## 2019-08-23 LAB — URINALYSIS, COMPLETE (UACMP) WITH MICROSCOPIC
Bilirubin Urine: NEGATIVE
Glucose, UA: NEGATIVE mg/dL
Hgb urine dipstick: NEGATIVE
Ketones, ur: NEGATIVE mg/dL
Nitrite: NEGATIVE
Protein, ur: NEGATIVE mg/dL
Specific Gravity, Urine: 1.01 (ref 1.005–1.030)
pH: 5 (ref 5.0–8.0)

## 2019-08-23 LAB — TROPONIN I (HIGH SENSITIVITY)
Troponin I (High Sensitivity): 26 ng/L — ABNORMAL HIGH (ref <18)
Troponin I (High Sensitivity): 28 ng/L — ABNORMAL HIGH (ref <18)

## 2019-08-23 LAB — LIPASE, BLOOD: Lipase: 19 U/L (ref 11–51)

## 2019-08-23 LAB — DIGOXIN LEVEL: Digoxin Level: 1.2 ng/mL (ref 0.8–2.0)

## 2019-08-23 LAB — GLUCOSE, CAPILLARY: Glucose-Capillary: 201 mg/dL — ABNORMAL HIGH (ref 70–99)

## 2019-08-23 LAB — PHOSPHORUS: Phosphorus: 2.7 mg/dL (ref 2.5–4.6)

## 2019-08-23 LAB — APTT: aPTT: 33 seconds (ref 24–36)

## 2019-08-23 LAB — MAGNESIUM: Magnesium: 0.7 mg/dL — CL (ref 1.7–2.4)

## 2019-08-23 LAB — LACTIC ACID, PLASMA
Lactic Acid, Venous: 2.4 mmol/L (ref 0.5–1.9)
Lactic Acid, Venous: 1.1 mmol/L (ref 0.5–1.9)

## 2019-08-23 LAB — PROCALCITONIN: Procalcitonin: 1.39 ng/mL

## 2019-08-23 LAB — SARS CORONAVIRUS 2 BY RT PCR (HOSPITAL ORDER, PERFORMED IN ~~LOC~~ HOSPITAL LAB): SARS Coronavirus 2: NEGATIVE

## 2019-08-23 MED ORDER — MAGNESIUM SULFATE 2 GM/50ML IV SOLN
2.0000 g | Freq: Once | INTRAVENOUS | Status: DC
  Filled 2019-08-23: qty 50

## 2019-08-23 MED ORDER — INSULIN ASPART 100 UNIT/ML ~~LOC~~ SOLN
0.0000 [IU] | SUBCUTANEOUS | Status: DC
  Administered 2019-08-24: 2 [IU] via SUBCUTANEOUS
  Administered 2019-08-24 (×2): 3 [IU] via SUBCUTANEOUS
  Administered 2019-08-25 (×2): 2 [IU] via SUBCUTANEOUS
  Administered 2019-08-26: 1 [IU] via SUBCUTANEOUS
  Administered 2019-08-26 (×2): 2 [IU] via SUBCUTANEOUS
  Filled 2019-08-23 (×9): qty 1

## 2019-08-23 MED ORDER — MAGNESIUM SULFATE 4 GM/100ML IV SOLN
4.0000 g | Freq: Once | INTRAVENOUS | Status: AC
  Administered 2019-08-24: 4 g via INTRAVENOUS
  Filled 2019-08-23: qty 100

## 2019-08-23 MED ORDER — SODIUM CHLORIDE 0.9 % IV SOLN
INTRAVENOUS | Status: DC
  Administered 2019-08-26: 75 mL/h via INTRAVENOUS

## 2019-08-23 MED ORDER — FAMOTIDINE IN NACL 20-0.9 MG/50ML-% IV SOLN
20.0000 mg | Freq: Every day | INTRAVENOUS | Status: DC
  Administered 2019-08-24 (×2): 20 mg via INTRAVENOUS
  Filled 2019-08-23 (×2): qty 50

## 2019-08-23 MED ORDER — SODIUM CHLORIDE 0.9 % IV BOLUS
1000.0000 mL | Freq: Once | INTRAVENOUS | Status: AC
  Administered 2019-08-23: 1000 mL via INTRAVENOUS

## 2019-08-23 MED ORDER — ACETAMINOPHEN 325 MG PO TABS
650.0000 mg | ORAL_TABLET | Freq: Once | ORAL | Status: AC
  Administered 2019-08-23: 650 mg via ORAL
  Filled 2019-08-23: qty 2

## 2019-08-23 MED ORDER — SODIUM CHLORIDE 0.9 % IV BOLUS: 500.0000 mL | Freq: Once | INTRAVENOUS | Status: DC

## 2019-08-23 MED ORDER — METRONIDAZOLE IN NACL 5-0.79 MG/ML-% IV SOLN
500.0000 mg | Freq: Three times a day (TID) | INTRAVENOUS | Status: DC
  Administered 2019-08-24: 500 mg via INTRAVENOUS
  Filled 2019-08-23 (×3): qty 100

## 2019-08-23 MED ORDER — POLYETHYLENE GLYCOL 3350 17 G PO PACK: 17.0000 g | PACK | Freq: Every day | ORAL | Status: DC | PRN

## 2019-08-23 MED ORDER — ALBUTEROL SULFATE (2.5 MG/3ML) 0.083% IN NEBU: 3.0000 mL | INHALATION_SOLUTION | Freq: Four times a day (QID) | RESPIRATORY_TRACT | Status: DC | PRN

## 2019-08-23 MED ORDER — VANCOMYCIN HCL IN DEXTROSE 1-5 GM/200ML-% IV SOLN
1000.0000 mg | Freq: Once | INTRAVENOUS | Status: AC
  Administered 2019-08-23: 1000 mg via INTRAVENOUS
  Filled 2019-08-23: qty 200

## 2019-08-23 MED ORDER — GADOBUTROL 1 MMOL/ML IV SOLN
7.5000 mL | Freq: Once | INTRAVENOUS | Status: AC | PRN
  Administered 2019-08-23: 7.5 mL via INTRAVENOUS

## 2019-08-23 MED ORDER — DOCUSATE SODIUM 100 MG PO CAPS: 100.0000 mg | ORAL_CAPSULE | Freq: Two times a day (BID) | ORAL | Status: DC | PRN

## 2019-08-23 MED ORDER — CARBAMIDE PEROXIDE 6.5 % OT SOLN
1.0000 [drp] | Freq: Two times a day (BID) | OTIC | Status: DC
  Filled 2019-08-23: qty 15

## 2019-08-23 MED ORDER — MORPHINE SULFATE (PF) 2 MG/ML IV SOLN
1.0000 mg | INTRAVENOUS | Status: DC | PRN
  Administered 2019-08-23 – 2019-08-24 (×3): 2 mg via INTRAVENOUS
  Filled 2019-08-23 (×3): qty 1

## 2019-08-23 MED ORDER — ONDANSETRON HCL 4 MG/2ML IJ SOLN
4.0000 mg | Freq: Four times a day (QID) | INTRAMUSCULAR | Status: DC | PRN
  Administered 2019-08-23 – 2019-08-24 (×2): 4 mg via INTRAVENOUS
  Filled 2019-08-23 (×2): qty 2

## 2019-08-23 MED ORDER — SODIUM CHLORIDE 0.9 % IV SOLN
2.0000 g | Freq: Once | INTRAVENOUS | Status: AC
  Administered 2019-08-23: 2 g via INTRAVENOUS
  Filled 2019-08-23: qty 2

## 2019-08-23 MED ORDER — METRONIDAZOLE IN NACL 5-0.79 MG/ML-% IV SOLN
500.0000 mg | Freq: Once | INTRAVENOUS | Status: AC
  Administered 2019-08-23: 500 mg via INTRAVENOUS
  Filled 2019-08-23: qty 100

## 2019-08-23 MED ORDER — SODIUM CHLORIDE 0.9 % IV BOLUS
500.0000 mL | Freq: Once | INTRAVENOUS | Status: AC
  Administered 2019-08-24: 500 mL via INTRAVENOUS

## 2019-08-23 MED ORDER — MAGNESIUM SULFATE 2 GM/50ML IV SOLN
2.0000 g | Freq: Once | INTRAVENOUS | Status: AC
  Administered 2019-08-23: 2 g via INTRAVENOUS

## 2019-08-23 NOTE — ED Notes (Signed)
Pt assisted to bedpan by this RN, pt cleaned and dried after urinating. Fresh linens placed underneath pt. Pt repositioned and comfortable, husband bedside.

## 2019-08-23 NOTE — Progress Notes (Signed)
PHARMACY -  BRIEF ANTIBIOTIC NOTE   Pharmacy has received consult(s) for Vancomycin and Aztreonam from an ED provider.  The patient's profile has been reviewed for ht/wt/allergies/indication/available labs.    One time order(s) placed for Vancomycin 1g IV and Aztreonam 2g IV x 1 dose each.  Further antibiotics/pharmacy consults should be ordered by admitting physician if indicated.                       Thank you, Bettey Costa 08/23/2019  6:21 PM

## 2019-08-23 NOTE — ED Notes (Addendum)
Pt returned from MRI °

## 2019-08-23 NOTE — ED Triage Notes (Signed)
Per EMS, pt was brought from home for "either altered mental status or psych." Husband told ems that she "gets like this after her pain medicine sometimes." BGL 202 per EMS. EMS report that pt was "slightly combative' and trying to get up out of stretcher.  Pt is AOX4, hot to touch, NAD.

## 2019-08-23 NOTE — ED Notes (Signed)
Pt transported to CT ?

## 2019-08-23 NOTE — H&P (Signed)
Name: Bethany Joseph MRN: [id number] DOB: February 11, 1946    ADMISSION DATE:  08/23/2019 CONSULTATION DATE: 08/23/2019  REFERRING MD : Dr. Kerman Passey   CHIEF COMPLAINT: AMS   BRIEF PATIENT DESCRIPTION:  74 yo female admitted with sepsis possibly secondary to cholangitis   SIGNIFICANT EVENTS/STUDIES:  07/11: [place] Abd Limited revealed status post cholecystectomy. There is significant intrahepatic and extrahepatic biliary ductal dilatation raising concern for an underlying obstructing process. Follow-up with MRCP/ERCP is recommended. 07/11: CT Head/Cervical Spine revealed no acute intracranial process. No acute cervical spine fracture 07/11: Due to abnormal [place] Abd findings MRCP ordered and GI consulted plan to evaluate pt tomorrow 07/12 07/11: PCCM team contacted for ICU admission   HISTORY OF PRESENT ILLNESS:   This is a 75 yo female with a PMH of Type II Diabetes Mellitus, Paroxysmal Tachycardia, Chronic Atrial Fibrillation, COPD, Osteoporosis, Hyperthyroidism, HLD, HTN, Hyperkeratosis, Heart Murmur, Gout, GERD, Difficult Intubation, Diabetic Neuropathy, Avascular Necrosis of Right Foot, Falls, Asthma, Arthritis, Aortic Valve Disorder, Allergic Rhinitis, and Acquired Hypothyroidism.  She presented to [facility] ER on 07/11 with AMS, nausea, an episode of vomiting, and abdominal pain.  Per ER notes pts husband reported the pt "was not acting like herself" the afternoon of 07/11 prompting EMS notification. Upon EMS arrival at pts home pt slightly combative.  In the ER pts vital signs were temp 103 F, bp 131/73, hr 86 bpm, and O2 sats 92% on RA.  Lab results revealed Na+ 133, glucose 205, calcium 8.4, alk phos 480, albumin 3.2, AST 72, total bilirubin 4.1, troponin 26, lactic acid 2.4, wbc 16.9, hgb 11.6, and COVID-19 negative.  [place] Abd limited revealed significant intrahepatic and extrahepatic biliary ductal dilatation raising concern for an underlying obstructing process recommending MRCP/ERCP.  ER physician  contacted [facility] on call gastroenterologist Dr. Vicente Males regarding abnormal US findings per GI physician plan for on call gastroenterologist Dr. Allen Norris to evaluate pt on 08/24/19.  Therefore, ER physician contacted on call gastroenterology at [facility] to determine if they would be able to perform an ERCP tonight if needed, however they stated it would not be performed until 08/24/19 if the pt were transferred to [facility].  Pts ER vital signs stable and MRCP order per ER physician.  [facility] PCCM team contacted for ICU admission.    PAST MEDICAL HISTORY :   has a past medical history of Acquired hypothyroidism, Allergic rhinitis, Aortic valve disorder, Arrhythmia, Arthritis, Asthma, Asthma without status asthmaticus, At risk for falls, Atrial fibrillation ([place]), Avascular necrosis ([place]), Diabetes ([place]), Diabetic neuropathy ([place]), Difficult intubation, Fracture of distal femur ([place]), GERD (gastroesophageal reflux disease), Gout, Heart murmur, History of atrial fibrillation, History of chicken pox, Humerus distal fracture, Hyperkeratosis, Hyperlipidemia, Hypertension, Hyperthyroidism, Mycotic toenails, Osteoarthritis, Osteoporosis, post-menopausal, Paroxysmal tachycardia ([place]), and Type II diabetes mellitus ([place]).  has a past surgical history that includes Replacement total knee bilateral; ORIF humerus fracture (Left, 05/28/2012); ORIF fibula fracture; Cholecystectomy; Intramedullary (im) nail intertrochanteric (Right, 07/15/2017); Thyroidectomy (1975); Total abdominal hysterectomy w/ bilateral salpingoophorectomy (1983); Joint replacement (2007); Total knee arthroplasty (Bilateral); and Femur fracture surgery (Left, 2010). Prior to Admission medications   Medication Sig Start Date End Date Taking? Authorizing Provider  acetaminophen (TYLENOL) 325 MG tablet Take 650 mg by mouth every 4 (four) hours as needed.    [provider]  albuterol (PROAIR HFA) 108 (90 Base) MCG/ACT inhaler USE 2 PUFFS TWICE  DAILY AS NEEDED shortness of breath 04/02/14   [provider]  aspirin 325 MG tablet Take 325 mg  by mouth daily.    [provider]  bisacodyl (DULCOLAX) 10 MG suppository Place 10 mg rectally as needed for moderate constipation.    [provider]  carbamide peroxide (DEBROX) 6.5 % OTIC solution Place 1 drop into both ears 2 (two) times daily.    [provider]  Cholecalciferol 4000 units CAPS Take 1 capsule by mouth daily.    [provider]  digoxin (LANOXIN) 0.125 MG tablet Take 0.125 mg by mouth daily.    [provider]  ELIQUIS 5 MG TABS tablet Take 5 mg by mouth every 12 (twelve) hours. 06/24/17   [provider]  furosemide (LASIX) 20 MG tablet Take 20 mg by mouth daily.     [provider]  gabapentin (NEURONTIN) 400 MG capsule Take 400 mg by mouth at bedtime.    [provider]  levothyroxine (SYNTHROID, LEVOTHROID) 25 MCG tablet Take 25 mcg by mouth daily before breakfast.    [provider]  liothyronine (CYTOMEL) 25 MCG tablet Take 100 mcg by mouth daily. 4 tabs    [provider]  loratadine (CLARITIN) 10 MG tablet Take 10 mg by mouth daily.    [provider]  metFORMIN (GLUCOPHAGE-XR) 500 MG 24 hr tablet Take 1,000 mg by mouth every evening.    [provider]  nebivolol (BYSTOLIC) 2.5 MG tablet Take 2.5 mg by mouth daily as needed (hypertension).    [provider]  Omega 3 1200 MG CAPS Take 1,200 mg by mouth daily.    [provider]  omeprazole (PRILOSEC) 40 MG capsule TAKE ONE CAPSULE BY MOUTH DAILY 06/13/15   [provider]  oxyCODONE (OXY IR/ROXICODONE) 5 MG immediate release tablet Take 1 tablet (5 mg total) by mouth every 4 (four) hours as needed for breakthrough pain. 07/23/17   Toni Arthurs, NP  pravastatin (PRAVACHOL) 20 MG tablet Take 20 mg by mouth. 10/24/15   [provider]  raloxifene (EVISTA) 60 MG tablet TAKE ONE  (1) TABLET BY MOUTH EVERY DAY 02/22/16   [provider]  sennosides-docusate sodium (SENOKOT-S) 8.6-50 MG tablet Take 2 tablets by mouth 2 (two) times daily.    [provider]  vitamin E 400 UNIT capsule Take 400 Units by mouth 3 (three) times daily.     [provider]  zolpidem (AMBIEN) 5 MG tablet Take 5 mg by mouth at bedtime as needed for sleep.    [provider]   Allergies  Allergen Reactions  . Neosporin [Neomycin-Polymyxin-Gramicidin] Hives  . Tape     Other reaction(s): Unknown Adhesive bandage  . Codeine Rash  . Latex Rash  . Neomycin-Bacitracin Zn-Polymyx Rash    Other reaction(s): Unknown DERMATOLOGICALS   . Penicillins Rash and Other (See Comments)    Has patient had a PCN reaction causing immediate rash, facial/tongue/throat swelling, SOB or lightheadedness with hypotension: Unknown Has patient had a PCN reaction causing severe rash involving mucus membranes or skin necrosis: Unknown Has patient had a PCN reaction that required hospitalization: Unknown Has patient had a PCN reaction occurring within the last 10 years: No If all of the above answers are "NO", then may proceed with Cephalosporin use.     FAMILY HISTORY:  family history includes Arthritis in her mother; Asthma in her maternal aunt and maternal grandfather; COPD in her maternal aunt and maternal grandfather; Cancer in her maternal aunt; Colon cancer in her mother; Coronary artery disease in her father; Depression in her brother; Diabetes in her  brother and father; Early death in her father and mother; Heart attack in her father; Heart disease in her father; Hyperlipidemia in her father; Hypertension in her father; Mental illness in her brother; Osteoporosis in her mother. SOCIAL HISTORY:  reports that she quit smoking about 40 years ago. Her smoking use included cigarettes. She has never used smokeless tobacco. She reports current alcohol use of about 14.0 standard drinks  of alcohol per week. She reports that she does not use drugs.  REVIEW OF SYSTEMS: Positives in BOLD  Constitutional: confusion, fever, chills, weight loss, malaise/fatigue and diaphoresis.  HENT: Negative for hearing loss, ear pain, nosebleeds, congestion, sore throat, neck pain, tinnitus and ear discharge.   Eyes: Negative for blurred vision, double vision, photophobia, pain, discharge and redness.  Respiratory: Negative for cough, hemoptysis, sputum production, shortness of breath, wheezing and stridor.   Cardiovascular: Negative for chest pain, palpitations, orthopnea, claudication, leg swelling and PND.  Gastrointestinal: heartburn, nausea, vomiting, abdominal pain, diarrhea, constipation, blood in stool and melena.  Genitourinary: Negative for dysuria, urgency, frequency, hematuria and flank pain.  Musculoskeletal: Negative for myalgias, back pain, joint pain and falls.  Skin: Negative for itching and rash.  Neurological: Negative for dizziness, tingling, tremors, sensory change, speech change, focal weakness, seizures, loss of consciousness, weakness and headaches.  Endo/Heme/Allergies: Negative for environmental allergies and polydipsia. Does not bruise/bleed easily.  SUBJECTIVE:  No complaints at this time asking for something to drink   VITAL SIGNS: Temp:  [103 F (39.4 C)] 103 F (39.4 C) (07/11 1750) Pulse Rate:  [66-86] 66 (07/11 2100) Resp:  [19-28] 19 (07/11 2100) BP: (92-131)/(47-73) 121/59 (07/11 2100) SpO2:  [92 %-96 %] 93 % (07/11 2100) Weight:  [90.7 kg] 90.7 kg (07/11 1751)  PHYSICAL EXAMINATION: General: acutely ill appearing female, NAD resting in bed  Neuro: alert, able to follow commands, intermittently confused, PERRLA  HEENT: supple, no JVD  Cardiovascular: irregular irregular, no R/G  Lungs: clear throughout, even, non labored  Abdomen: +BS x4, soft, non tender, slightly distended  Musculoskeletal: normal tone, 2+ bilateral lower extremity edema  Skin:  no rashes or lesions present   Recent Labs  Lab 08/23/19 1746  NA 133*  K 3.8  CL 98  CO2 22  BUN 16  CREATININE 0.75  GLUCOSE 205*   Recent Labs  Lab 08/23/19 1746  HGB 11.6*  HCT 33.9*  WBC 16.9*  PLT 358   CT Head Wo Contrast  Result Date: 08/23/2019 CLINICAL DATA:  Altered level of consciousness, combative, frequent falls EXAM: CT HEAD WITHOUT CONTRAST CT CERVICAL SPINE WITHOUT CONTRAST TECHNIQUE: Multidetector CT imaging of the head and cervical spine was performed following the standard protocol without intravenous contrast. Multiplanar CT image reconstructions of the cervical spine were also generated. COMPARISON:  11/01/2009 FINDINGS: CT HEAD FINDINGS Brain: No acute infarct or hemorrhage. Lateral ventricles and midline structures are unremarkable. There is moderate diffuse cerebral atrophy. No acute extra-axial fluid collections. No mass effect. Vascular: No hyperdense vessel or unexpected calcification. Skull: Normal. Negative for fracture or focal lesion. Sinuses/Orbits: No acute finding. Other: None. CT CERVICAL SPINE FINDINGS Alignment: Alignment is anatomic. Skull base and vertebrae: No acute displaced fractures. Soft tissues and spinal canal: No prevertebral fluid or swelling. No visible canal hematoma. Disc levels: Mild mid cervical facet hypertrophy. No significant spondylosis or compressive sequela. Upper chest: Airway is patent. Lung apices are clear. Other: Reconstructed images demonstrate no additional findings. IMPRESSION: 1. No acute intracranial process. 2. No acute cervical spine fracture.  Electronically Signed   By: Randa Ngo M.D.   On: 08/23/2019 18:35   CT Cervical Spine Wo Contrast  Result Date: 08/23/2019 CLINICAL DATA:  Altered level of consciousness, combative, frequent falls EXAM: CT HEAD WITHOUT CONTRAST CT CERVICAL SPINE WITHOUT CONTRAST TECHNIQUE: Multidetector CT imaging of the head and cervical spine was performed following the standard protocol  without intravenous contrast. Multiplanar CT image reconstructions of the cervical spine were also generated. COMPARISON:  11/01/2009 FINDINGS: CT HEAD FINDINGS Brain: No acute infarct or hemorrhage. Lateral ventricles and midline structures are unremarkable. There is moderate diffuse cerebral atrophy. No acute extra-axial fluid collections. No mass effect. Vascular: No hyperdense vessel or unexpected calcification. Skull: Normal. Negative for fracture or focal lesion. Sinuses/Orbits: No acute finding. Other: None. CT CERVICAL SPINE FINDINGS Alignment: Alignment is anatomic. Skull base and vertebrae: No acute displaced fractures. Soft tissues and spinal canal: No prevertebral fluid or swelling. No visible canal hematoma. Disc levels: Mild mid cervical facet hypertrophy. No significant spondylosis or compressive sequela. Upper chest: Airway is patent. Lung apices are clear. Other: Reconstructed images demonstrate no additional findings. IMPRESSION: 1. No acute intracranial process. 2. No acute cervical spine fracture. Electronically Signed   By: Randa Ngo M.D.   On: 08/23/2019 18:35   DG Chest Port 1 View  Result Date: 08/23/2019 CLINICAL DATA:  Fever, altered level of consciousness EXAM: PORTABLE CHEST 1 VIEW COMPARISON:  03/12/2018 FINDINGS: Single frontal view of the chest demonstrates an unremarkable cardiac silhouette. There is increased central vascular congestion, with mild diffuse interstitial prominence. No focal consolidation, effusion, or pneumothorax. No acute bony abnormalities. IMPRESSION: 1. Increased central vascular congestion. 2. No acute airspace disease. Electronically Signed   By: Randa Ngo M.D.   On: 08/23/2019 18:15   US ABDOMEN LIMITED RUQ  Result Date: 08/23/2019 CLINICAL DATA:  Pain.  Fever and sepsis with elevated LFTs. EXAM: ULTRASOUND ABDOMEN LIMITED RIGHT UPPER QUADRANT COMPARISON:  None. FINDINGS: Gallbladder: The patient is status post prior cholecystectomy. Common  bile duct: Diameter: The common bile duct is significantly dilated measuring approximately 1.5 cm proximally tapering to approximately 0.9 cm distally. Liver: There is significant intrahepatic biliary ductal dilatation. Portal vein is patent on color Doppler imaging with normal direction of blood flow towards the liver. Other: None. IMPRESSION: 1. Status post cholecystectomy. 2. There is significant intrahepatic and extrahepatic biliary ductal dilatation raising concern for an underlying obstructing process. Follow-up with MRCP/ERCP is recommended. Electronically Signed   By: Constance Holster M.D.   On: 08/23/2019 19:50    ASSESSMENT / PLAN:  Sepsis possibly secondary to cholangitis Acute encephalopathy likely secondary to infectious process  Lactic acidosis-resolved  Continuous telemetry monitoring  Trend WBC, CMP, and monitor fever curve Trend PCT  Follow cultures  Will continue aztreonam, vancomycin, and metronidazole  MRCP results pending  Prn morphine for pain management  Maintain map >65 Hold outpatient antihypertensives for now  NS @75  ml/hr   Chronic atrial fibrillation-stable  Continuous telemetry monitoring   Asthma-stable  Supplemental O2 for dyspnea and/or hypoxia  Prn bronchodilator therapy  GERD  IV pepcid   Type II Diabetes Mellitus SSI  CBG's q4hrs   Best Practice: VTE px: SCD's, will hold outpatient eliquis for now  Diet: NPO for now  Code status: Full Code Family update: Pts husband at bedside and updated regarding plan of care and all questions were answered.  Marda Stalker, [place] Pager [phone] (please enter 7 digits) PCCM Consult Pager [phone] (please enter 7 digits)

## 2019-08-23 NOTE — ED Notes (Signed)
Patient transported to MRI 

## 2019-08-23 NOTE — ED Provider Notes (Signed)
[facility] Emergency Department Provider Note  ____________________________________________  Time seen: Approximately 5:49 PM  I have reviewed the triage vital signs and the nursing notes.   HISTORY  Chief Complaint Altered Mental Status    HPI Bethany Joseph is a 74 y.o. female with past medical history of diabetes, A.fib on Eliquis, hypertension, hypothyroidism, COPD presents to emergency department for evaluation of nausea, 1 episode of vomiting yesterday, abdominal pain and altered mental status today.  Husband states that she was not responding appropriately or acting like herself this afternoon so he called EMS.  Patient had a cholecystectomy roughly 10 years ago.  Husband is unaware of any falls.  No recent illness.  No cough, shortness of breath, diarrhea, constipation.  Past Medical History:  Diagnosis Date  . Acquired hypothyroidism    Diagnosed with peripheral resistance  . Allergic rhinitis   . Aortic valve disorder   . Arrhythmia   . Arthritis   . Asthma   . Asthma without status asthmaticus    unspecified  . At risk for falls    uses cane, arthritis  . Atrial fibrillation ([place])    Permanent at this time CHADSVASc=3 (age 76, DM, female) 05/30/09 - Afib at 107 4/25 A fib 88 with better rate control on Multaq, but dc Multaq since in permanent A Fib, refuses Coumadin therapy   . 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 Diabetes ([place])   . Diabetic neuropathy ([place])    unspecified  . Difficult intubation    has a difficult intubation card  . Fracture of distal femur (HCC)    left periprosthetic, status post ORIF  . GERD (gastroesophageal reflux disease)   . Gout   . Heart murmur   . History of atrial fibrillation   . History of chicken pox   . Humerus distal fracture    left - [place] on 05/21/12  . Hyperkeratosis    Pre-ulcerative hyperkeratotic lesions first MTPJs  . Hyperlipidemia   . Hypertension   . Hyperthyroidism   . Mycotic  toenails   . Osteoarthritis   . Osteoporosis, post-menopausal    Status post bilateral knee replacement. DEXA 09/2009: t=-1.6 spine, t=-3.1 right fem neck  . Paroxysmal tachycardia ([place])    now in chronic A fib but rate is well controlled  . Type II diabetes mellitus [facility])     Patient Active Problem List   Diagnosis Date Noted  . S/P ORIF (open reduction internal fixation) fracture 08/05/2017  . Cerumen impaction 08/05/2017  . Humerus distal fracture   . Hip fracture ([place]) 07/15/2017  . Fracture of multiple pubic rami ([place]) 07/19/2016  . Leg pain 06/25/2016  . 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 Chronic venous insufficiency 03/26/2016  . Essential hypertension 03/26/2016  . Type 2 diabetes mellitus ([place]) 03/26/2016  . COPD (chronic obstructive pulmonary disease) ([place]) 03/26/2016    Past Surgical History:  Procedure Laterality Date  . CHOLECYSTECTOMY    . FEMUR FRACTURE SURGERY Left 2010  . INTRAMEDULLARY (IM) NAIL INTERTROCHANTERIC Right 07/15/2017   Procedure: INTRAMEDULLARY (IM) NAIL INTERTROCHANTRIC;  Surgeon: Hessie Knows, MD;  Location: [facility] ORS;  Service: Orthopedics;  Laterality: Right;  . JOINT REPLACEMENT  2007   knee replacement surgeries  . ORIF FIBULA FRACTURE    . ORIF HUMERUS FRACTURE Left 05/28/2012   supracondylar distal  . REPLACEMENT TOTAL KNEE BILATERAL    . [place]  . TOTAL ABDOMINAL  [place]  . TOTAL KNEE ARTHROPLASTY Bilateral     Prior to Admission medications   Medication Sig Start Date End Date Taking? Authorizing Provider  acetaminophen (TYLENOL) 325 MG tablet Take 650 mg by mouth every 4 (four) hours as needed.    [provider]  albuterol (PROAIR HFA) 108 (90 Base) MCG/ACT inhaler USE 2 PUFFS TWICE DAILY AS NEEDED shortness of breath 04/02/14   [provider]  aspirin 325 MG tablet Take 325 mg by mouth daily.    [provider]  bisacodyl (DULCOLAX) 10 MG suppository Place 10 mg rectally as needed for moderate constipation.    [provider]  carbamide peroxide (DEBROX) 6.5 % OTIC solution Place 1 drop into both ears 2 (two) times daily.    [provider]  Cholecalciferol 4000 units CAPS Take 1 capsule by mouth daily.    [provider]  digoxin (LANOXIN) 0.125 MG tablet Take 0.125 mg by mouth daily.    [provider]  ELIQUIS 5 MG TABS tablet Take 5 mg by mouth every 12 (twelve) hours. 06/24/17   [provider]  furosemide (LASIX) 20 MG tablet Take 20 mg by mouth daily.     [provider]  gabapentin (NEURONTIN) 400 MG capsule Take 400 mg by mouth at bedtime.    [provider]  levothyroxine (SYNTHROID, LEVOTHROID) 25 MCG tablet Take 25 mcg by mouth daily before breakfast.    [provider]  liothyronine (CYTOMEL) 25 MCG tablet Take 100 mcg by mouth daily. 4 tabs    [provider]  loratadine (CLARITIN) 10 MG tablet Take 10 mg by mouth daily.    [provider]  metFORMIN (GLUCOPHAGE-XR) 500 MG 24 hr tablet Take 1,000 mg by mouth every evening.    [provider]  nebivolol (BYSTOLIC) 2.5 MG tablet Take 2.5 mg by mouth daily as needed (hypertension).    [provider]  Omega 3 1200 MG CAPS Take 1,200 mg by mouth daily.    [provider]  omeprazole (PRILOSEC) 40 MG capsule TAKE ONE CAPSULE BY MOUTH DAILY 06/13/15   [provider]  oxyCODONE (OXY IR/ROXICODONE) 5 MG immediate release tablet Take 1 tablet (5 mg total) by mouth every 4 (four) hours as needed for breakthrough pain. 07/23/17   Toni Arthurs, NP  pravastatin (PRAVACHOL) 20 MG tablet Take 20 mg by mouth. 10/24/15   [provider]  raloxifene (EVISTA) 60 MG tablet TAKE ONE (1) TABLET BY MOUTH EVERY DAY 02/22/16   [provider]  sennosides-docusate sodium (SENOKOT-S) 8.6-50 MG tablet Take 2 tablets  by mouth 2 (two) times daily.    [provider]  vitamin E 400 UNIT capsule Take 400 Units by mouth 3 (three) times daily.     [provider]  zolpidem (AMBIEN) 5 MG tablet Take 5 mg by mouth at bedtime as needed for sleep.    [provider]    Allergies Neosporin [neomycin-polymyxin-gramicidin], Tape, Codeine, Latex, Neomycin-bacitracin zn-polymyx, and Penicillins  Family History  Problem Relation Age of Onset  . Colon cancer Mother   . Osteoporosis Mother   . Arthritis Mother   . Early death Mother   . Diabetes Father   . Hypertension Father   . Heart attack Father   . Coronary artery disease Father   . Early death Father   . Heart disease Father   . Hyperlipidemia Father   . Depression  Brother   . Diabetes Brother   . Mental illness Brother   . Asthma Maternal Grandfather   . COPD Maternal Grandfather   . Asthma Maternal Aunt   . COPD Maternal Aunt   . Cancer Maternal Aunt     Social History Social History   Tobacco Use  . Smoking status: Former Smoker    Types: Cigarettes    Quit date: 02/13/1979    Years since quitting: 40.5  . Smokeless tobacco: Never Used  Vaping Use  . Vaping Use: Never used  Substance Use Topics  . Alcohol use: Yes    Alcohol/week: 14.0 standard drinks    Types: 14 Glasses of wine per week  . Drug use: No     Review of Systems  Constitutional: Positive for fever. ENT: No upper respiratory complaints. Cardiovascular: No chest pain. Respiratory: No cough. No SOB. Gastrointestinal: Positive for abdominal pain.  No nausea, no vomiting.  Musculoskeletal: Negative for musculoskeletal pain. Skin: Negative for rash, abrasions, lacerations, ecchymosis. Neurological: Negative for numbness or tingling. Positive for headache.   ____________________________________________   PHYSICAL EXAM:  VITAL SIGNS: ED Triage Vitals  Enc Vitals Group     BP      Pulse      Resp      Temp      Temp src      SpO2       Weight      Height      Head Circumference      Peak Flow      Pain Score      Pain Loc      Pain Edu?      Excl. in [place]?      Constitutional: Alert. Well appearing and in no acute distress.  Oriented to person and place.  Will not answer date. Eyes: Conjunctivae are normal. PERRL. EOMI. Head: Atraumatic. ENT:      Ears:      Nose: No congestion/rhinnorhea.      Mouth/Throat: Mucous membranes are moist.  Neck: No stridor.   Cardiovascular: Normal rate, regular rhythm.  Good peripheral circulation. Respiratory: Normal respiratory effort without tachypnea or retractions. Lungs CTAB. Good air entry to the bases with no decreased or absent breath sounds. Gastrointestinal: Bowel sounds 4 quadrants. Diffuse abdominal tenderness to palpation, worse in the right upper quadrant. No guarding or rigidity. No palpable masses. No distention.  Musculoskeletal: Full range of motion to all extremities. No gross deformities appreciated. Neurologic:  Normal speech and language. No gross focal neurologic deficits are appreciated.  Skin:  Skin is warm, dry and intact. No rash noted. Psychiatric: Mood and affect are normal. Speech and behavior are normal. Patient exhibits appropriate insight and judgement.   ____________________________________________   LABS (all labs ordered are listed, but only abnormal results are displayed)  Labs Reviewed  COMPREHENSIVE METABOLIC PANEL - Abnormal; Notable for the following components:      Result Value   Sodium 133 (*)    Glucose, Bld 205 (*)    Calcium 8.4 (*)    Total Protein 8.3 (*)    Albumin 3.2 (*)    AST 72 (*)    Alkaline Phosphatase 480 (*)    Total Bilirubin 4.1 (*)    All other components within normal limits  CBC WITH DIFFERENTIAL/PLATELET - Abnormal; Notable for the following components:   WBC 16.9 (*)    Hemoglobin 11.6 (*)    HCT 33.9 (*)    RDW 16.0 (*)  Neutro Abs 15.2 (*)    Lymphs Abs 0.6 (*)    All other components within  normal limits  LACTIC ACID, PLASMA - Abnormal; Notable for the following components:   Lactic Acid, Venous 2.4 (*)    All other components within normal limits  URINALYSIS, COMPLETE (UACMP) WITH MICROSCOPIC - Abnormal; Notable for the following components:   Color, Urine AMBER (*)    APPearance HAZY (*)    Leukocytes,Ua TRACE (*)    Bacteria, UA RARE (*)    Non Squamous Epithelial PRESENT (*)    All other components within normal limits  TROPONIN I (HIGH SENSITIVITY) - Abnormal; Notable for the following components:   Troponin I (High Sensitivity) 26 (*)    All other components within normal limits  SARS CORONAVIRUS 2 BY RT PCR (HOSPITAL ORDER, [place] [facility])  CULTURE, BLOOD (ROUTINE X 2)  CULTURE, BLOOD (ROUTINE X 2)  URINE CULTURE  APTT  PROTIME-INR  DIGOXIN LEVEL  LIPASE, BLOOD  LACTIC ACID, PLASMA  TROPONIN I (HIGH SENSITIVITY)   ____________________________________________  EKG   ____________________________________________  RADIOLOGY Robinette Haines, personally viewed and evaluated these images (plain radiographs) as part of my medical decision making, as well as reviewing the written report by the radiologist.  CT Head Wo Contrast  Result Date: 08/23/2019 CLINICAL DATA:  Altered level of consciousness, combative, frequent falls EXAM: CT HEAD WITHOUT CONTRAST CT CERVICAL SPINE WITHOUT CONTRAST TECHNIQUE: Multidetector CT imaging of the head and cervical spine was performed following the standard protocol without intravenous contrast. Multiplanar CT image reconstructions of the cervical spine were also generated. COMPARISON:  11/01/2009 FINDINGS: CT HEAD FINDINGS Brain: No acute infarct or hemorrhage. Lateral ventricles and midline structures are unremarkable. There is moderate diffuse cerebral atrophy. No acute extra-axial fluid collections. No mass effect. Vascular: No hyperdense vessel or unexpected calcification. Skull: Normal. Negative for  fracture or focal lesion. Sinuses/Orbits: No acute finding. Other: None. CT CERVICAL SPINE FINDINGS Alignment: Alignment is anatomic. Skull base and vertebrae: No acute displaced fractures. Soft tissues and spinal canal: No prevertebral fluid or swelling. No visible canal hematoma. Disc levels: Mild mid cervical facet hypertrophy. No significant spondylosis or compressive sequela. Upper chest: Airway is patent. Lung apices are clear. Other: Reconstructed images demonstrate no additional findings. IMPRESSION: 1. No acute intracranial process. 2. No acute cervical spine fracture. Electronically Signed   By: Randa Ngo M.D.   On: 08/23/2019 18:35   CT Cervical Spine Wo Contrast  Result Date: 08/23/2019 CLINICAL DATA:  Altered level of consciousness, combative, frequent falls EXAM: CT HEAD WITHOUT CONTRAST CT CERVICAL SPINE WITHOUT CONTRAST TECHNIQUE: Multidetector CT imaging of the head and cervical spine was performed following the standard protocol without intravenous contrast. Multiplanar CT image reconstructions of the cervical spine were also generated. COMPARISON:  11/01/2009 FINDINGS: CT HEAD FINDINGS Brain: No acute infarct or hemorrhage. Lateral ventricles and midline structures are unremarkable. There is moderate diffuse cerebral atrophy. No acute extra-axial fluid collections. No mass effect. Vascular: No hyperdense vessel or unexpected calcification. Skull: Normal. Negative for fracture or focal lesion. Sinuses/Orbits: No acute finding. Other: None. CT CERVICAL SPINE FINDINGS Alignment: Alignment is anatomic. Skull base and vertebrae: No acute displaced fractures. Soft tissues and spinal canal: No prevertebral fluid or swelling. No visible canal hematoma. Disc levels: Mild mid cervical facet hypertrophy. No significant spondylosis or compressive sequela. Upper chest: Airway is patent. Lung apices are clear. Other: Reconstructed images demonstrate no additional findings. IMPRESSION: 1. No acute  intracranial  process. 2. No acute cervical spine fracture. Electronically Signed   By: Randa Ngo M.D.   On: 08/23/2019 18:35   DG Chest Port 1 View  Result Date: 08/23/2019 CLINICAL DATA:  Fever, altered level of consciousness EXAM: PORTABLE CHEST 1 VIEW COMPARISON:  03/12/2018 FINDINGS: Single frontal view of the chest demonstrates an unremarkable cardiac silhouette. There is increased central vascular congestion, with mild diffuse interstitial prominence. No focal consolidation, effusion, or pneumothorax. No acute bony abnormalities. IMPRESSION: 1. Increased central vascular congestion. 2. No acute airspace disease. Electronically Signed   By: Randa Ngo M.D.   On: 08/23/2019 18:15   US ABDOMEN LIMITED RUQ  Result Date: 08/23/2019 CLINICAL DATA:  Pain.  Fever and sepsis with elevated LFTs. EXAM: ULTRASOUND ABDOMEN LIMITED RIGHT UPPER QUADRANT COMPARISON:  None. FINDINGS: Gallbladder: The patient is status post prior cholecystectomy. Common bile duct: Diameter: The common bile duct is significantly dilated measuring approximately 1.5 cm proximally tapering to approximately 0.9 cm distally. Liver: There is significant intrahepatic biliary ductal dilatation. Portal vein is patent on color Doppler imaging with normal direction of blood flow towards the liver. Other: None. IMPRESSION: 1. Status post cholecystectomy. 2. There is significant intrahepatic and extrahepatic biliary ductal dilatation raising concern for an underlying obstructing process. Follow-up with MRCP/ERCP is recommended. Electronically Signed   By: Constance Holster M.D.   On: 08/23/2019 19:50    ____________________________________________    PROCEDURES  Procedure(s) performed:    Procedures    Medications  vancomycin (VANCOCIN) IVPB 1000 mg/200 mL premix (1,000 mg Intravenous New Bag/Given 08/23/19 2035)  acetaminophen (TYLENOL) tablet 650 mg (650 mg Oral Given 08/23/19 1902)  aztreonam (AZACTAM) 2 g in sodium  chloride 0.9 % 100 mL IVPB (0 g Intravenous Stopped 08/23/19 2023)  metroNIDAZOLE (FLAGYL) IVPB 500 mg (0 mg Intravenous Stopped 08/23/19 2023)  sodium chloride 0.9 % bolus 1,000 mL (1,000 mLs Intravenous New Bag/Given 08/23/19 1851)     ____________________________________________   INITIAL IMPRESSION / ASSESSMENT AND PLAN / ED COURSE  Pertinent labs & imaging results that were available during my care of the patient were reviewed by me and considered in my medical decision making (see chart for details).  Review of the [place] CSRS was performed in accordance of the [place] prior to dispensing any controlled drugs.   Patient's diagnosis is consistent with cholangitis, sepsis, altered mental status.  Patient is febrile at 103.  She has leukocytosis 16.9.  Lactic acid elevated at 2.4. AST 72, ALT 37, Alk Phos 480 and bili 4.1. Fluids and broad spectrum antibiotics of vancomycin, aztreonam, and flagyl were started for sepsis.  Ultrasound shows significant intrahepatic and extrahepatic biliary ductal dilation concerning for cholangitis.   Dr. Vicente Males was consulted and will evaluate the patient in the morning to discuss with Dr. Allen Norris for ERCP tomorrow. See MD note by Dr. Kerman Passey. Dr. Kerman Passey has been in to evaluate the patient and has admitted the patient to ICU.    Carie Kapuscinski was evaluated in Emergency Department on 08/23/2019 for the symptoms described in the history of present illness. She was evaluated in the context of the global COVID-19 pandemic, which necessitated consideration that the patient might be at risk for infection with the SARS-CoV-2 virus that causes COVID-19. Institutional protocols and algorithms that pertain to the evaluation of patients at risk for COVID-19 are in a state of rapid change based on information released by regulatory bodies including the CDC and federal and state organizations. These policies and algorithms were followed during  the patient's care in the  ED.  ____________________________________________  FINAL CLINICAL IMPRESSION(S) / ED DIAGNOSES  Final diagnoses:  Sepsis, due to unspecified organism, unspecified whether acute organ dysfunction present ([place])  Acute cholangitis  Altered mental status, unspecified altered mental status type      NEW MEDICATIONS STARTED DURING THIS VISIT:  ED Discharge Orders    None          This chart was dictated using voice recognition software/Dragon. Despite best efforts to proofread, errors can occur which can change the meaning. Any change was purely unintentional.    Laban Emperor, PA-C 08/23/19 2105    Harvest Dark, MD 08/23/19 [phone]

## 2019-08-23 NOTE — ED Notes (Signed)
Pt c/o claustrophobia and is going for MRI. Pt given pain medications and anti-nausea medication in attempts to calm her during MRI, d/t admitting MD unable to return message.

## 2019-08-23 NOTE — ED Notes (Signed)
Critical mag 0.7 reported to EDP, see MAR. Will update admitting MD.

## 2019-08-23 NOTE — ED Provider Notes (Addendum)
-----------------------------------------   6:34 PM on 08/23/2019 -----------------------------------------  I have personally seen and evaluated the patient in conjunction with physician assistant Enid Derry.  Patient found to be confused at times with temperature 103, white blood cell count of 16,000 meeting sepsis criteria.  Patient's LFTs show mild elevation especially with a bilirubin of 4.1.  On my examination patient does have mild to moderate tenderness across the upper abdomen.  We will obtain a right upper quadrant ultrasound to further evaluate.  Patient meets sepsis criteria I have started broad-spectrum antibiotics for the patient.  EKG viewed and interpreted by myself shows atrial fibrillation at 88 bpm with a narrow QRS, normal axis, normal intervals patient does have what appears to be mild ST depression fairly diffusely however difficult given A. fib rhythm.  Reassuringly troponin is not significantly elevated.  Patient's right upper quadrant ultrasound is concerning for CBD obstruction.  Patient is approximately 10 years status post cholecystectomy.  Spoke to GI medicine Dr. Servando Snare will be here tomorrow per Dr. Tobi Bastos.  Given the concern for possible cholangitis I did speak to GI at [facility] who states they would not perform an ERCP tonight it would be tomorrow anyways.  As the patient is otherwise stable appearing we will obtain an MRCP and admit to the intensive care unit with plan to likely perform ERCP tomorrow if needed otherwise patient will be continued on IV antibiotics.   Minna Antis, MD 08/23/19 2045    Minna Antis, MD 08/23/19 2126

## 2019-08-24 DIAGNOSIS — A419 Sepsis, unspecified organism: Principal | ICD-10-CM

## 2019-08-24 DIAGNOSIS — R6520 Severe sepsis without septic shock: Secondary | ICD-10-CM

## 2019-08-24 LAB — CBC WITH DIFFERENTIAL/PLATELET
Abs Immature Granulocytes: 0.04 10*3/uL (ref 0.00–0.07)
Basophils Absolute: 0 10*3/uL (ref 0.0–0.1)
Basophils Relative: 0 %
Eosinophils Absolute: 0.1 10*3/uL (ref 0.0–0.5)
Eosinophils Relative: 1 %
HCT: 27.5 % — ABNORMAL LOW (ref 36.0–46.0)
Hemoglobin: 9.1 g/dL — ABNORMAL LOW (ref 12.0–15.0)
Immature Granulocytes: 0 %
Lymphocytes Relative: 11 %
Lymphs Abs: 1.3 10*3/uL (ref 0.7–4.0)
MCH: 30 pg (ref 26.0–34.0)
MCHC: 33.1 g/dL (ref 30.0–36.0)
MCV: 90.8 fL (ref 80.0–100.0)
Monocytes Absolute: 0.8 10*3/uL (ref 0.1–1.0)
Monocytes Relative: 7 %
Neutro Abs: 9.4 10*3/uL — ABNORMAL HIGH (ref 1.7–7.7)
Neutrophils Relative %: 81 %
Platelets: 254 10*3/uL (ref 150–400)
RBC: 3.03 MIL/uL — ABNORMAL LOW (ref 3.87–5.11)
RDW: 16.3 % — ABNORMAL HIGH (ref 11.5–15.5)
WBC: 11.6 10*3/uL — ABNORMAL HIGH (ref 4.0–10.5)
nRBC: 0 % (ref 0.0–0.2)

## 2019-08-24 LAB — COMPREHENSIVE METABOLIC PANEL
ALT: 33 U/L (ref 0–44)
AST: 68 U/L — ABNORMAL HIGH (ref 15–41)
Albumin: 2.3 g/dL — ABNORMAL LOW (ref 3.5–5.0)
Alkaline Phosphatase: 362 U/L — ABNORMAL HIGH (ref 38–126)
Anion gap: 4 — ABNORMAL LOW (ref 5–15)
BUN: 14 mg/dL (ref 8–23)
CO2: 26 mmol/L (ref 22–32)
Calcium: 6.8 mg/dL — ABNORMAL LOW (ref 8.9–10.3)
Chloride: 110 mmol/L (ref 98–111)
Creatinine, Ser: 0.72 mg/dL (ref 0.44–1.00)
GFR calc Af Amer: >60 mL/min (ref >60)
GFR calc non Af Amer: >60 mL/min (ref >60)
Glucose, Bld: 127 mg/dL — ABNORMAL HIGH (ref 70–99)
Potassium: 3.2 mmol/L — ABNORMAL LOW (ref 3.5–5.1)
Sodium: 140 mmol/L (ref 135–145)
Total Bilirubin: 4.1 mg/dL — ABNORMAL HIGH (ref 0.3–1.2)
Total Protein: 6.2 g/dL — ABNORMAL LOW (ref 6.5–8.1)

## 2019-08-24 LAB — MRSA PCR SCREENING: MRSA by PCR: NEGATIVE

## 2019-08-24 LAB — PHOSPHORUS: Phosphorus: 3.9 mg/dL (ref 2.5–4.6)

## 2019-08-24 LAB — GLUCOSE, CAPILLARY
Glucose-Capillary: 111 mg/dL — ABNORMAL HIGH (ref 70–99)
Glucose-Capillary: 135 mg/dL — ABNORMAL HIGH (ref 70–99)
Glucose-Capillary: 200 mg/dL — ABNORMAL HIGH (ref 70–99)
Glucose-Capillary: 212 mg/dL — ABNORMAL HIGH (ref 70–99)

## 2019-08-24 LAB — TROPONIN I (HIGH SENSITIVITY): Troponin I (High Sensitivity): 24 ng/L — ABNORMAL HIGH (ref <18)

## 2019-08-24 LAB — MAGNESIUM: Magnesium: 2.5 mg/dL — ABNORMAL HIGH (ref 1.7–2.4)

## 2019-08-24 LAB — PROCALCITONIN: Procalcitonin: 2.37 ng/mL

## 2019-08-24 MED ORDER — CHLORHEXIDINE GLUCONATE CLOTH 2 % EX PADS
6.0000 | MEDICATED_PAD | Freq: Every day | CUTANEOUS | Status: DC
  Administered 2019-08-24 – 2019-08-27 (×3): 6 via TOPICAL

## 2019-08-24 MED ORDER — POTASSIUM CHLORIDE 10 MEQ/100ML IV SOLN
10.0000 meq | INTRAVENOUS | Status: AC
  Administered 2019-08-24 (×3): 10 meq via INTRAVENOUS
  Filled 2019-08-24 (×3): qty 100

## 2019-08-24 MED ORDER — HYDROCODONE-ACETAMINOPHEN 5-325 MG PO TABS
1.0000 | ORAL_TABLET | ORAL | Status: DC | PRN
  Administered 2019-08-24 – 2019-08-26 (×7): 1 via ORAL
  Filled 2019-08-24 (×7): qty 1

## 2019-08-24 MED ORDER — PRAVASTATIN SODIUM 20 MG PO TABS
20.0000 mg | ORAL_TABLET | Freq: Every day | ORAL | Status: DC
  Administered 2019-08-26: 20 mg via ORAL
  Filled 2019-08-24: qty 1

## 2019-08-24 MED ORDER — VANCOMYCIN HCL 750 MG/150ML IV SOLN
750.0000 mg | Freq: Two times a day (BID) | INTRAVENOUS | Status: DC
  Filled 2019-08-24 (×2): qty 150

## 2019-08-24 MED ORDER — SODIUM CHLORIDE 0.9 % IV SOLN
1.0000 g | Freq: Three times a day (TID) | INTRAVENOUS | Status: DC
  Administered 2019-08-25 (×2): 1 g via INTRAVENOUS
  Filled 2019-08-24 (×4): qty 1

## 2019-08-24 MED ORDER — SODIUM CHLORIDE 0.9 % IV SOLN
1.0000 g | Freq: Three times a day (TID) | INTRAVENOUS | Status: DC
  Administered 2019-08-24 (×2): 1 g via INTRAVENOUS
  Filled 2019-08-24 (×4): qty 1

## 2019-08-24 MED ORDER — GABAPENTIN 400 MG PO CAPS
400.0000 mg | ORAL_CAPSULE | Freq: Every day | ORAL | Status: DC
  Administered 2019-08-24 – 2019-08-26 (×3): 400 mg via ORAL
  Filled 2019-08-24 (×3): qty 1

## 2019-08-24 MED ORDER — METRONIDAZOLE IN NACL 5-0.79 MG/ML-% IV SOLN
500.0000 mg | Freq: Three times a day (TID) | INTRAVENOUS | Status: DC
  Administered 2019-08-24 – 2019-08-26 (×7): 500 mg via INTRAVENOUS
  Filled 2019-08-24 (×12): qty 100

## 2019-08-24 MED ORDER — SODIUM CHLORIDE 0.9 % IV BOLUS
500.0000 mL | Freq: Once | INTRAVENOUS | Status: AC
  Administered 2019-08-24: 500 mL via INTRAVENOUS

## 2019-08-24 MED ORDER — VANCOMYCIN HCL 1250 MG/250ML IV SOLN
1250.0000 mg | INTRAVENOUS | Status: DC
  Administered 2019-08-24: 1250 mg via INTRAVENOUS
  Filled 2019-08-24 (×2): qty 250

## 2019-08-24 MED ORDER — GABAPENTIN 100 MG PO CAPS
100.0000 mg | ORAL_CAPSULE | Freq: Every day | ORAL | Status: DC
  Administered 2019-08-25 – 2019-08-27 (×3): 100 mg via ORAL
  Filled 2019-08-24 (×4): qty 1

## 2019-08-24 MED ORDER — DOXEPIN HCL 10 MG PO CAPS
10.0000 mg | ORAL_CAPSULE | Freq: Every day | ORAL | Status: DC
  Administered 2019-08-24 – 2019-08-26 (×3): 10 mg via ORAL
  Filled 2019-08-24 (×5): qty 1

## 2019-08-24 MED ORDER — MORPHINE SULFATE (PF) 2 MG/ML IV SOLN
1.0000 mg | INTRAVENOUS | Status: DC | PRN
  Administered 2019-08-25 – 2019-08-26 (×2): 2 mg via INTRAVENOUS
  Filled 2019-08-24 (×3): qty 1

## 2019-08-24 NOTE — Progress Notes (Signed)
CHIEF COMPLAINT:   nausea Subjective  Alert and awake Complains of back pain HR in 30's Afebrile this AM       Objective   Examination:  General exam: Appears calm and comfortable  Respiratory system: Clear to auscultation. Respiratory effort normal. HEENT: [name]/AT, PERRLA, no thrush, no stridor. Cardiovascular system: S1 & S2 heard, RRR. No JVD, murmurs, rubs, gallops or clicks. No pedal edema. Gastrointestinal system: Abdomen is nondistended, soft and nontender. No organomegaly or masses felt. Normal bowel sounds heard. Central nervous system: Alert and oriented. No focal neurological deficits. Extremities: Symmetric 5 x 5 power. Skin: No rashes, lesions or ulcers Psychiatry: Judgement and insight appear normal. Mood & affect appropriate.   VITALS:  height is 5\' 8"  (1.727 m) and weight is 90.7 kg. Her oral temperature is 97.7 F (36.5 C). Her blood pressure is 132/73 and her pulse is 43 (abnormal). Her respiration is 18 and oxygen saturation is 100%.   I personally reviewed Labs under Results section.  Radiology Reports CT Head Wo Contrast  Result Date: 08/23/2019 CLINICAL DATA:  Altered level of consciousness, combative, frequent falls EXAM: CT HEAD WITHOUT CONTRAST CT CERVICAL SPINE WITHOUT CONTRAST TECHNIQUE: Multidetector CT imaging of the head and cervical spine was performed following the standard protocol without intravenous contrast. Multiplanar CT image reconstructions of the cervical spine were also generated. COMPARISON:  11/01/2009 FINDINGS: CT HEAD FINDINGS Brain: No acute infarct or hemorrhage. Lateral ventricles and midline structures are unremarkable. There is moderate diffuse cerebral atrophy. No acute extra-axial fluid collections. No mass effect. Vascular: No hyperdense vessel or unexpected calcification. Skull: Normal. Negative for fracture or focal lesion. Sinuses/Orbits: No acute finding. Other: None. CT CERVICAL SPINE FINDINGS Alignment: Alignment is  anatomic. Skull base and vertebrae: No acute displaced fractures. Soft tissues and spinal canal: No prevertebral fluid or swelling. No visible canal hematoma. Disc levels: Mild mid cervical facet hypertrophy. No significant spondylosis or compressive sequela. Upper chest: Airway is patent. Lung apices are clear. Other: Reconstructed images demonstrate no additional findings. IMPRESSION: 1. No acute intracranial process. 2. No acute cervical spine fracture. Electronically Signed   By: 11/03/2009 M.D.   On: 08/23/2019 18:35   CT Cervical Spine Wo Contrast  Result Date: 08/23/2019 CLINICAL DATA:  Altered level of consciousness, combative, frequent falls EXAM: CT HEAD WITHOUT CONTRAST CT CERVICAL SPINE WITHOUT CONTRAST TECHNIQUE: Multidetector CT imaging of the head and cervical spine was performed following the standard protocol without intravenous contrast. Multiplanar CT image reconstructions of the cervical spine were also generated. COMPARISON:  11/01/2009 FINDINGS: CT HEAD FINDINGS Brain: No acute infarct or hemorrhage. Lateral ventricles and midline structures are unremarkable. There is moderate diffuse cerebral atrophy. No acute extra-axial fluid collections. No mass effect. Vascular: No hyperdense vessel or unexpected calcification. Skull: Normal. Negative for fracture or focal lesion. Sinuses/Orbits: No acute finding. Other: None. CT CERVICAL SPINE FINDINGS Alignment: Alignment is anatomic. Skull base and vertebrae: No acute displaced fractures. Soft tissues and spinal canal: No prevertebral fluid or swelling. No visible canal hematoma. Disc levels: Mild mid cervical facet hypertrophy. No significant spondylosis or compressive sequela. Upper chest: Airway is patent. Lung apices are clear. Other: Reconstructed images demonstrate no additional findings. IMPRESSION: 1. No acute intracranial process. 2. No acute cervical spine fracture. Electronically Signed   By: 11/03/2009 M.D.   On: 08/23/2019 18:35    MR 3D Recon At Scanner  Result Date: 08/23/2019 CLINICAL DATA:  Right upper quadrant pain.  Biliary dilatation. EXAM: MRI ABDOMEN  WITHOUT AND WITH CONTRAST (INCLUDING MRCP) TECHNIQUE: Multiplanar multisequence MR imaging of the abdomen was performed both before and after the administration of intravenous contrast. Heavily T2-weighted images of the biliary and pancreatic ducts were obtained, and three-dimensional MRCP images were rendered by post processing. CONTRAST:  7.63mL GADAVIST GADOBUTROL 1 MMOL/ML IV SOLN COMPARISON:  Right upper quadrant ultrasound same day FINDINGS: Lower chest: Cardiomegaly Hepatobiliary: There is severe intrahepatic and extrahepatic biliary dilatation. The common bile duct measures 17 mm. There is a large stone within the distal common bile duct (series 3, image 15). Status post cholecystectomy. No focal liver lesion. Pancreas: No mass, inflammatory changes, or other parenchymal abnormality identified. Spleen:  Within normal limits in size and appearance. Adrenals/Urinary Tract: No masses identified. No evidence of hydronephrosis. Stomach/Bowel: Visualized portions within the abdomen are unremarkable. Vascular/Lymphatic: No pathologically enlarged lymph nodes identified. No abdominal aortic aneurysm demonstrated. Other:  None. Musculoskeletal: No suspicious bone lesions identified. IMPRESSION: Severe intrahepatic and extrahepatic biliary dilatation with large stone within the distal common bile duct. Electronically Signed   By: Deatra Robinson M.D.   On: 08/23/2019 22:49   DG Chest Port 1 View  Result Date: 08/23/2019 CLINICAL DATA:  Fever, altered level of consciousness EXAM: PORTABLE CHEST 1 VIEW COMPARISON:  03/12/2018 FINDINGS: Single frontal view of the chest demonstrates an unremarkable cardiac silhouette. There is increased central vascular congestion, with mild diffuse interstitial prominence. No focal consolidation, effusion, or pneumothorax. No acute bony abnormalities.  IMPRESSION: 1. Increased central vascular congestion. 2. No acute airspace disease. Electronically Signed   By: Sharlet Salina M.D.   On: 08/23/2019 18:15   MR ABDOMEN MRCP W WO CONTAST  Result Date: 08/23/2019 CLINICAL DATA:  Right upper quadrant pain.  Biliary dilatation. EXAM: MRI ABDOMEN WITHOUT AND WITH CONTRAST (INCLUDING MRCP) TECHNIQUE: Multiplanar multisequence MR imaging of the abdomen was performed both before and after the administration of intravenous contrast. Heavily T2-weighted images of the biliary and pancreatic ducts were obtained, and three-dimensional MRCP images were rendered by post processing. CONTRAST:  7.91mL GADAVIST GADOBUTROL 1 MMOL/ML IV SOLN COMPARISON:  Right upper quadrant ultrasound same day FINDINGS: Lower chest: Cardiomegaly Hepatobiliary: There is severe intrahepatic and extrahepatic biliary dilatation. The common bile duct measures 17 mm. There is a large stone within the distal common bile duct (series 3, image 15). Status post cholecystectomy. No focal liver lesion. Pancreas: No mass, inflammatory changes, or other parenchymal abnormality identified. Spleen:  Within normal limits in size and appearance. Adrenals/Urinary Tract: No masses identified. No evidence of hydronephrosis. Stomach/Bowel: Visualized portions within the abdomen are unremarkable. Vascular/Lymphatic: No pathologically enlarged lymph nodes identified. No abdominal aortic aneurysm demonstrated. Other:  None. Musculoskeletal: No suspicious bone lesions identified. IMPRESSION: Severe intrahepatic and extrahepatic biliary dilatation with large stone within the distal common bile duct. Electronically Signed   By: Deatra Robinson M.D.   On: 08/23/2019 22:49   US ABDOMEN LIMITED RUQ  Result Date: 08/23/2019 CLINICAL DATA:  Pain.  Fever and sepsis with elevated LFTs. EXAM: ULTRASOUND ABDOMEN LIMITED RIGHT UPPER QUADRANT COMPARISON:  None. FINDINGS: Gallbladder: The patient is status post prior cholecystectomy.  Common bile duct: Diameter: The common bile duct is significantly dilated measuring approximately 1.5 cm proximally tapering to approximately 0.9 cm distally. Liver: There is significant intrahepatic biliary ductal dilatation. Portal vein is patent on color Doppler imaging with normal direction of blood flow towards the liver. Other: None. IMPRESSION: 1. Status post cholecystectomy. 2. There is significant intrahepatic and extrahepatic biliary ductal dilatation raising concern  for an underlying obstructing process. Follow-up with MRCP/ERCP is recommended. Electronically Signed   By: Katherine Mantle M.D.   On: 08/23/2019 19:50       Assessment/Plan:   Sepsis/cholangitis ERCP pending  Cardiology consulted for AFib  ?need for heparin Hold all beta blockers, cardiac meds  ELECTROLYTES -follow labs as needed -replace as needed -pharmacy consultation and following   Transfer to [facility] 7/13     LOS: 1 day   Bethany Joseph

## 2019-08-24 NOTE — ED Provider Notes (Signed)
Patient currently admitted to Dr. Belia Heman team, but remains in the ER.  I noted on telemetry the patient's heart rate occasionally falling into the mid 30s.  I have ordered EKG.  Checked on the patient and her family at the bedside, she reports she is having her typical aches and pains that she is typically on longstanding chronic pain medicine and hungry would like some to eat.  She appears alert oriented and in no acute distress, lab work reviewed, EKG ordered, and I have messaged Dr. Belia Heman who is aware.  Patient is EKG reviewed at 1110 Heart rate 45 QRS 100 QTc 490 Atrial fibrillation, pronounced and a diffuse T wave abnormality concerning for possible ischemia.  Possible global.  Appears somewhat similar to appearance of her presentation twelve-lead but rate has notably slowed   Sharyn Creamer, MD 08/24/19 1110

## 2019-08-24 NOTE — Consult Note (Signed)
CARDIOLOGY CONSULT NOTE               Patient ID: Bethany Joseph MRN: [id number] DOB/AGE: 06/17/1945 74 y.o.  Admit date: 08/23/2019 Referring Physician Dr Belia Heman critical care Primary Physician Rayburn Go NP  Primary Cardiologist Dr Juliann Pares  Reason for Consultation preop clearance atrial fibrillation abnormal EKG, AMS  HPI: Patient is a 74 year old female presented with acute cholecystitis cholangitis and need of an ERCP patient has atrial fibrillation on anticoagulation with Eliquis she been feeling sick recently presented with sepsis with a white count of 17 elevated bilirubin of 4 fever generalized weakness fatigue nausea vomiting patient had altered mental status so the husband brought her in part of the work-up she had procedure imaging of her abdomen which showed a dilated common bile duct and elevated bilirubin suggestive of cholangitis patient was placed on broad-spectrum antibiotic therapy GI was consulted.  The patient is now preop for ERCP but cardiac clearance was requested prior to proceeding patient denies any chest pain she has had episodes of bradycardia atrial fibrillation on anticoagulation she was on digoxin and Bystolic both of which have been held.  Patient complains of ataxia weakness poor balance dyspnea on exertion but no significant chest pain angina so presented to the emergency room for evaluation and subsequently was admitted and cardiology was consulted for preoperative clearance  Review of systems complete and found to be negative unless listed above     Past Medical History:  Diagnosis Date  . Acquired hypothyroidism    Diagnosed with peripheral resistance  . Allergic rhinitis   . Aortic valve disorder   . Arrhythmia   . Arthritis   . Asthma   . Asthma without status asthmaticus    unspecified  . At risk for falls    uses cane, arthritis  . Atrial fibrillation (HCC)    Permanent at this time CHADSVASc=3 (age 1, DM, female) 05/30/09 - Afib at  107 4/25 A fib 88 with better rate control on Multaq, but dc Multaq since in permanent A Fib, refuses Coumadin therapy   . Avascular necrosis (HCC)    left foot  . Diabetes (HCC)   . Diabetic neuropathy (HCC)    unspecified  . Difficult intubation    has a difficult intubation card  . Fracture of distal femur (HCC)    left periprosthetic, status post ORIF  . GERD (gastroesophageal reflux disease)   . Gout   . Heart murmur   . History of atrial fibrillation   . History of chicken pox   . Humerus distal fracture    left - [place] on 05/21/12  . Hyperkeratosis    Pre-ulcerative hyperkeratotic lesions first MTPJs  . Hyperlipidemia   . Hypertension   . Hyperthyroidism   . Mycotic toenails   . Osteoarthritis   . Osteoporosis, post-menopausal    Status post bilateral knee replacement. DEXA 09/2009: t=-1.6 spine, t=-3.1 right fem neck  . Paroxysmal tachycardia (HCC)    now in chronic A fib but rate is well controlled  . Type II diabetes mellitus (HCC)     Past Surgical History:  Procedure Laterality Date  . CHOLECYSTECTOMY    . FEMUR FRACTURE SURGERY Left 2010  . INTRAMEDULLARY (IM) NAIL INTERTROCHANTERIC Right 07/15/2017   Procedure: INTRAMEDULLARY (IM) NAIL INTERTROCHANTRIC;  Surgeon: Kennedy Bucker, MD;  Location: [facility] ORS;  Service: Orthopedics;  Laterality: Right;  . JOINT REPLACEMENT  2007   knee replacement surgeries  . ORIF FIBULA FRACTURE    . ORIF  HUMERUS FRACTURE Left 05/28/2012   supracondylar distal  . REPLACEMENT TOTAL KNEE BILATERAL    . [place]  . TOTAL ABDOMINAL HYSTERECTOMY W/ BILATERAL SALPINGOOPHORECTOMY  1983   [place]  . TOTAL KNEE ARTHROPLASTY Bilateral     (Not in a hospital admission)  Social History   Socioeconomic History  . Marital status: Married    Spouse name: Molly MaduroRobert  . Number of children: 2  . Years of education: 6712  . Highest education level: High school graduate  Occupational History  . Not on file  Tobacco Use  . Smoking  status: Former Smoker    Types: Cigarettes    Quit date: 02/13/1979    Years since quitting: 40.5  . Smokeless tobacco: Never Used  Vaping Use  . Vaping Use: Never used  Substance and Sexual Activity  . Alcohol use: Yes    Alcohol/week: 14.0 standard drinks    Types: 14 Glasses of wine per week  . Drug use: No  . Sexual activity: Not Currently  Other Topics Concern  . Not on file  Social History Narrative  . Not on file   Social Determinants of Health   Financial Resource Strain:   . Difficulty of Paying Living Expenses:   Food Insecurity:   . Worried About Programme researcher, broadcasting/film/videounning Out of Food in the Last Year:   . Baristaan Out of Food in the Last Year:   Transportation Needs:   . Freight forwarderLack of Transportation (Medical):   Marland Kitchen. Lack of Transportation (Non-Medical):   Physical Activity:   . Days of Exercise per Week:   . Minutes of Exercise per Session:   Stress:   . Feeling of Stress :   Social Connections:   . Frequency of Communication with Friends and Family:   . Frequency of Social Gatherings with Friends and Family:   . Attends Religious Services:   . Active Member of Clubs or Organizations:   . Attends BankerClub or Organization Meetings:   Marland Kitchen. Marital Status:   Intimate Partner Violence:   . Fear of Current or Ex-Partner:   . Emotionally Abused:   Marland Kitchen. Physically Abused:   . Sexually Abused:     Family History  Problem Relation Age of Onset  . Colon cancer Mother   . Osteoporosis Mother   . Arthritis Mother   . Early death Mother   . Diabetes Father   . Hypertension Father   . Heart attack Father   . Coronary artery disease Father   . Early death Father   . Heart disease Father   . Hyperlipidemia Father   . Depression Brother   . Diabetes Brother   . Mental illness Brother   . Asthma Maternal Grandfather   . COPD Maternal Grandfather   . Asthma Maternal Aunt   . COPD Maternal Aunt   . Cancer Maternal Aunt       Review of systems complete and found to be negative unless listed above       PHYSICAL EXAM  General: Well developed, well nourished, in no acute distress HEENT:  Normocephalic and atramatic Neck:  No JVD.  Lungs: Clear bilaterally to auscultation and percussion. Heart: HRRR . Normal S1 and S2 without gallops or murmurs.  Abdomen: Bowel sounds are positive, abdomen soft and non-tender  Msk:  Back normal, normal gait. Normal strength and tone for age. Extremities: No clubbing, cyanosis or edema.   Neuro: Alert and oriented X 3. Psych:  Good affect, responds appropriately  Labs:   Lab Results  Component Value Date   WBC 11.6 (H) 08/24/2019   HGB 9.1 (L) 08/24/2019   HCT 27.5 (L) 08/24/2019   MCV 90.8 08/24/2019   PLT 254 08/24/2019    Recent Labs  Lab 08/24/19 0432  NA 140  K 3.2*  CL 110  CO2 26  BUN 14  CREATININE 0.72  CALCIUM 6.8*  PROT 6.2*  BILITOT 4.1*  ALKPHOS 362*  ALT 33  AST 68*  GLUCOSE 127*   Lab Results  Component Value Date   TROPONINI <0.03 03/12/2018   No results found for: CHOL No results found for: HDL No results found for: LDLCALC No results found for: TRIG No results found for: CHOLHDL No results found for: LDLDIRECT    Radiology: CT Head Wo Contrast  Result Date: 08/23/2019 CLINICAL DATA:  Altered level of consciousness, combative, frequent falls EXAM: CT HEAD WITHOUT CONTRAST CT CERVICAL SPINE WITHOUT CONTRAST TECHNIQUE: Multidetector CT imaging of the head and cervical spine was performed following the standard protocol without intravenous contrast. Multiplanar CT image reconstructions of the cervical spine were also generated. COMPARISON:  11/01/2009 FINDINGS: CT HEAD FINDINGS Brain: No acute infarct or hemorrhage. Lateral ventricles and midline structures are unremarkable. There is moderate diffuse cerebral atrophy. No acute extra-axial fluid collections. No mass effect. Vascular: No hyperdense vessel or unexpected calcification. Skull: Normal. Negative for fracture or focal lesion. Sinuses/Orbits: No acute  finding. Other: None. CT CERVICAL SPINE FINDINGS Alignment: Alignment is anatomic. Skull base and vertebrae: No acute displaced fractures. Soft tissues and spinal canal: No prevertebral fluid or swelling. No visible canal hematoma. Disc levels: Mild mid cervical facet hypertrophy. No significant spondylosis or compressive sequela. Upper chest: Airway is patent. Lung apices are clear. Other: Reconstructed images demonstrate no additional findings. IMPRESSION: 1. No acute intracranial process. 2. No acute cervical spine fracture. Electronically Signed   By: Sharlet Salina M.D.   On: 08/23/2019 18:35   CT Cervical Spine Wo Contrast  Result Date: 08/23/2019 CLINICAL DATA:  Altered level of consciousness, combative, frequent falls EXAM: CT HEAD WITHOUT CONTRAST CT CERVICAL SPINE WITHOUT CONTRAST TECHNIQUE: Multidetector CT imaging of the head and cervical spine was performed following the standard protocol without intravenous contrast. Multiplanar CT image reconstructions of the cervical spine were also generated. COMPARISON:  11/01/2009 FINDINGS: CT HEAD FINDINGS Brain: No acute infarct or hemorrhage. Lateral ventricles and midline structures are unremarkable. There is moderate diffuse cerebral atrophy. No acute extra-axial fluid collections. No mass effect. Vascular: No hyperdense vessel or unexpected calcification. Skull: Normal. Negative for fracture or focal lesion. Sinuses/Orbits: No acute finding. Other: None. CT CERVICAL SPINE FINDINGS Alignment: Alignment is anatomic. Skull base and vertebrae: No acute displaced fractures. Soft tissues and spinal canal: No prevertebral fluid or swelling. No visible canal hematoma. Disc levels: Mild mid cervical facet hypertrophy. No significant spondylosis or compressive sequela. Upper chest: Airway is patent. Lung apices are clear. Other: Reconstructed images demonstrate no additional findings. IMPRESSION: 1. No acute intracranial process. 2. No acute cervical spine  fracture. Electronically Signed   By: Sharlet Salina M.D.   On: 08/23/2019 18:35   MR 3D Recon At Scanner  Result Date: 08/23/2019 CLINICAL DATA:  Right upper quadrant pain.  Biliary dilatation. EXAM: MRI ABDOMEN WITHOUT AND WITH CONTRAST (INCLUDING MRCP) TECHNIQUE: Multiplanar multisequence MR imaging of the abdomen was performed both before and after the administration of intravenous contrast. Heavily T2-weighted images of the biliary and pancreatic ducts were obtained, and three-dimensional MRCP images were  rendered by post processing. CONTRAST:  7.81mL GADAVIST GADOBUTROL 1 MMOL/ML IV SOLN COMPARISON:  Right upper quadrant ultrasound same day FINDINGS: Lower chest: Cardiomegaly Hepatobiliary: There is severe intrahepatic and extrahepatic biliary dilatation. The common bile duct measures 17 mm. There is a large stone within the distal common bile duct (series 3, image 15). Status post cholecystectomy. No focal liver lesion. Pancreas: No mass, inflammatory changes, or other parenchymal abnormality identified. Spleen:  Within normal limits in size and appearance. Adrenals/Urinary Tract: No masses identified. No evidence of hydronephrosis. Stomach/Bowel: Visualized portions within the abdomen are unremarkable. Vascular/Lymphatic: No pathologically enlarged lymph nodes identified. No abdominal aortic aneurysm demonstrated. Other:  None. Musculoskeletal: No suspicious bone lesions identified. IMPRESSION: Severe intrahepatic and extrahepatic biliary dilatation with large stone within the distal common bile duct. Electronically Signed   By: Deatra Robinson M.D.   On: 08/23/2019 22:49   DG Chest Port 1 View  Result Date: 08/23/2019 CLINICAL DATA:  Fever, altered level of consciousness EXAM: PORTABLE CHEST 1 VIEW COMPARISON:  03/12/2018 FINDINGS: Single frontal view of the chest demonstrates an unremarkable cardiac silhouette. There is increased central vascular congestion, with mild diffuse interstitial prominence.  No focal consolidation, effusion, or pneumothorax. No acute bony abnormalities. IMPRESSION: 1. Increased central vascular congestion. 2. No acute airspace disease. Electronically Signed   By: Sharlet Salina M.D.   On: 08/23/2019 18:15   MR ABDOMEN MRCP W WO CONTAST  Result Date: 08/23/2019 CLINICAL DATA:  Right upper quadrant pain.  Biliary dilatation. EXAM: MRI ABDOMEN WITHOUT AND WITH CONTRAST (INCLUDING MRCP) TECHNIQUE: Multiplanar multisequence MR imaging of the abdomen was performed both before and after the administration of intravenous contrast. Heavily T2-weighted images of the biliary and pancreatic ducts were obtained, and three-dimensional MRCP images were rendered by post processing. CONTRAST:  7.47mL GADAVIST GADOBUTROL 1 MMOL/ML IV SOLN COMPARISON:  Right upper quadrant ultrasound same day FINDINGS: Lower chest: Cardiomegaly Hepatobiliary: There is severe intrahepatic and extrahepatic biliary dilatation. The common bile duct measures 17 mm. There is a large stone within the distal common bile duct (series 3, image 15). Status post cholecystectomy. No focal liver lesion. Pancreas: No mass, inflammatory changes, or other parenchymal abnormality identified. Spleen:  Within normal limits in size and appearance. Adrenals/Urinary Tract: No masses identified. No evidence of hydronephrosis. Stomach/Bowel: Visualized portions within the abdomen are unremarkable. Vascular/Lymphatic: No pathologically enlarged lymph nodes identified. No abdominal aortic aneurysm demonstrated. Other:  None. Musculoskeletal: No suspicious bone lesions identified. IMPRESSION: Severe intrahepatic and extrahepatic biliary dilatation with large stone within the distal common bile duct. Electronically Signed   By: Deatra Robinson M.D.   On: 08/23/2019 22:49   US ABDOMEN LIMITED RUQ  Result Date: 08/23/2019 CLINICAL DATA:  Pain.  Fever and sepsis with elevated LFTs. EXAM: ULTRASOUND ABDOMEN LIMITED RIGHT UPPER QUADRANT COMPARISON:   None. FINDINGS: Gallbladder: The patient is status post prior cholecystectomy. Common bile duct: Diameter: The common bile duct is significantly dilated measuring approximately 1.5 cm proximally tapering to approximately 0.9 cm distally. Liver: There is significant intrahepatic biliary ductal dilatation. Portal vein is patent on color Doppler imaging with normal direction of blood flow towards the liver. Other: None. IMPRESSION: 1. Status post cholecystectomy. 2. There is significant intrahepatic and extrahepatic biliary ductal dilatation raising concern for an underlying obstructing process. Follow-up with MRCP/ERCP is recommended. Electronically Signed   By: Katherine Mantle M.D.   On: 08/23/2019 19:50    EKG: Atrial fibrillation bradycardia with diffuse ST segment depression inferior laterally somewhat worse from  baseline  ASSESSMENT AND PLAN:  Preop for ERCP Sepsis Possible cholangitis Abnormal EKG Diabetes type 2 Atrial fibrillation paroxysmal Anticoagulation Hyperlipidemia Bradycardia Ataxia/falls . Plan Agree with ICU level care for management and control of sepsis Recommend continue to hold Eliquis for anticoagulation and switch to short-term heparin as you are doing Continue to hold and digoxin and Bystolic therapy because of bradycardia expected heart rate will improve no clear location for pacemaker at this point Continue Pravachol therapy for hyperlipidemia Continue broad-spectrum antibiotic therapy for sepsis and possible cholangitis Maintain diabetes management and control Patient appears to be an acceptable moderate risk for ERCP, no intervention is necessary to modify the risk at this point Follow-up EKGs of bradycardia as well as abnormal ST segment changes Worsening EKG changes consistent with baseline EKG just slightly more pronounced. Patient still needs an ischemia work-up probably as an outpatient once sepsis and bile duct evaluation complete   Signed: Alwyn Pea MD 08/24/2019, 1:18 PM

## 2019-08-24 NOTE — Consult Note (Signed)
Jonathon Bellows , MD [address], [place], [place], [place], [id number] [place], [place], [place], [address] Phone: [phone]  Fax: [phone]  Consultation  Referring Provider:     ER Primary Care Physician:  Guadalupe Maple, MD Primary Gastroenterologist:  None         Reason for Consultation:     Acute cholangitis   Date of Admission:  08/23/2019 Date of Consultation:  08/24/2019         HPI:   Bethany Joseph is a 74 y.o. female who is S/p Cholecystectomy presented to the ER with nausea,vomiting , abdominal pain and altered mental status . On admission found to have lactic acidosis, WCC 16.9 , Alk phos 480 and T bilirubin 4.1. CR 0.72, USG RUQ demonstrated dilated intra and extra hepatic ducts. CBD 1.5 cm . MRCP subsequently demonstrated large stone in the distal CBD(17 mm).[place] thos morning down to 11.6 . Commenced on antibiotics .   She states that over the past few months she has had on and off episodes of abdominal pain in the epigastric area associated with nausea but this was the first time when she presented with fever and subsequently got confused and was brought in by her husband.  When I went to see her in the emergency room her confusion had completely resolved and we had a complete conversation.  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  Presently she has no abdominal pain, no nausea, no fever.  Feels much better.    Past Medical History:  Diagnosis Date  . Acquired hypothyroidism    Diagnosed with peripheral resistance  . Allergic rhinitis   . Aortic valve disorder   . Arrhythmia   . Arthritis   . Asthma   . Asthma without status asthmaticus    unspecified  . At risk for falls    uses cane, arthritis  . Atrial fibrillation ([place])    Permanent at this time CHADSVASc=3 (age 75, DM, female) 05/30/09 - Afib at 107 4/25 A fib 88 with better rate control on Multaq, but dc Multaq since in permanent A Fib, refuses Coumadin therapy    . 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 Diabetes ([place])   . Diabetic neuropathy ([place])    unspecified  . Difficult intubation    has a difficult intubation card  . Fracture of distal femur ([facility])    left periprosthetic, status post ORIF  . GERD (gastroesophageal reflux disease)   . Gout   . Heart murmur   . History of atrial fibrillation   . History of chicken pox   . Humerus distal fracture    left - [place] on 05/21/12  . Hyperkeratosis    Pre-ulcerative hyperkeratotic lesions first MTPJs  . Hyperlipidemia   . Hypertension   . Hyperthyroidism   . Mycotic toenails   . Osteoarthritis   . Osteoporosis, post-menopausal    Status post bilateral knee replacement. DEXA 09/2009: t=-1.6 spine, t=-3.1 right fem neck  . Paroxysmal tachycardia ([place])    now in chronic A fib but rate is well controlled  . Type II diabetes mellitus ([place])     Past Surgical History:  Procedure Laterality Date  . CHOLECYSTECTOMY    . FEMUR FRACTURE SURGERY Left 2010  . INTRAMEDULLARY (IM) NAIL INTERTROCHANTERIC Right 07/15/2017   Procedure: INTRAMEDULLARY (IM) NAIL INTERTROCHANTRIC;  Surgeon: Hessie Knows, MD;  Location: [facility] ORS;  Service: Orthopedics;  Laterality: Right;  .  JOINT REPLACEMENT  2007   knee replacement surgeries  . ORIF FIBULA FRACTURE    . ORIF HUMERUS FRACTURE Left 05/28/2012   supracondylar distal  . REPLACEMENT TOTAL KNEE BILATERAL    . [place]  . TOTAL ABDOMINAL HYSTERECTOMY W/ BILATERAL SALPINGOOPHORECTOMY  1983   [place]  . TOTAL KNEE ARTHROPLASTY Bilateral     Prior to Admission medications   Medication Sig Start Date End Date Taking? Authorizing Provider  acetaminophen (TYLENOL) 325 MG tablet Take 650 mg by mouth every 4 (four) hours as needed.   Yes [provider]  albuterol (PROAIR HFA) 108 (90 Base) MCG/ACT inhaler USE 2 PUFFS TWICE DAILY AS NEEDED shortness of breath 04/02/14  Yes [provider]  Bacillus Coagulans-Inulin (ALIGN  PREBIOTIC-PROBIOTIC PO) Take 1 capsule by mouth daily.   Yes [provider]  bisacodyl (DULCOLAX) 10 MG suppository Place 10 mg rectally as needed for moderate constipation.   Yes [provider]  Cholecalciferol 4000 units CAPS Take 1 capsule by mouth daily.   Yes [provider]  digoxin (LANOXIN) 0.125 MG tablet Take 0.125 mg by mouth daily.   Yes [provider]  doxepin (SINEQUAN) 10 MG capsule Take 10 mg by mouth at bedtime. 08/03/19  Yes [provider]  ELIQUIS 5 MG TABS tablet Take 5 mg by mouth every 12 (twelve) hours. 06/24/17  Yes [provider]  gabapentin (NEURONTIN) 100 MG capsule Take 100 mg by mouth daily. 08/12/19  Yes [provider]  gabapentin (NEURONTIN) 400 MG capsule Take 400 mg by mouth at bedtime.   Yes [provider]  HYDROcodone-acetaminophen (NORCO/VICODIN) 5-325 MG tablet Take 1 tablet by mouth every 4 (four) hours as needed. 08/03/19  Yes [provider]  levothyroxine (SYNTHROID, LEVOTHROID) 25 MCG tablet Take 25 mcg by mouth daily before breakfast.   Yes [provider]  liothyronine (CYTOMEL) 25 MCG tablet Take 100 mcg by mouth daily. 4 tabs   Yes [provider]  loratadine (CLARITIN) 10 MG tablet Take 10 mg by mouth daily.   Yes [provider]  metFORMIN (GLUCOPHAGE-XR) 500 MG 24 hr tablet Take 1,000 mg by mouth every evening.   Yes [provider]  nebivolol (BYSTOLIC) 2.5 MG tablet Take 2.5 mg by mouth daily as needed (hypertension).   Yes [provider]  Omega 3 1200 MG CAPS Take 1,200 mg by mouth daily.   Yes [provider]  omeprazole (PRILOSEC) 40 MG capsule TAKE ONE CAPSULE BY MOUTH DAILY 06/13/15  Yes [provider]  potassium chloride SA (KLOR-CON) 20 MEQ tablet Take 20 mEq by mouth daily. 08/18/19  Yes [provider]  pravastatin (PRAVACHOL) 20 MG tablet Take 20 mg by mouth. 10/24/15  Yes [provider]  torsemide (DEMADEX) 20 MG tablet Take 20 mg by mouth 2 (two) times daily. 07/22/19  Yes [provider]  vitamin E 400 UNIT capsule Take 400 Units by mouth 3 (three) times daily.    Yes [provider]  zolpidem (AMBIEN) 5 MG tablet Take 5 mg by mouth at bedtime as needed for sleep.   Yes [provider]  aspirin 325 MG tablet Take 325 mg by mouth daily. Patient not taking: Reported on 08/23/2019    [provider]  carbamide peroxide (DEBROX) 6.5 % OTIC solution Place 1 drop into both ears 2 (two) times daily. Patient not taking: Reported on 08/23/2019    [provider]  furosemide (  LASIX) 20 MG tablet Take 20 mg by mouth daily.  Patient not taking: Reported on 08/23/2019    [provider]  oxyCODONE (OXY IR/ROXICODONE) 5 MG immediate release tablet Take 1 tablet (5 mg total) by mouth every 4 (four) hours as needed for breakthrough pain. Patient not taking: Reported on 08/23/2019 07/23/17   Toni Arthurs, NP  raloxifene (EVISTA) 60 MG tablet TAKE ONE (1) TABLET BY MOUTH EVERY DAY Patient not taking: Reported on 08/23/2019 02/22/16   [provider]  sennosides-docusate sodium (SENOKOT-S) 8.6-50 MG tablet Take 2 tablets by mouth 2 (two) times daily. Patient not taking: Reported on 08/23/2019    [provider]    Family History  Problem Relation Age of Onset  . Colon cancer Mother   . Osteoporosis Mother   . Arthritis Mother   . Early death Mother   . Diabetes Father   . Hypertension Father   . Heart attack Father   . Coronary artery disease Father   . Early death Father   . Heart disease Father   . Hyperlipidemia Father   . Depression Brother   . Diabetes Brother   . Mental illness Brother   . Asthma Maternal Grandfather   . COPD Maternal Grandfather   . Asthma Maternal Aunt   . COPD Maternal Aunt   . Cancer Maternal Aunt      Social History   Tobacco Use  . Smoking status: Former Smoker     Types: Cigarettes    Quit date: 02/13/1979    Years since quitting: 40.5  . Smokeless tobacco: Never Used  Vaping Use  . Vaping Use: Never used  Substance Use Topics  . Alcohol use: Yes    Alcohol/week: 14.0 standard drinks    Types: 14 Glasses of wine per week  . Drug use: No    Allergies as of 08/23/2019 - Review Complete 08/23/2019  Allergen Reaction Noted  . Neosporin [neomycin-polymyxin-gramicidin] Hives 01/25/2016  . Tape  12/18/2013  . Codeine Rash 01/25/2016  . Latex Rash 01/25/2016  . Neomycin-bacitracin zn-polymyx Rash 08/21/2012  . Penicillins Rash and Other (See Comments) 01/25/2016    Review of Systems:    All systems reviewed and negative except where noted in HPI.   Physical Exam:  Vital signs in last 24 hours: Temp:  [97.7 F (36.5 C)-103 F (39.4 C)] 97.7 F (36.5 C) (07/12 0500) Pulse Rate:  [27-92] 92 (07/12 0730) Resp:  [11-28] 17 (07/12 0730) BP: (89-131)/(39-92) 122/67 (07/12 0730) SpO2:  [92 %-99 %] 96 % (07/12 0730) Weight:  [90.7 kg] 90.7 kg (07/11 1751)   General:   Pleasant, cooperative in NAD Head:  Normocephalic and atraumatic. Eyes:   No icterus.   Conjunctiva pink. PERRLA. Ears:  Normal auditory acuity. Neck:  Supple; no masses or thyroidomegaly Lungs: Respirations even and unlabored. Lungs clear to auscultation bilaterally.   No wheezes, crackles, or rhonchi.  Heart:  Regular rate and rhythm;  Without murmur, clicks, rubs or gallops Abdomen:  Soft, nondistended, nontender. Normal bowel sounds. No appreciable masses or hepatomegaly.  No rebound or guarding.  Neurologic:  Alert and oriented x3;  grossly normal neurologically. Skin:  Intact without significant lesions or rashes. Cervical Nodes:  No significant cervical adenopathy. Psych:  Alert and cooperative. Normal affect.  LAB RESULTS: Recent Labs    08/23/19 1746 08/24/19 0432  WBC 16.9* 11.6*  HGB 11.6* 9.1*  HCT 33.9* 27.5*  PLT 358 254   BMET Recent Labs  08/23/19 1746 08/24/19 0432  NA 133* 140  K 3.8 3.2*  CL 98 110  CO2 22 26  GLUCOSE 205* 127*  BUN 16 14  CREATININE 0.75 0.72  CALCIUM 8.4* 6.8*   LFT Recent Labs    08/24/19 0432  PROT 6.2*  ALBUMIN 2.3*  AST 68*  ALT 33  ALKPHOS 362*  BILITOT 4.1*   PT/INR Recent Labs    08/23/19 1746  LABPROT 14.7  INR 1.2    STUDIES: CT Head Wo Contrast  Result Date: 08/23/2019 CLINICAL DATA:  Altered level of consciousness, combative, frequent falls EXAM: CT HEAD WITHOUT CONTRAST CT CERVICAL SPINE WITHOUT CONTRAST TECHNIQUE: Multidetector CT imaging of the head and cervical spine was performed following the standard protocol without intravenous contrast. Multiplanar CT image reconstructions of the cervical spine were also generated. COMPARISON:  11/01/2009 FINDINGS: CT HEAD FINDINGS Brain: No acute infarct or hemorrhage. Lateral ventricles and midline structures are unremarkable. There is moderate diffuse cerebral atrophy. No acute extra-axial fluid collections. No mass effect. Vascular: No hyperdense vessel or unexpected calcification. Skull: Normal. Negative for fracture or focal lesion. Sinuses/Orbits: No acute finding. Other: None. CT CERVICAL SPINE FINDINGS Alignment: Alignment is anatomic. Skull base and vertebrae: No acute displaced fractures. Soft tissues and spinal canal: No prevertebral fluid or swelling. No visible canal hematoma. Disc levels: Mild mid cervical facet hypertrophy. No significant spondylosis or compressive sequela. Upper chest: Airway is patent. Lung apices are clear. Other: Reconstructed images demonstrate no additional findings. IMPRESSION: 1. No acute intracranial process. 2. No acute cervical spine fracture. Electronically Signed   By: Randa Ngo M.D.   On: 08/23/2019 18:35   CT Cervical Spine Wo Contrast  Result Date: 08/23/2019 CLINICAL DATA:  Altered level of consciousness, combative, frequent falls EXAM: CT HEAD WITHOUT CONTRAST CT CERVICAL SPINE  WITHOUT CONTRAST TECHNIQUE: Multidetector CT imaging of the head and cervical spine was performed following the standard protocol without intravenous contrast. Multiplanar CT image reconstructions of the cervical spine were also generated. COMPARISON:  11/01/2009 FINDINGS: CT HEAD FINDINGS Brain: No acute infarct or hemorrhage. Lateral ventricles and midline structures are unremarkable. There is moderate diffuse cerebral atrophy. No acute extra-axial fluid collections. No mass effect. Vascular: No hyperdense vessel or unexpected calcification. Skull: Normal. Negative for fracture or focal lesion. Sinuses/Orbits: No acute finding. Other: None. CT CERVICAL SPINE FINDINGS Alignment: Alignment is anatomic. Skull base and vertebrae: No acute displaced fractures. Soft tissues and spinal canal: No prevertebral fluid or swelling. No visible canal hematoma. Disc levels: Mild mid cervical facet hypertrophy. No significant spondylosis or compressive sequela. Upper chest: Airway is patent. Lung apices are clear. Other: Reconstructed images demonstrate no additional findings. IMPRESSION: 1. No acute intracranial process. 2. No acute cervical spine fracture. Electronically Signed   By: Randa Ngo M.D.   On: 08/23/2019 18:35   MR 3D Recon At Scanner  Result Date: 08/23/2019 CLINICAL DATA:  Right upper quadrant pain.  Biliary dilatation. EXAM: MRI ABDOMEN WITHOUT AND WITH CONTRAST (INCLUDING MRCP) TECHNIQUE: Multiplanar multisequence MR imaging of the abdomen was performed both before and after the administration of intravenous contrast. Heavily T2-weighted images of the biliary and pancreatic ducts were obtained, and three-dimensional MRCP images were rendered by post processing. CONTRAST:  7.32m GADAVIST GADOBUTROL 1 MMOL/ML IV SOLN COMPARISON:  Right upper quadrant ultrasound same day FINDINGS: Lower chest: Cardiomegaly Hepatobiliary: There is severe intrahepatic and extrahepatic biliary dilatation. The common bile duct  measures 17 mm. There is a large stone within the distal common  bile duct (series 3, image 15). Status post cholecystectomy. No focal liver lesion. Pancreas: No mass, inflammatory changes, or other parenchymal abnormality identified. Spleen:  Within normal limits in size and appearance. Adrenals/Urinary Tract: No masses identified. No evidence of hydronephrosis. Stomach/Bowel: Visualized portions within the abdomen are unremarkable. Vascular/Lymphatic: No pathologically enlarged lymph nodes identified. No abdominal aortic aneurysm demonstrated. Other:  None. Musculoskeletal: No suspicious bone lesions identified. IMPRESSION: Severe intrahepatic and extrahepatic biliary dilatation with large stone within the distal common bile duct. Electronically Signed   By: Ulyses Jarred M.D.   On: 08/23/2019 22:49   DG Chest Port 1 View  Result Date: 08/23/2019 CLINICAL DATA:  Fever, altered level of consciousness EXAM: PORTABLE CHEST 1 VIEW COMPARISON:  03/12/2018 FINDINGS: Single frontal view of the chest demonstrates an unremarkable cardiac silhouette. There is increased central vascular congestion, with mild diffuse interstitial prominence. No focal consolidation, effusion, or pneumothorax. No acute bony abnormalities. IMPRESSION: 1. Increased central vascular congestion. 2. No acute airspace disease. Electronically Signed   By: Randa Ngo M.D.   On: 08/23/2019 18:15   MR ABDOMEN MRCP W WO CONTAST  Result Date: 08/23/2019 CLINICAL DATA:  Right upper quadrant pain.  Biliary dilatation. EXAM: MRI ABDOMEN WITHOUT AND WITH CONTRAST (INCLUDING MRCP) TECHNIQUE: Multiplanar multisequence MR imaging of the abdomen was performed both before and after the administration of intravenous contrast. Heavily T2-weighted images of the biliary and pancreatic ducts were obtained, and three-dimensional MRCP images were rendered by post processing. CONTRAST:  7.32m GADAVIST GADOBUTROL 1 MMOL/ML IV SOLN COMPARISON:  Right upper  quadrant ultrasound same day FINDINGS: Lower chest: Cardiomegaly Hepatobiliary: There is severe intrahepatic and extrahepatic biliary dilatation. The common bile duct measures 17 mm. There is a large stone within the distal common bile duct (series 3, image 15). Status post cholecystectomy. No focal liver lesion. Pancreas: No mass, inflammatory changes, or other parenchymal abnormality identified. Spleen:  Within normal limits in size and appearance. Adrenals/Urinary Tract: No masses identified. No evidence of hydronephrosis. Stomach/Bowel: Visualized portions within the abdomen are unremarkable. Vascular/Lymphatic: No pathologically enlarged lymph nodes identified. No abdominal aortic aneurysm demonstrated. Other:  None. Musculoskeletal: No suspicious bone lesions identified. IMPRESSION: Severe intrahepatic and extrahepatic biliary dilatation with large stone within the distal common bile duct. Electronically Signed   By: KUlyses JarredM.D.   On: 08/23/2019 22:49   [place] LIMITED RUQ  Result Date: 08/23/2019 CLINICAL DATA:  Pain.  Fever and sepsis with elevated LFTs. EXAM: ULTRASOUND ABDOMEN LIMITED RIGHT UPPER QUADRANT COMPARISON:  None. FINDINGS: Gallbladder: The patient is status post prior cholecystectomy. Common bile duct: Diameter: The common bile duct is significantly dilated measuring approximately 1.5 cm proximally tapering to approximately 0.9 cm distally. Liver: There is significant intrahepatic biliary ductal dilatation. Portal vein is patent on color Doppler imaging with normal direction of blood flow towards the liver. Other: None. IMPRESSION: 1. Status post cholecystectomy. 2. There is significant intrahepatic and extrahepatic biliary ductal dilatation raising concern for an underlying obstructing process. Follow-up with MRCP/ERCP is recommended. Electronically Signed   By: CConstance HolsterM.D.   On: 08/23/2019 19:50      Impression / Plan:   KLachrisha Ziebarthis a 74y.o. y/o female  with history of cholecystectomy , presented to the ER with altered mental status, nausea, vomiting and abdominal pain, T bilirubin 4.1, CBD 17 mm and stone seen on MRCP in distal CBD suggestive of acute cholangitis -severe.  Been on Eloquis last dose taken on Sunday morning.. Discussed with  Dr Allen Norris , For ERCP tomorrow .   Plan 1. NPO from midnight , continue antibiotics, hold Eliquis 2. ERCP tomorrow with Dr Allen Norris   I have discussed alternative options, risks & benefits,  which include, but are not limited to, bleeding, infection, perforation,respiratory complication, drug reaction, pancreatitis and death.  The patient agrees with this plan & written consent will be obtained.     Thank you for involving me in the care of this patient.      LOS: 1 day   Jonathon Bellows, MD  08/24/2019, 8:39 AM

## 2019-08-24 NOTE — ED Notes (Signed)
Pt visualized sleeping, NAD noted.

## 2019-08-24 NOTE — Progress Notes (Signed)
Pharmacy Antibiotic Note  Bethany Joseph is a 74 y.o. female admitted on 08/23/2019 with sepsis.  Pharmacy has been consulted for Vancomycin and Aztreonam dosing.  Plan: Aztreonam 1gm IV q8hrs  Vancomycin 1250 mg IV Q 24 hrs. Goal AUC 400-550. Expected AUC: 498.7, Css min 10.8 SCr used: 0.8   Height: 5\' 8"  (172.7 cm) Weight: 90.7 kg (200 lb) IBW/kg (Calculated) : 63.9  Temp (24hrs), Avg:100.4 F (38 C), Min:97.7 F (36.5 C), Max:103 F (39.4 C)  Recent Labs  Lab 08/23/19 1746 08/23/19 2023 08/24/19 0432  WBC 16.9*  --  11.6*  CREATININE 0.75  --  0.72  LATICACIDVEN 2.4* 1.1  --     Estimated Creatinine Clearance: 71.6 mL/min (by C-G formula based on SCr of 0.72 mg/dL).    Allergies  Allergen Reactions  . Neosporin [Neomycin-Polymyxin-Gramicidin] Hives  . Tape     Other reaction(s): Unknown Adhesive bandage  . Codeine Rash  . Latex Rash  . Neomycin-Bacitracin Zn-Polymyx Rash    Other reaction(s): Unknown DERMATOLOGICALS   . Penicillins Rash and Other (See Comments)    Has patient had a PCN reaction causing immediate rash, facial/tongue/throat swelling, SOB or lightheadedness with hypotension: Unknown Has patient had a PCN reaction causing severe rash involving mucus membranes or skin necrosis: Unknown Has patient had a PCN reaction that required hospitalization: Unknown Has patient had a PCN reaction occurring within the last 10 years: No If all of the above answers are "NO", then may proceed with Cephalosporin use.     Antimicrobials this admission:   >>    >>   Dose adjustments this admission:   Microbiology results:  BCx:   UCx:    Sputum:    MRSA PCR:   Thank you for allowing pharmacy to be a part of this patient's care.  10/25/19 A 08/24/2019 7:27 AM

## 2019-08-24 NOTE — ED Notes (Signed)
Pt urinated in bedpan, pt clean and dry. Pt repositioned by this RN and Nurse, children's. NAD noted, VSS.  Pt's husband left for the night, requested to be called by her with updates on room assignment.

## 2019-08-25 ENCOUNTER — Inpatient Hospital Stay: Payer: Medicare Other | Admitting: Anesthesiology

## 2019-08-25 ENCOUNTER — Encounter: Payer: Self-pay | Admitting: *Deleted

## 2019-08-25 ENCOUNTER — Encounter
Admission: EM | Disposition: A | Discharge status: Home / Self Care | Payer: Self-pay | Source: Home / Self Care | Attending: Internal Medicine

## 2019-08-25 ENCOUNTER — Inpatient Hospital Stay: Payer: Medicare Other

## 2019-08-25 DIAGNOSIS — K8050 Calculus of bile duct without cholangitis or cholecystitis without obstruction: Secondary | ICD-10-CM

## 2019-08-25 DIAGNOSIS — K7200 Acute and subacute hepatic failure without coma: Secondary | ICD-10-CM

## 2019-08-25 DIAGNOSIS — K8309 Other cholangitis: Secondary | ICD-10-CM

## 2019-08-25 DIAGNOSIS — I4891 Unspecified atrial fibrillation: Secondary | ICD-10-CM

## 2019-08-25 DIAGNOSIS — K222 Esophageal obstruction: Secondary | ICD-10-CM

## 2019-08-25 HISTORY — PX: ENDOSCOPIC RETROGRADE CHOLANGIOPANCREATOGRAPHY (ERCP) WITH PROPOFOL: SHX5810

## 2019-08-25 LAB — COMPREHENSIVE METABOLIC PANEL
ALT: 29 U/L (ref 0–44)
AST: 40 U/L (ref 15–41)
Albumin: 2.5 g/dL — ABNORMAL LOW (ref 3.5–5.0)
Alkaline Phosphatase: 356 U/L — ABNORMAL HIGH (ref 38–126)
Anion gap: 5 (ref 5–15)
BUN: 9 mg/dL (ref 8–23)
CO2: 24 mmol/L (ref 22–32)
Calcium: 8.5 mg/dL — ABNORMAL LOW (ref 8.9–10.3)
Chloride: 109 mmol/L (ref 98–111)
Creatinine, Ser: 0.57 mg/dL (ref 0.44–1.00)
GFR calc Af Amer: >60 mL/min (ref >60)
GFR calc non Af Amer: >60 mL/min (ref >60)
Glucose, Bld: 113 mg/dL — ABNORMAL HIGH (ref 70–99)
Potassium: 4.3 mmol/L (ref 3.5–5.1)
Sodium: 138 mmol/L (ref 135–145)
Total Bilirubin: 3.3 mg/dL — ABNORMAL HIGH (ref 0.3–1.2)
Total Protein: 6.5 g/dL (ref 6.5–8.1)

## 2019-08-25 LAB — GLUCOSE, CAPILLARY
Glucose-Capillary: 103 mg/dL — ABNORMAL HIGH (ref 70–99)
Glucose-Capillary: 104 mg/dL — ABNORMAL HIGH (ref 70–99)
Glucose-Capillary: 107 mg/dL — ABNORMAL HIGH (ref 70–99)
Glucose-Capillary: 108 mg/dL — ABNORMAL HIGH (ref 70–99)
Glucose-Capillary: 136 mg/dL — ABNORMAL HIGH (ref 70–99)
Glucose-Capillary: 159 mg/dL — ABNORMAL HIGH (ref 70–99)
Glucose-Capillary: 192 mg/dL — ABNORMAL HIGH (ref 70–99)
Glucose-Capillary: 78 mg/dL (ref 70–99)

## 2019-08-25 LAB — CBC
HCT: 29.1 % — ABNORMAL LOW (ref 36.0–46.0)
Hemoglobin: 9.3 g/dL — ABNORMAL LOW (ref 12.0–15.0)
MCH: 29.5 pg (ref 26.0–34.0)
MCHC: 32 g/dL (ref 30.0–36.0)
MCV: 92.4 fL (ref 80.0–100.0)
Platelets: 260 10*3/uL (ref 150–400)
RBC: 3.15 MIL/uL — ABNORMAL LOW (ref 3.87–5.11)
RDW: 17.2 % — ABNORMAL HIGH (ref 11.5–15.5)
WBC: 8 10*3/uL (ref 4.0–10.5)
nRBC: 0 % (ref 0.0–0.2)

## 2019-08-25 LAB — HEMOGLOBIN A1C
Hgb A1c MFr Bld: 7.3 % — ABNORMAL HIGH (ref 4.8–5.6)
Mean Plasma Glucose: 163 mg/dL

## 2019-08-25 LAB — MAGNESIUM: Magnesium: 1.9 mg/dL (ref 1.7–2.4)

## 2019-08-25 LAB — PROCALCITONIN: Procalcitonin: 0.84 ng/mL

## 2019-08-25 SURGERY — ENDOSCOPIC RETROGRADE CHOLANGIOPANCREATOGRAPHY (ERCP) WITH PROPOFOL
Anesthesia: General

## 2019-08-25 MED ORDER — ONDANSETRON HCL 4 MG/2ML IJ SOLN
INTRAMUSCULAR | Status: AC
  Filled 2019-08-25: qty 2

## 2019-08-25 MED ORDER — INDOMETHACIN 50 MG RE SUPP
100.0000 mg | Freq: Once | RECTAL | Status: DC
  Filled 2019-08-25: qty 2

## 2019-08-25 MED ORDER — PROPOFOL 500 MG/50ML IV EMUL
INTRAVENOUS | Status: AC
  Filled 2019-08-25: qty 50

## 2019-08-25 MED ORDER — GLYCOPYRROLATE 0.2 MG/ML IJ SOLN
INTRAMUSCULAR | Status: AC
  Filled 2019-08-25: qty 1

## 2019-08-25 MED ORDER — SUCCINYLCHOLINE CHLORIDE 200 MG/10ML IV SOSY
PREFILLED_SYRINGE | INTRAVENOUS | Status: AC
  Filled 2019-08-25: qty 10

## 2019-08-25 MED ORDER — DEXAMETHASONE SODIUM PHOSPHATE 10 MG/ML IJ SOLN
INTRAMUSCULAR | Status: AC
  Filled 2019-08-25: qty 1

## 2019-08-25 MED ORDER — LIDOCAINE HCL (PF) 2 % IJ SOLN
INTRAMUSCULAR | Status: AC
  Filled 2019-08-25: qty 5

## 2019-08-25 MED ORDER — PROPOFOL 10 MG/ML IV BOLUS
INTRAVENOUS | Status: DC | PRN
  Administered 2019-08-25: 120 mg via INTRAVENOUS

## 2019-08-25 MED ORDER — ONDANSETRON HCL 4 MG/2ML IJ SOLN: 4.0000 mg | Freq: Once | INTRAMUSCULAR | Status: DC | PRN

## 2019-08-25 MED ORDER — INDOMETHACIN 50 MG RE SUPP
RECTAL | Status: AC
  Filled 2019-08-25: qty 2

## 2019-08-25 MED ORDER — LACTATED RINGERS IV SOLN: INTRAVENOUS | Status: DC | PRN

## 2019-08-25 MED ORDER — MAGNESIUM SULFATE 2 GM/50ML IV SOLN
2.0000 g | Freq: Once | INTRAVENOUS | Status: AC
  Administered 2019-08-25: 2 g via INTRAVENOUS
  Filled 2019-08-25: qty 50

## 2019-08-25 MED ORDER — DEXAMETHASONE SODIUM PHOSPHATE 10 MG/ML IJ SOLN
INTRAMUSCULAR | Status: DC | PRN
  Administered 2019-08-25: 5 mg via INTRAVENOUS

## 2019-08-25 MED ORDER — FENTANYL CITRATE (PF) 100 MCG/2ML IJ SOLN
INTRAMUSCULAR | Status: DC | PRN
  Administered 2019-08-25: 50 ug via INTRAVENOUS

## 2019-08-25 MED ORDER — GLYCOPYRROLATE 0.2 MG/ML IJ SOLN
INTRAMUSCULAR | Status: DC | PRN
  Administered 2019-08-25: .2 mg via INTRAVENOUS

## 2019-08-25 MED ORDER — OXYCODONE HCL 5 MG PO TABS: 5.0000 mg | ORAL_TABLET | Freq: Once | ORAL | Status: DC | PRN

## 2019-08-25 MED ORDER — LIDOCAINE HCL (CARDIAC) PF 100 MG/5ML IV SOSY
PREFILLED_SYRINGE | INTRAVENOUS | Status: DC | PRN
  Administered 2019-08-25: 60 mg via INTRAVENOUS

## 2019-08-25 MED ORDER — PROPOFOL 500 MG/50ML IV EMUL
INTRAVENOUS | Status: DC | PRN
Start: 2019-08-25 — End: 2019-08-25
  Administered 2019-08-25: 50 ug/kg/min via INTRAVENOUS

## 2019-08-25 MED ORDER — INDOMETHACIN 50 MG RE SUPP
RECTAL | Status: DC | PRN
  Administered 2019-08-25: 100 mg via RECTAL

## 2019-08-25 MED ORDER — LACTATED RINGERS IV SOLN: INTRAVENOUS | Status: DC

## 2019-08-25 MED ORDER — ONDANSETRON HCL 4 MG/2ML IJ SOLN
INTRAMUSCULAR | Status: DC | PRN
  Administered 2019-08-25: 4 mg via INTRAVENOUS

## 2019-08-25 MED ORDER — OXYCODONE HCL 5 MG/5ML PO SOLN: 5.0000 mg | Freq: Once | ORAL | Status: DC | PRN

## 2019-08-25 MED ORDER — FENTANYL CITRATE (PF) 100 MCG/2ML IJ SOLN
INTRAMUSCULAR | Status: AC
  Filled 2019-08-25: qty 2

## 2019-08-25 MED ORDER — FENTANYL CITRATE (PF) 100 MCG/2ML IJ SOLN: 25.0000 ug | INTRAMUSCULAR | Status: DC | PRN

## 2019-08-25 MED ORDER — SUCCINYLCHOLINE CHLORIDE 20 MG/ML IJ SOLN
INTRAMUSCULAR | Status: DC | PRN
  Administered 2019-08-25: 100 mg via INTRAVENOUS

## 2019-08-25 MED ORDER — SODIUM CHLORIDE 0.9 % IV SOLN
INTRAVENOUS | Status: DC
  Administered 2019-08-25: 1000 mL via INTRAVENOUS

## 2019-08-25 MED ORDER — SODIUM CHLORIDE 0.9 % IV SOLN
2.0000 g | INTRAVENOUS | Status: DC
  Administered 2019-08-26: 2 g via INTRAVENOUS
  Filled 2019-08-25: qty 2
  Filled 2019-08-25 (×2): qty 20

## 2019-08-25 MED ORDER — ATROPINE SULFATE 1 MG/ML IJ SOLN
1.0000 mg | INTRAMUSCULAR | Status: DC | PRN
  Filled 2019-08-25: qty 1

## 2019-08-25 MED ORDER — ATROPINE SULFATE 1 MG/10ML IJ SOSY
PREFILLED_SYRINGE | INTRAMUSCULAR | Status: AC
  Administered 2019-08-25: 1 mg
  Filled 2019-08-25: qty 10

## 2019-08-25 NOTE — Op Note (Signed)
[facility] Gastroenterology Patient Name: Bethany SpinnerKarla Joseph Procedure Date: 08/25/2019 4:09 PM MRN: [id number] Account #: [id number] Date of Birth: 05/02/1945 Admit Type: Inpatient Age: 89 [facility] Gender: Female Note Status: Finalized Procedure:             ERCP Indications:           Bile duct stone(s), Suspected ascending cholangitis Providers:             Midge Miniumarren Morgen Linebaugh MD, MD Medicines:             General Anesthesia Complications:         No immediate complications. Procedure:             Pre-Anesthesia Assessment:                        - Prior to the procedure, a History and Physical was                         performed, and patient medications and allergies were                         reviewed. The patient's tolerance of previous                         anesthesia was also reviewed. The risks and benefits                         of the procedure and the sedation options and risks                         were discussed with the patient. All questions were                         answered, and informed consent was obtained. Prior                         Anticoagulants: The patient has taken Eliquis                         (apixaban), last dose was 2 days prior to procedure.                         ASA Grade Assessment: III - A patient with severe                         systemic disease. After reviewing the risks and                         benefits, the patient was deemed in satisfactory                         condition to undergo the procedure.                        After obtaining informed consent, the scope was passed                         under direct vision. Throughout the procedure, the  patient's blood pressure, pulse, and oxygen                         saturations were monitored continuously. The was                         introduced through the mouth, and used to inject                         contrast into  and used to cannulate the bile duct. The                         ERCP was accomplished without difficulty. The patient                         tolerated the procedure well. Findings:      A scout film of the abdomen was obtained. Surgical clips, consistent       with a previous cholecystectomy, were seen in the area of the right       upper quadrant of the abdomen. A standard esophagogastroduodenoscopy       scope was used for the examination of the upper gastrointestinal tract.       The scope was passed under direct vision through the upper GI tract. One       benign-appearing, intrinsic moderate stenosis was found at the       gastroesophageal junction. The stenosis was traversed after dilation. A       TTS dilator was passed through the scope. Dilation with a 15-16.5-18 mm       balloon dilator was performed to 18 mm at the gastroesophageal junction.       The esophagus was successfully intubated under direct vision. The scope       was advanced to a normal major papilla in the descending duodenum       without detailed examination of the pharynx, larynx and associated       structures, and upper GI tract. The upper GI tract was grossly normal.       The bile duct was deeply cannulated with the short-nosed traction       sphincterotome. Contrast was injected. I personally interpreted the bile       duct images. There was brisk flow of contrast through the ducts. Image       quality was excellent. Contrast extended to the entire biliary tree. The       lower third of the main bile duct and middle third of the main bile duct       contained multiple stones. The left and right hepatic ducts and all       intrahepatic branches were diffusely dilated. A wire was passed into the       biliary tree. Biliary sphincterotomy was made with a traction (standard)       sphincterotome using ERBE electrocautery. There was no       post-sphincterotomy bleeding. The biliary tree was swept with a 15 mm        balloon starting at the bifurcation. Sludge was swept from the duct.       Many stones were removed. No stones remained. Impression:            - Benign-appearing esophageal stenosis.                        -  Dilation performed at the gastroesophageal junction.                        - The left and right hepatic ducts and all                         intrahepatic branches were dilated.                        - Choledocholithiasis was found. Complete removal was                         accomplished by biliary sphincterotomy and balloon                         extraction.                        - A biliary sphincterotomy was performed.                        - The biliary tree was swept. Recommendation:        - Clear liquid diet today.                        - Watch for pancreatitis, bleeding, perforation, and                         cholangitis. Procedure Code(s):     --- Professional ---                        [phone], Endoscopic retrograde cholangiopancreatography                         (ERCP); with removal of calculi/debris from                         biliary/pancreatic duct(s)                        43262, Endoscopic retrograde cholangiopancreatography                         (ERCP); with sphincterotomy/papillotomy                        43249, Esophagogastroduodenoscopy, flexible,                         transoral; with transendoscopic balloon dilation of                         esophagus (less than 30 mm diameter)                        74328, Endoscopic catheterization of the biliary                         ductal system, radiological supervision and                         interpretation Diagnosis Code(s):     --- Professional ---  K80.50, Calculus of bile duct without cholangitis or                         cholecystitis without obstruction                        K22.2, Esophageal obstruction                        K83.8, Other specified diseases of  biliary tract CPT copyright 2019 [employer]. All rights reserved. The codes documented in this report are preliminary and upon coder review may  be revised to meet current compliance requirements. Midge Minium MD, MD 08/25/2019 5:15:48 PM This report has been signed electronically. Number of Addenda: 0 Note Initiated On: 08/25/2019 4:09 PM Estimated Blood Loss:  Estimated blood loss: none.      [facility]

## 2019-08-25 NOTE — Progress Notes (Signed)
PROGRESS NOTE    Bethany Joseph    Code Status: Full Code  MBT:[id number] DOB: Dec 31, 1945 DOA: 08/23/2019 LOS: 2 days  PCP: Guadalupe Maple, MD CC:  Chief Complaint  Patient presents with  . Altered Mental Status       Hospital Summary   This is a 74 year old female with past medical history of type 2 diabetes, paroxysmal tachycardia and chronic atrial fibrillation, COPD, hypothyroidism, hyperlipidemia, hypertension, valvular disorder who presented to the ER on 7/11 with altered mental status, nausea and vomiting and abdominal pain.  ED course: Vital signs were temp 103 F, bp 131/73, hr 86 bpm, and O2 sats 92% on RA.  Lab results revealed Na+ 133, glucose 205, calcium 8.4, alk phos 480, albumin 3.2, AST 72, total bilirubin 4.1, troponin 26, lactic acid 2.4, wbc 16.9, hgb 11.6, and COVID-19 negative. [place] Abd limited revealed significant intrahepatic and extrahepatic biliary ductal dilatation raising concern for an underlying obstructing process.  GI was consulted for MRCP/ERCP.  She was admitted to the ICU under the PCCM service for sepsis secondary to cholangitis and started on broad-spectrum antibiotics.  Cardiology was consulted for preop clearance  Overnight 7/13 patient had asymptomatic A. fib with bradycardia and long pauses and was given atropine x1.   A & P   Active Problems:   Sepsis ([place])   1. Severe sepsis secondary to cholangitis a. On admission: Lactic acidosis, leukocytosis, fever elevated LFTs with altered mental status. sepsis currently resolved b. Plan for ERCP today c. Cultures pending d. Holding outpatient antihypertensives e. Change antibiotics to ceftriaxone and Flagyl after pharmacy discussion with patient regarding questionable allergy to penicillins.  Monitor for anaphylaxis  2. Asymptomatic atrial fibrillation with SVR and pauses, history of chronic atrial fibrillation a. Cardiology consulted for preop clearance on 7/13 recommended holding Eliquis,  digoxin and Bystolic b. EKG 5/36 with T wave inversions in anterior leads and diffuse ST depressions in inferolateral leads  c. EKG today with much more pronounced T wave inversions in anterior leads and improved depressions in the inferolateral leads d. Bradycardia with HR mid to high 20s -50s overnight with pauses.  Got atropine x1. Cardiology notified this a.m., appreciate recommendations  3. Asthma, stable a. As needed bronchodilators  4. GERD on Pepcid  5. Type 2 diabetes a. Continue sliding scale  6. Hypokalemia  hypomagnesemia a. Repleted   DVT prophylaxis: SCDs Start: 08/23/19 2049   Family Communication: Patient's husband at bedside has been updated   Disposition Plan:  Status is: Inpatient  Remains inpatient appropriate because:Ongoing diagnostic testing needed not appropriate for outpatient work up, IV treatments appropriate due to intensity of illness or inability to take PO and Inpatient level of care appropriate due to severity of illness   Dispo: The patient is from: Home              Anticipated d/c is to: Home              Anticipated d/c date is: > 3 days              Patient currently is not medically stable to d/c.          Pressure injury documentation    None  Consultants  GI Cardiology PCCM   Procedures  None  Antibiotics   Anti-infectives (From admission, onward)   Start     Dose/Rate Route Frequency Ordered Stop   08/25/19 1800  cefTRIAXone (ROCEPHIN) 2 g in sodium chloride 0.9 % 100 mL IVPB  Discontinue     2 g 200 mL/hr over 30 Minutes Intravenous Every 24 hours 08/25/19 1303     08/24/19 2030  aztreonam (AZACTAM) 1 g in sodium chloride 0.9 % 100 mL IVPB  Status:  Discontinued        1 g 200 mL/hr over 30 Minutes Intravenous Every 8 hours 08/24/19 1813 08/25/19 1303   08/24/19 2000  metroNIDAZOLE (FLAGYL) IVPB 500 mg     Discontinue     500 mg 100 mL/hr over 60 Minutes Intravenous Every 8 hours 08/24/19 1826     08/24/19  1600  vancomycin (VANCOREADY) IVPB 1250 mg/250 mL  Status:  Discontinued        1,250 mg 166.7 mL/hr over 90 Minutes Intravenous Every 24 hours 08/24/19 0732 08/25/19 1303   08/24/19 0900  vancomycin (VANCOREADY) IVPB 750 mg/150 mL  Status:  Discontinued        750 mg 150 mL/hr over 60 Minutes Intravenous Every 12 hours 08/24/19 0727 08/24/19 0730   08/24/19 0800  aztreonam (AZACTAM) 1 g in sodium chloride 0.9 % 100 mL IVPB  Status:  Discontinued        1 g 200 mL/hr over 30 Minutes Intravenous Every 8 hours 08/24/19 0724 08/24/19 1813   08/24/19 0700  metroNIDAZOLE (FLAGYL) IVPB 500 mg  Status:  Discontinued        500 mg 100 mL/hr over 60 Minutes Intravenous Every 8 hours 08/23/19 2203 08/24/19 1826   08/23/19 1830  aztreonam (AZACTAM) 2 g in sodium chloride 0.9 % 100 mL IVPB        2 g 200 mL/hr over 30 Minutes Intravenous  Once 08/23/19 1818 08/23/19 2023   08/23/19 1830  metroNIDAZOLE (FLAGYL) IVPB 500 mg        500 mg 100 mL/hr over 60 Minutes Intravenous  Once 08/23/19 1818 08/23/19 2023   08/23/19 1830  vancomycin (VANCOCIN) IVPB 1000 mg/200 mL premix        1,000 mg 200 mL/hr over 60 Minutes Intravenous  Once 08/23/19 1818 08/23/19 2122        Subjective   Overnight patient noted to have long pauses, bedside nurse reported up to 4 seconds, HR as low as 2850 and asymptomatic.  Given atropine IV x1.  Currently, patient states she feels a bit better.  Denies any chest pain or palpitations, nausea or vomiting no other issues.  Objective   Vitals:   08/25/19 1100 08/25/19 1200 08/25/19 1300 08/25/19 1400  BP: (!) 119/52 (!) 128/94 131/78 (!) 155/77  Pulse: (!) 43 (!) 50 (!) 48 (!) 44  Resp: _0 Temp:  97.9 F (36.6 C)    TempSrc:  Oral    SpO2: 97% 100% 97% 98%  Weight:      Height:        Intake/Output Summary (Last 24 hours) at 08/25/2019 1458 Last data filed at 08/25/2019 0524 Gross per 24 hour  Intake 1652.4 ml  Output 125 ml  Net 1527.4 ml   Filed  Weights   08/23/19 1751  Weight: 90.7 kg    Examination:  Physical Exam Vitals and nursing note reviewed. Exam conducted with a chaperone present.  Constitutional:      Appearance: Normal appearance.  HENT:     Head: Normocephalic and atraumatic.  Eyes:     Conjunctiva/sclera: Conjunctivae normal.  Cardiovascular:     Rate and Rhythm: Bradycardia present. Rhythm irregular.  Pulmonary:     Effort: Pulmonary effort is  normal.     Breath sounds: Normal breath sounds.  Abdominal:     General: Abdomen is flat. There is no distension.     Palpations: Abdomen is soft.     Tenderness: There is no abdominal tenderness.  Musculoskeletal:        General: No swelling or tenderness.  Skin:    Coloration: Skin is not jaundiced or pale.  Neurological:     Mental Status: She is alert. Mental status is at baseline.  Psychiatric:        Mood and Affect: Mood normal.        Behavior: Behavior normal.     Data Reviewed: I have personally reviewed following labs and imaging studies  CBC: Recent Labs  Lab 08/23/19 1746 08/24/19 0432 08/25/19 0448  WBC 16.9* 11.6* 8.0  NEUTROABS 15.2* 9.4*  --   HGB 11.6* 9.1* 9.3*  HCT 33.9* 27.5* 29.1*  MCV 87.4 90.8 92.4  PLT 358 254 500   Basic Metabolic Panel: Recent Labs  Lab 08/23/19 1746 08/23/19 2023 08/24/19 0432 08/25/19 0447  NA 133*  --  140 138  K 3.8  --  3.2* 4.3  CL 98  --  110 109  CO2 22  --  26 24  GLUCOSE 205*  --  127* 113*  BUN 16  --  14 9  CREATININE 0.75  --  0.72 0.57  CALCIUM 8.4*  --  6.8* 8.5*  MG  --  0.7* 2.5* 1.9  PHOS  --  2.7 3.9  --    GFR: Estimated Creatinine Clearance: 71.6 mL/min (by C-G formula based on SCr of 0.57 mg/dL). Liver Function Tests: Recent Labs  Lab 08/23/19 1746 08/24/19 0432 08/25/19 0447  AST 72* 68* 40  ALT 37 33 29  ALKPHOS 480* 362* 356*  BILITOT 4.1* 4.1* 3.3*  PROT 8.3* 6.2* 6.5  ALBUMIN 3.2* 2.3* 2.5*   Recent Labs  Lab 08/23/19 2000  LIPASE 19   No results  for input(s): AMMONIA in the last 168 hours. Coagulation Profile: Recent Labs  Lab 08/23/19 1746  INR 1.2   Cardiac Enzymes: No results for input(s): CKTOTAL, CKMB, CKMBINDEX, TROPONINI in the last 168 hours. BNP (last 3 results) No results for input(s): PROBNP in the last 8760 hours. HbA1C: Recent Labs    08/24/19 0432  HGBA1C 7.3*   CBG: Recent Labs  Lab 08/24/19 1933 08/25/19 0007 08/25/19 0405 08/25/19 0737 08/25/19 1111  GLUCAP 212* 78 107* 103* 108*   Lipid Profile: No results for input(s): CHOL, HDL, LDLCALC, TRIG, CHOLHDL, LDLDIRECT in the last 72 hours. Thyroid Function Tests: No results for input(s): TSH, T4TOTAL, FREET4, T3FREE, THYROIDAB in the last 72 hours. Anemia Panel: No results for input(s): VITAMINB12, FOLATE, FERRITIN, TIBC, IRON, RETICCTPCT in the last 72 hours. Sepsis Labs: Recent Labs  Lab 08/23/19 1746 08/23/19 2023 08/24/19 0432 08/25/19 0447  PROCALCITON  --  1.39 2.37 0.84  LATICACIDVEN 2.4* 1.1  --   --     Recent Results (from the past 240 hour(s))  Blood Culture (routine x 2)     Status: None (Preliminary result)   Collection Time: 08/23/19  5:46 PM   Specimen: BLOOD  Result Value Ref Range Status   Specimen Description BLOOD BLOOD LEFT FOREARM  Final   Special Requests   Final    BOTTLES DRAWN AEROBIC AND ANAEROBIC Blood Culture adequate volume   Culture   Final    NO GROWTH 2 DAYS Performed at [facility]  Lab, [place], [place] [address]    Report Status PENDING  Incomplete  Blood Culture (routine x 2)     Status: None (Preliminary result)   Collection Time: 08/23/19  5:46 PM   Specimen: BLOOD  Result Value Ref Range Status   Specimen Description BLOOD LEFT ANTECUBITAL  Final   Special Requests   Final    BOTTLES DRAWN AEROBIC AND ANAEROBIC Blood Culture adequate volume   Culture   Final    NO GROWTH 2 DAYS Performed at [facility], [address] [place], [place] [address]    Report  Status PENDING  Incomplete  SARS Coronavirus 2 by RT PCR (hospital order, performed in [place] hospital lab) Nasopharyngeal Nasopharyngeal Swab     Status: None   Collection Time: 08/23/19  5:46 PM   Specimen: Nasopharyngeal Swab  Result Value Ref Range Status   SARS Coronavirus 2 NEGATIVE NEGATIVE Final    Comment: (NOTE) SARS-CoV-2 target nucleic acids are NOT DETECTED.  The SARS-CoV-2 RNA is generally detectable in upper and lower respiratory specimens during the acute phase of infection. The lowest concentration of SARS-CoV-2 viral copies this assay can detect is 250 copies / mL. A negative result does not preclude SARS-CoV-2 infection and should not be used as the sole basis for treatment or other patient management decisions.  A negative result may occur with improper specimen collection / handling, submission of specimen other than nasopharyngeal swab, presence of viral mutation(s) within the areas targeted by this assay, and inadequate number of viral copies (<250 copies / mL). A negative result must be combined with clinical observations, patient history, and epidemiological information.  Fact Sheet for Patients:   StrictlyIdeas.no  Fact Sheet for Healthcare Providers: BankingDealers.co.za  This test is not yet approved or  cleared by the [place] FDA and has been authorized for detection and/or diagnosis of SARS-CoV-2 by FDA under an Emergency Use Authorization (EUA).  This EUA will remain in effect (meaning this test can be used) for the duration of the COVID-19 declaration under Section 564(b)(1) of the Act, 21 U.S.C. section 360bbb-3(b)(1), unless the authorization is terminated or revoked sooner.  Performed at [facility], [address] [place], [place] [address]   Urine culture     Status: Abnormal (Preliminary result)   Collection Time: 08/23/19  8:23 PM   Specimen: Urine, Random  Result Value  Ref Range Status   Specimen Description   Final    URINE, RANDOM Performed at [facility], [address] [place], [place] [address]    Special Requests   Final    NONE Performed at [facility], [address] [place], [place] [address]    Culture (A)  Final    40,000 COLONIES/mL ENTEROCOCCUS FAECALIS SUSCEPTIBILITIES TO FOLLOW Performed at [facility], [place] [address], [place], [place] [address]    Report Status PENDING  Incomplete  MRSA PCR Screening     Status: None   Collection Time: 08/24/19  4:00 PM   Specimen: Nasal Mucosa; Nasopharyngeal  Result Value Ref Range Status   MRSA by PCR NEGATIVE NEGATIVE Final    Comment:        The GeneXpert MRSA Assay (FDA approved for NASAL specimens only), is one component of a comprehensive MRSA colonization surveillance program. It is not intended to diagnose MRSA infection nor to guide or monitor treatment for MRSA infections. Performed at [facility], [place], [place]  [address]          Radiology Studies: CT Head Wo Contrast  Result Date: 08/23/2019 CLINICAL DATA:  Altered level of consciousness, combative, frequent falls EXAM: CT HEAD WITHOUT CONTRAST CT CERVICAL SPINE WITHOUT CONTRAST TECHNIQUE: Multidetector CT imaging of the head and cervical spine was performed following the standard protocol without intravenous contrast. Multiplanar CT image reconstructions of the cervical spine were also generated. COMPARISON:  11/01/2009 FINDINGS: CT HEAD FINDINGS Brain: No acute infarct or hemorrhage. Lateral ventricles and midline structures are unremarkable. There is moderate diffuse cerebral atrophy. No acute extra-axial fluid collections. No mass effect. Vascular: No hyperdense vessel or unexpected calcification. Skull: Normal. Negative for fracture or focal lesion. Sinuses/Orbits: No acute finding. Other: None. CT CERVICAL SPINE FINDINGS Alignment: Alignment is anatomic. Skull  base and vertebrae: No acute displaced fractures. Soft tissues and spinal canal: No prevertebral fluid or swelling. No visible canal hematoma. Disc levels: Mild mid cervical facet hypertrophy. No significant spondylosis or compressive sequela. Upper chest: Airway is patent. Lung apices are clear. Other: Reconstructed images demonstrate no additional findings. IMPRESSION: 1. No acute intracranial process. 2. No acute cervical spine fracture. Electronically Signed   By: Randa Ngo M.D.   On: 08/23/2019 18:35   CT Cervical Spine Wo Contrast  Result Date: 08/23/2019 CLINICAL DATA:  Altered level of consciousness, combative, frequent falls EXAM: CT HEAD WITHOUT CONTRAST CT CERVICAL SPINE WITHOUT CONTRAST TECHNIQUE: Multidetector CT imaging of the head and cervical spine was performed following the standard protocol without intravenous contrast. Multiplanar CT image reconstructions of the cervical spine were also generated. COMPARISON:  11/01/2009 FINDINGS: CT HEAD FINDINGS Brain: No acute infarct or hemorrhage. Lateral ventricles and midline structures are unremarkable. There is moderate diffuse cerebral atrophy. No acute extra-axial fluid collections. No mass effect. Vascular: No hyperdense vessel or unexpected calcification. Skull: Normal. Negative for fracture or focal lesion. Sinuses/Orbits: No acute finding. Other: None. CT CERVICAL SPINE FINDINGS Alignment: Alignment is anatomic. Skull base and vertebrae: No acute displaced fractures. Soft tissues and spinal canal: No prevertebral fluid or swelling. No visible canal hematoma. Disc levels: Mild mid cervical facet hypertrophy. No significant spondylosis or compressive sequela. Upper chest: Airway is patent. Lung apices are clear. Other: Reconstructed images demonstrate no additional findings. IMPRESSION: 1. No acute intracranial process. 2. No acute cervical spine fracture. Electronically Signed   By: Randa Ngo M.D.   On: 08/23/2019 18:35   MR 3D Recon  At Scanner  Result Date: 08/23/2019 CLINICAL DATA:  Right upper quadrant pain.  Biliary dilatation. EXAM: MRI ABDOMEN WITHOUT AND WITH CONTRAST (INCLUDING MRCP) TECHNIQUE: Multiplanar multisequence MR imaging of the abdomen was performed both before and after the administration of intravenous contrast. Heavily T2-weighted images of the biliary and pancreatic ducts were obtained, and three-dimensional MRCP images were rendered by post processing. CONTRAST:  7.31m GADAVIST GADOBUTROL 1 MMOL/ML IV SOLN COMPARISON:  Right upper quadrant ultrasound same day FINDINGS: Lower chest: Cardiomegaly Hepatobiliary: There is severe intrahepatic and extrahepatic biliary dilatation. The common bile duct measures 17 mm. There is a large stone within the distal common bile duct (series 3, image 15). Status post cholecystectomy. No focal liver lesion. Pancreas: No mass, inflammatory changes, or other parenchymal abnormality identified. Spleen:  Within normal limits in size and appearance. Adrenals/Urinary Tract: No masses identified. No evidence of hydronephrosis. Stomach/Bowel: Visualized portions within the abdomen are unremarkable. Vascular/Lymphatic: No pathologically enlarged lymph nodes identified. No abdominal aortic aneurysm demonstrated. Other:  None. Musculoskeletal: No suspicious bone lesions identified. IMPRESSION: Severe  intrahepatic and extrahepatic biliary dilatation with large stone within the distal common bile duct. Electronically Signed   By: Ulyses Jarred M.D.   On: 08/23/2019 22:49   DG Chest Port 1 View  Result Date: 08/23/2019 CLINICAL DATA:  Fever, altered level of consciousness EXAM: PORTABLE CHEST 1 VIEW COMPARISON:  03/12/2018 FINDINGS: Single frontal view of the chest demonstrates an unremarkable cardiac silhouette. There is increased central vascular congestion, with mild diffuse interstitial prominence. No focal consolidation, effusion, or pneumothorax. No acute bony abnormalities. IMPRESSION: 1.  Increased central vascular congestion. 2. No acute airspace disease. Electronically Signed   By: Randa Ngo M.D.   On: 08/23/2019 18:15   MR ABDOMEN MRCP W WO CONTAST  Result Date: 08/23/2019 CLINICAL DATA:  Right upper quadrant pain.  Biliary dilatation. EXAM: MRI ABDOMEN WITHOUT AND WITH CONTRAST (INCLUDING MRCP) TECHNIQUE: Multiplanar multisequence MR imaging of the abdomen was performed both before and after the administration of intravenous contrast. Heavily T2-weighted images of the biliary and pancreatic ducts were obtained, and three-dimensional MRCP images were rendered by post processing. CONTRAST:  7.11m GADAVIST GADOBUTROL 1 MMOL/ML IV SOLN COMPARISON:  Right upper quadrant ultrasound same day FINDINGS: Lower chest: Cardiomegaly Hepatobiliary: There is severe intrahepatic and extrahepatic biliary dilatation. The common bile duct measures 17 mm. There is a large stone within the distal common bile duct (series 3, image 15). Status post cholecystectomy. No focal liver lesion. Pancreas: No mass, inflammatory changes, or other parenchymal abnormality identified. Spleen:  Within normal limits in size and appearance. Adrenals/Urinary Tract: No masses identified. No evidence of hydronephrosis. Stomach/Bowel: Visualized portions within the abdomen are unremarkable. Vascular/Lymphatic: No pathologically enlarged lymph nodes identified. No abdominal aortic aneurysm demonstrated. Other:  None. Musculoskeletal: No suspicious bone lesions identified. IMPRESSION: Severe intrahepatic and extrahepatic biliary dilatation with large stone within the distal common bile duct. Electronically Signed   By: KUlyses JarredM.D.   On: 08/23/2019 22:49   [place] LIMITED RUQ  Result Date: 08/23/2019 CLINICAL DATA:  Pain.  Fever and sepsis with elevated LFTs. EXAM: ULTRASOUND ABDOMEN LIMITED RIGHT UPPER QUADRANT COMPARISON:  None. FINDINGS: Gallbladder: The patient is status post prior cholecystectomy. Common bile  duct: Diameter: The common bile duct is significantly dilated measuring approximately 1.5 cm proximally tapering to approximately 0.9 cm distally. Liver: There is significant intrahepatic biliary ductal dilatation. Portal vein is patent on color Doppler imaging with normal direction of blood flow towards the liver. Other: None. IMPRESSION: 1. Status post cholecystectomy. 2. There is significant intrahepatic and extrahepatic biliary ductal dilatation raising concern for an underlying obstructing process. Follow-up with MRCP/ERCP is recommended. Electronically Signed   By: CConstance HolsterM.D.   On: 08/23/2019 19:50        Scheduled Meds: . carbamide peroxide  1 drop Both EARS BID  . Chlorhexidine Gluconate Cloth  6 each Topical Daily  . doxepin  10 mg Oral QHS  . gabapentin  100 mg Oral Daily  . gabapentin  400 mg Oral QHS  . indomethacin  100 mg Rectal Once  . insulin aspart  0-9 Units Subcutaneous Q4H  . pravastatin  20 mg Oral q1800   Continuous Infusions: . sodium chloride 75 mL/hr at 08/25/19 0524  . cefTRIAXone (ROCEPHIN)  IV    . famotidine (PEPCID) IV Stopped (08/24/19 1903)  . lactated ringers 125 mL/hr at 08/25/19 1203  . metronidazole 500 mg (08/25/19 1253)     Time spent: 35 minutes with over 50% of the time coordinating the patient's care  Harold Hedge, DO [facility] Pager [phone]  Call night coverage person covering after 7pm

## 2019-08-25 NOTE — Progress Notes (Signed)
CH visited pt. while rounding on ICU.  Pt. lying back in bed watching TV with husband at bedside awaiting procedure to clear blockage in bile duct, but procedure delayed due to low HR, pt. said.  Pt. expressed some impatience because she cannot eat or drink until procedure.  Pt. and husband have lived in [place] since [place], moved from [place] for husband's job at [employer].  Pt. 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  Pt.'s children and 7 grandchildren all live locally; dtr. suffered the loss of her husband last year.  Pt.'s faith appears to be a major source of strength and guidance in her life; she showed me her very well-worn Bible that her husband brought to her in the hospital.  Overall, pt. appears to be coping well and anticipates discharge a day or so after procedure.  CH excused himself to try to get update from RN but pt. brought to procedure before [facility] made contact w/RN; [facility] remains available as needed.     08/25/19 1545  Clinical Encounter Type  Visited With Patient and family together  Visit Type Initial;Psychological support;Spiritual support;Social support;Follow-up;Pre-op  Referral From Other (Comment) (Routine Rounding)  Spiritual Encounters  Spiritual Needs Emotional  Stress Factors  Patient Stress Factors Health changes;Loss of control

## 2019-08-25 NOTE — Anesthesia Postprocedure Evaluation (Signed)
Anesthesia Post Note  Patient: Bethany Joseph  Procedure(s) Performed: ENDOSCOPIC RETROGRADE CHOLANGIOPANCREATOGRAPHY (ERCP) WITH PROPOFOL (N/A )  Patient location during evaluation: PACU Anesthesia Type: General Level of consciousness: awake and alert Pain management: pain level controlled Vital Signs Assessment: post-procedure vital signs reviewed and stable Respiratory status: spontaneous breathing, nonlabored ventilation, respiratory function stable and patient connected to nasal cannula oxygen Cardiovascular status: blood pressure returned to baseline and stable Postop Assessment: no apparent nausea or vomiting Anesthetic complications: no   No complications documented.   Last Vitals:  Vitals:   08/25/19 1801 08/25/19 1811  BP: (!) 184/90   Pulse: 62 (!) 57  Resp: 13 12  Temp: (!) 36.2 C   SpO2: 98% 99%    Last Pain:  Vitals:   08/25/19 1801  TempSrc:   PainSc: 0-No pain                 Cleda Mccreedy Orson Rho

## 2019-08-25 NOTE — Anesthesia Procedure Notes (Addendum)
Procedure Name: Intubation Date/Time: 08/25/2019 1:24 PM Performed by: Jaye Beagle, CRNA Pre-anesthesia Checklist: Patient identified, Emergency Drugs available, Suction available, Patient being monitored and Timeout performed Patient Re-evaluated:Patient Re-evaluated prior to induction Oxygen Delivery Method: Circle system utilized Preoxygenation: Pre-oxygenation with 100% oxygen Induction Type: IV induction Ventilation: Mask ventilation without difficulty Laryngoscope Size: Glidescope and 3 Grade View: Grade I Tube type: Oral Tube size: 6.5 mm Number of attempts: 1 Airway Equipment and Method: Rigid stylet Placement Confirmation: ETT inserted through vocal cords under direct vision,  positive ETCO2 and breath sounds checked- equal and bilateral Secured at: 22 cm Tube secured with: Tape Dental Injury: Teeth and Oropharynx as per pre-operative assessment  Comments: Small mouth, limited oral opening, jagged teeth.

## 2019-08-25 NOTE — Progress Notes (Signed)
Pt transported to Endoscopy with NT.  No signs of distress.  Pt on room air, belongings with family member.

## 2019-08-25 NOTE — Transfer of Care (Signed)
Immediate Anesthesia Transfer of Care Note  Patient: Bethany Joseph  Procedure(s) Performed: ENDOSCOPIC RETROGRADE CHOLANGIOPANCREATOGRAPHY (ERCP) WITH PROPOFOL (N/A )  Patient Location: PACU  Anesthesia Type:General  Level of Consciousness: awake  Airway & Oxygen Therapy: Patient Spontanous Breathing and Patient connected to face mask oxygen  Post-op Assessment: Report given to RN  Post vital signs: stable  Last Vitals:  Vitals Value Taken Time  BP 167/100 08/25/19 1731  Temp 36.2 C 08/25/19 1731  Pulse 53 08/25/19 1737  Resp 16 08/25/19 1737  SpO2 100 % 08/25/19 1737  Vitals shown include unvalidated device data.  Last Pain:  Vitals:   08/25/19 1731  TempSrc:   PainSc: 0-No pain      Patients Stated Pain Goal: 4 (08/25/19 0400)  Complications: No complications documented.

## 2019-08-25 NOTE — Anesthesia Preprocedure Evaluation (Addendum)
Anesthesia Evaluation  Patient identified by MRN, date of birth, ID band Patient awake  General Assessment Comment:Prior surgery with 3 attempts at intubation, using Mac 3 with grade 3 view, Mcgrath 3 with grade 2 view, and success with Glidescope  Reviewed: Allergy & Precautions, H&P , NPO status , reviewed documented beta blocker date and time   History of Anesthesia Complications (+) DIFFICULT AIRWAY and history of anesthetic complications  Airway Mallampati: III  TM Distance: <3 FB Neck ROM: full  Mouth opening: Limited Mouth Opening Comment: Small chin Dental  (+) Poor Dentition, Missing, Dental Advidsory Given, Partial Upper, Partial Lower   Pulmonary asthma , COPD, former smoker,    Pulmonary exam normal        Cardiovascular hypertension, + Peripheral Vascular Disease  + dysrhythmias Atrial Fibrillation  Rhythm:irregular Rate:Normal  TTE 2019: INTERPRETATION  NORMAL LEFT VENTRICULAR SYSTOLIC FUNCTION WITH AN ESTIMATED EF = 55 %  NORMAL RIGHT VENTRICULAR SYSTOLIC FUNCTION  MODERATE TRICUSPID AND MITRAL VALVE INSUFFICIENCY  TRACE AORTIC VALVE INSUFFICIENCY  MILD RV ENLARGEMENT  MILD BIATRIAL ENLARGEMENT  SCLEROTIC AORTIC VALVE WITHOUT STENOSIS    Patient with bradycardia in 20s-40s rate, asymptomatic. Per cardiology, no indication for pacemaker currently.   Neuro/Psych    GI/Hepatic GERD  Medicated and Controlled,  Endo/Other  diabetes, Type 2Hyperthyroidism   Renal/GU      Musculoskeletal  (+) Arthritis ,   Abdominal   Peds  Hematology   Anesthesia Other Findings Past Medical History: No date: Allergic rhinitis No date: Aortic valve disorder No date: Asthma No date: Atrial fibrillation (HCC) No date: Avascular necrosis (HCC) No date: Diabetes (HCC) No date: Hyperlipidemia No date: Hyperthyroidism No date: Osteoarthritis No date: Osteoporosis  Past Surgical History: No date: ABDOMINAL  HYSTERECTOMY No date: CHOLECYSTECTOMY No date: ORIF FIBULA FRACTURE No date: ORIF HUMERUS FRACTURE No date: REPLACEMENT TOTAL KNEE BILATERAL  BMI    Body Mass Index:  27.29 kg/m      Reproductive/Obstetrics                            Anesthesia Physical  Anesthesia Plan  ASA: III  Anesthesia Plan: General   Post-op Pain Management:    Induction: Intravenous  PONV Risk Score and Plan: 3 and Treatment may vary due to age or medical condition and Ondansetron  Airway Management Planned: Video Laryngoscope Planned  Additional Equipment:   Intra-op Plan:   Post-operative Plan: Extubation in OR  Informed Consent: I have reviewed the patients History and Physical, chart, labs and discussed the procedure including the risks, benefits and alternatives for the proposed anesthesia with the patient or authorized representative who has indicated his/her understanding and acceptance.     Dental Advisory Given  Plan Discussed with: Anesthesiologist  Anesthesia Plan Comments: (Plan glidescope intubation)       Anesthesia Quick Evaluation

## 2019-08-25 NOTE — Progress Notes (Signed)
Pt returned to ICU post procedure, no apparent distress.  Reports feeling well.  Family at bedside

## 2019-08-25 NOTE — Progress Notes (Signed)
Pt have being having long pauses, HR as low as 28 -50, pt is asymptomatic, denies any chest pain with each episode. atropine IVP given x 1, pt slept well. Pt  Being NPO for possible ERCP today. PRN pain medication given x 2 time in last 12 hrs.

## 2019-08-26 ENCOUNTER — Encounter: Payer: Self-pay | Admitting: Gastroenterology

## 2019-08-26 LAB — GLUCOSE, CAPILLARY
Glucose-Capillary: 110 mg/dL — ABNORMAL HIGH (ref 70–99)
Glucose-Capillary: 135 mg/dL — ABNORMAL HIGH (ref 70–99)
Glucose-Capillary: 117 mg/dL — ABNORMAL HIGH (ref 70–99)
Glucose-Capillary: 199 mg/dL — ABNORMAL HIGH (ref 70–99)
Glucose-Capillary: 161 mg/dL — ABNORMAL HIGH (ref 70–99)
Glucose-Capillary: 170 mg/dL — ABNORMAL HIGH (ref 70–99)

## 2019-08-26 LAB — BASIC METABOLIC PANEL
Anion gap: 4 — ABNORMAL LOW (ref 5–15)
BUN: 11 mg/dL (ref 8–23)
CO2: 22 mmol/L (ref 22–32)
Calcium: 8.3 mg/dL — ABNORMAL LOW (ref 8.9–10.3)
Chloride: 110 mmol/L (ref 98–111)
Creatinine, Ser: 0.73 mg/dL (ref 0.44–1.00)
GFR calc Af Amer: >60 mL/min (ref >60)
GFR calc non Af Amer: >60 mL/min (ref >60)
Glucose, Bld: 160 mg/dL — ABNORMAL HIGH (ref 70–99)
Potassium: 5.3 mmol/L — ABNORMAL HIGH (ref 3.5–5.1)
Sodium: 136 mmol/L (ref 135–145)

## 2019-08-26 LAB — CBC
HCT: 30.2 % — ABNORMAL LOW (ref 36.0–46.0)
Hemoglobin: 9.8 g/dL — ABNORMAL LOW (ref 12.0–15.0)
MCH: 29.7 pg (ref 26.0–34.0)
MCHC: 32.5 g/dL (ref 30.0–36.0)
MCV: 91.5 fL (ref 80.0–100.0)
Platelets: 284 10*3/uL (ref 150–400)
RBC: 3.3 MIL/uL — ABNORMAL LOW (ref 3.87–5.11)
RDW: 16.8 % — ABNORMAL HIGH (ref 11.5–15.5)
WBC: 11.2 10*3/uL — ABNORMAL HIGH (ref 4.0–10.5)
nRBC: 0 % (ref 0.0–0.2)

## 2019-08-26 LAB — URINE CULTURE: Culture: 40000 — AB

## 2019-08-26 LAB — MAGNESIUM: Magnesium: 1.9 mg/dL (ref 1.7–2.4)

## 2019-08-26 MED ORDER — LIOTHYRONINE SODIUM 25 MCG PO TABS: 75.0000 ug | ORAL_TABLET | Freq: Every day | ORAL | Status: DC

## 2019-08-26 MED ORDER — ZOLPIDEM TARTRATE 5 MG PO TABS: 2.5000 mg | ORAL_TABLET | Freq: Every evening | ORAL | Status: DC | PRN

## 2019-08-26 MED ORDER — SIMETHICONE 80 MG PO CHEW
80.0000 mg | CHEWABLE_TABLET | Freq: Four times a day (QID) | ORAL | Status: DC | PRN
  Administered 2019-08-26 – 2019-08-27 (×2): 80 mg via ORAL
  Filled 2019-08-26 (×3): qty 1

## 2019-08-26 MED ORDER — PANTOPRAZOLE SODIUM 40 MG PO TBEC
40.0000 mg | DELAYED_RELEASE_TABLET | Freq: Every day | ORAL | Status: DC
  Administered 2019-08-27: 40 mg via ORAL
  Filled 2019-08-26 (×2): qty 1

## 2019-08-26 MED ORDER — LEVOTHYROXINE SODIUM 25 MCG PO TABS: 25.0000 ug | ORAL_TABLET | Freq: Every day | ORAL | Status: DC

## 2019-08-26 MED ORDER — LIOTHYRONINE SODIUM 25 MCG PO TABS
75.0000 ug | ORAL_TABLET | Freq: Every day | ORAL | Status: DC
  Filled 2019-08-26: qty 3

## 2019-08-26 NOTE — Progress Notes (Signed)
   08/26/19 2352  Assess: MEWS Score  Pulse Rate 72  SpO2 97 %  Assess: MEWS Score  MEWS Temp 0  MEWS Systolic 0  MEWS Pulse 0  MEWS RR 0  MEWS LOC 0  MEWS Score 0  MEWS Score Color Green  Assess: if the MEWS score is Yellow or Red  Were vital signs taken at a resting state? Yes  Focused Assessment Documented focused assessment  Early Detection of Sepsis Score *See Row Information* Low  MEWS guidelines implemented *See Row Information* No, vital signs rechecked  Notify: Charge Nurse/RN  Name of Charge Nurse/RN Notified Merlene Morse RN  Date Charge Nurse/RN Notified 08/26/19  Time Charge Nurse/RN Notified 2359  Document  Patient Outcome Other (Comment) (history of afib)  Progress note created (see row info) Yes

## 2019-08-26 NOTE — Progress Notes (Signed)
[facility]    SUBJECTIVE: Patient states to feel much better she had a GI procedure relieving the obstruction and removing the stone she has been off antibiotic therapy she feels more like herself and is interested in being able to get out of bed.  She has not noticed any significant heart rate changes no near syncope no vertigo   Vitals:   08/26/19 0800 08/26/19 0805 08/26/19 0900 08/26/19 1000  BP:  122/86 (!) 147/80   Pulse: 75 (!) 50 (!) 46 88  Resp: 17 15 17 17   Temp: 97.6 F (36.4 C)     TempSrc: Axillary     SpO2: 98% 99% 100% 95%  Weight:      Height:         Intake/Output Summary (Last 24 hours) at 08/26/2019 1043 Last data filed at 08/26/2019 1024 Gross per 24 hour  Intake 2697.42 ml  Output --  Net 2697.42 ml      PHYSICAL EXAM  General: Well developed, well nourished, in no acute distress HEENT:  Normocephalic and atramatic Neck:  No JVD.  Lungs: Clear bilaterally to auscultation and percussion. Heart: Irregular irregular bradycardic. Normal S1 and S2 without gallops or murmurs.  Abdomen: Bowel sounds are positive, abdomen soft and non-tender  Msk:  Back normal, normal gait. Normal strength and tone for age. Extremities: No clubbing, cyanosis or edema.   Neuro: Alert and oriented X 3. Psych:  Good affect, responds appropriately   LABS: Basic Metabolic Panel: Recent Labs    08/23/19 1746 08/23/19 2023 08/24/19 0432 08/24/19 0432 08/25/19 0447 08/26/19 0420  NA   < >  --  140   < > 138 136  K   < >  --  3.2*   < > 4.3 5.3*  CL   < >  --  110   < > 109 110  CO2   < >  --  26   < > 24 22  GLUCOSE   < >  --  127*   < > 113* 160*  BUN   < >  --  14   < > 9 11  CREATININE   < >  --  0.72   < > 0.57 0.73  CALCIUM   < >  --  6.8*   < > 8.5* 8.3*  MG   < > 0.7* 2.5*   < > 1.9 1.9  PHOS  --  2.7 3.9  --   --   --    < > = values in this interval not displayed.   Liver Function Tests: Recent Labs    08/24/19 0432 08/25/19 0447  AST 68* 40  ALT  33 29  ALKPHOS 362* 356*  BILITOT 4.1* 3.3*  PROT 6.2* 6.5  ALBUMIN 2.3* 2.5*   Recent Labs    08/23/19 2000  LIPASE 19   CBC: Recent Labs    08/23/19 1746 08/23/19 1746 08/24/19 0432 08/24/19 0432 08/25/19 0448 08/26/19 0420  WBC 16.9*   < > 11.6*   < > 8.0 11.2*  NEUTROABS 15.2*  --  9.4*  --   --   --   HGB 11.6*   < > 9.1*   < > 9.3* 9.8*  HCT 33.9*   < > 27.5*   < > 29.1* 30.2*  MCV 87.4   < > 90.8   < > 92.4 91.5  PLT 358   < > 254   < > 260 284   < > =  values in this interval not displayed.   Cardiac Enzymes: No results for input(s): CKTOTAL, CKMB, CKMBINDEX, TROPONINI in the last 72 hours. BNP: Invalid input(s): POCBNP D-Dimer: No results for input(s): DDIMER in the last 72 hours. Hemoglobin A1C: Recent Labs    08/24/19 0432  HGBA1C 7.3*   Fasting Lipid Panel: No results for input(s): CHOL, HDL, LDLCALC, TRIG, CHOLHDL, LDLDIRECT in the last 72 hours. Thyroid Function Tests: No results for input(s): TSH, T4TOTAL, T3FREE, THYROIDAB in the last 72 hours.  Invalid input(s): FREET3 Anemia Panel: No results for input(s): VITAMINB12, FOLATE, FERRITIN, TIBC, IRON, RETICCTPCT in the last 72 hours.  DG C-Arm 1-60 Min-No Report  Result Date: 08/25/2019 Fluoroscopy was utilized by the requesting physician.  No radiographic interpretation.     Echo pending  TELEMETRY: A. fib bradycardia rates in the 40s and mostly in the 50s now  ASSESSMENT AND PLAN:  Active Problems:   Sepsis (HCC)   Acute cholangitis   Choledocholithiasis   Stricture and stenosis of esophagus Atrial fibrillation Bradycardia  Plan Continue to hold beta-blocker and digoxin There has been some improvement in her heart rate over time as well as since the procedure We need to continue to treat their infection antibiotic wise and be sure that she is infection free I am still hopeful that a permanent pacemaker will not be necessary at this stage If the patient has episode of tachycardia  extensive pauses or extreme bradycardia in her 30s or less then we may be forced to proceed with a permanent pacemaker Continue broad-spectrum antibiotic therapy for cholangitis Atrial fibrillation rate has been controlled and in fact has been bradycardic Would recommend long-term anticoagulation because of atrial fibrillation be resumed with Eliquis Will hopefully be able to evaluate the patient as an outpatient once she is recovered from her GI procedure If heart rate stays in the high 40s low 50s are better than I do not recommend a permanent pacemaker as long she stays asymptomatic at least not at this point   Alwyn Pea, MD 08/26/2019 10:43 AM

## 2019-08-26 NOTE — Progress Notes (Signed)
CH visited pt. while rounding on ICU; pt. lying down in bed w/lights off watching TV.  Pt. says she was feeling good today until she ate something --> now is feeling stomach/GI discomfort.  Pt. asked if she could have a carbonated drink; CH brought diet lemon soda after checking w/RN.  Pt. grateful for soda but appears frustrated and irritated w/discomfort.  Pt. requests CH check in on her periodically; CH will follow up as possible.    08/26/19 1700  Clinical Encounter Type  Visited With Patient  Visit Type Follow-up;Psychological support;Social support;Critical Care  Referral From Patient  Spiritual Encounters  Spiritual Needs Emotional  Stress Factors  Patient Stress Factors Health changes;Loss of control

## 2019-08-26 NOTE — Progress Notes (Signed)
PROGRESS NOTE    Bethany Joseph  IZT:[id number] DOB: 05-30-1945 DOA: 08/23/2019 PCP: Guadalupe Maple, MD  Brief Narrative:  This is a 74 year old female with past medical history of type 2 diabetes, paroxysmal tachycardia and chronic atrial fibrillation, COPD, hypothyroidism, hyperlipidemia, hypertension, valvular disorder who presented to the ER on 7/11 with altered mental status, nausea and vomiting and abdominal pain.  ED course: Vital signs were temp 103 F, bp 131/73, hr 86 bpm, and O2 sats92% on RA.Lab results revealed Na+ 133, glucose 205, calcium 8.4, alk phos 480, albumin 3.2, AST 72, total bilirubin 4.1, troponin 26, lactic acid 2.4, wbc 16.9, hgb 11.6, and COVID-19 negative. [place] Abd limited revealed significant intrahepatic and extrahepatic biliary ductal dilatation raising concern for an underlying obstructing process.  GI was consulted for MRCP/ERCP.  She was admitted to the ICU under the PCCM service for sepsis secondary to cholangitis and started on broad-spectrum antibiotics.  Cardiology was consulted for preop clearance  Overnight 7/13 patient had asymptomatic A. fib with bradycardia and long pauses and was given atropine x1.  7/14: Patient seen and examined.  Abdominal pain resolved.  Status post successful ERCP with stone retrieval yesterday.  Tolerated procedure well.  No complaints this morning.  Is hungry, wants to eat.  Heart rate improved.  Has not required atropine since early a.m. 08/25/2019    Assessment & Plan:   Active Problems:   Sepsis ([place])   Acute cholangitis   Choledocholithiasis   Stricture and stenosis of esophagus  Acute cholangitis Severe sepsis secondary to above Patient status post ERCP Successful retrieval of multiple stones Seen by GI post procedurally Plan: Continue antibiotics, will complete 5 days from today Advance diet as tolerated  Atrial fibrillation with slow ventricular response and pauses Cardiology consulted Atropine x1  for bradycardia to high 20s Cardiology continue to follow No plans for pacemaker implantation at this time Continue current plan of care  Asthma Stable Continue as needed bronchodilators  GERD Pepcid  Type 2 diabetes Sliding scale insulin  Hypomagnesemia Hypokalemia Check and replete as needed   DVT prophylaxis: SCDs for now, we will plan to start apixaban on 08/27/2019 Code Status: Full Family Communication: None today Disposition Plan: Status is: Inpatient  Remains inpatient appropriate because:Inpatient level of care appropriate due to severity of illness   Dispo: The patient is from: Home              Anticipated d/c is to: Home              Anticipated d/c date is: 1 day              Patient currently is not medically stable to d/c.  Patient status post ERCP on 08/25/2019.  Advancing diet today.  Patient remained stable from GI and cardiac standpoint tentative plan to discharge home on 08/27/2019.      Consultants:   GI  Cardiology  Procedures:   ERCP, 08/25/2019  Antimicrobials:   Ceftriaxone  Flagyl   Subjective: Patient examined.  No complaints  Objective: Vitals:   08/26/19 1200 08/26/19 1300 08/26/19 1400 08/26/19 1500  BP: 123/71     Pulse: (!) 25  63 95  Resp: 18 (!) _0 Temp:      TempSrc:      SpO2: 96%  99% 98%  Weight:      Height:        Intake/Output Summary (Last 24 hours) at 08/26/2019 1608 Last data filed at 08/26/2019 1134  Gross per 24 hour  Intake 2784.24 ml  Output --  Net 2784.24 ml   Filed Weights   08/23/19 1751  Weight: 90.7 kg    Examination:  General exam: Appears calm and comfortable  Respiratory system: Clear to auscultation. Respiratory effort normal. Cardiovascular system: S1 & S2 heard, RRR. No JVD, murmurs, rubs, gallops or clicks. No pedal edema. Gastrointestinal system: Abdomen is nondistended, soft and nontender. No organomegaly or masses felt. Normal bowel sounds heard. Central nervous  system: Alert and oriented. No focal neurological deficits. Extremities: Symmetric 5 x 5 power. Skin: No rashes, lesions or ulcers Psychiatry: Judgement and insight appear normal. Mood & affect appropriate.     Data Reviewed: I have personally reviewed following labs and imaging studies  CBC: Recent Labs  Lab 08/23/19 1746 08/24/19 0432 08/25/19 0448 08/26/19 0420  WBC 16.9* 11.6* 8.0 11.2*  NEUTROABS 15.2* 9.4*  --   --   HGB 11.6* 9.1* 9.3* 9.8*  HCT 33.9* 27.5* 29.1* 30.2*  MCV 87.4 90.8 92.4 91.5  PLT 358 254 260 280   Basic Metabolic Panel: Recent Labs  Lab 08/23/19 1746 08/23/19 2023 08/24/19 0432 08/25/19 0447 08/26/19 0420  NA 133*  --  140 138 136  K 3.8  --  3.2* 4.3 5.3*  CL 98  --  110 109 110  CO2 22  --  _0 GLUCOSE 205*  --  127* 113* 160*  BUN 16  --  _1 CREATININE 0.75  --  0.72 0.57 0.73  CALCIUM 8.4*  --  6.8* 8.5* 8.3*  MG  --  0.7* 2.5* 1.9 1.9  PHOS  --  2.7 3.9  --   --    GFR: Estimated Creatinine Clearance: 71.6 mL/min (by C-G formula based on SCr of 0.73 mg/dL). Liver Function Tests: Recent Labs  Lab 08/23/19 1746 08/24/19 0432 08/25/19 0447  AST 72* 68* 40  ALT 37 33 29  ALKPHOS 480* 362* 356*  BILITOT 4.1* 4.1* 3.3*  PROT 8.3* 6.2* 6.5  ALBUMIN 3.2* 2.3* 2.5*   Recent Labs  Lab 08/23/19 2000  LIPASE 19   No results for input(s): AMMONIA in the last 168 hours. Coagulation Profile: Recent Labs  Lab 08/23/19 1746  INR 1.2   Cardiac Enzymes: No results for input(s): CKTOTAL, CKMB, CKMBINDEX, TROPONINI in the last 168 hours. BNP (last 3 results) No results for input(s): PROBNP in the last 8760 hours. HbA1C: Recent Labs    08/24/19 0432  HGBA1C 7.3*   CBG: Recent Labs  Lab 08/25/19 2039 08/25/19 2333 08/26/19 0310 08/26/19 0714 08/26/19 1143  GLUCAP 192* 159* 135* 117* 199*   Lipid Profile: No results for input(s): CHOL, HDL, LDLCALC, TRIG, CHOLHDL, LDLDIRECT in the last 72 hours. Thyroid  Function Tests: No results for input(s): TSH, T4TOTAL, FREET4, T3FREE, THYROIDAB in the last 72 hours. Anemia Panel: No results for input(s): VITAMINB12, FOLATE, FERRITIN, TIBC, IRON, RETICCTPCT in the last 72 hours. Sepsis Labs: Recent Labs  Lab 08/23/19 1746 08/23/19 2023 08/24/19 0432 08/25/19 0447  PROCALCITON  --  1.39 2.37 0.84  LATICACIDVEN 2.4* 1.1  --   --     Recent Results (from the past 240 hour(s))  Blood Culture (routine x 2)     Status: None (Preliminary result)   Collection Time: 08/23/19  5:46 PM   Specimen: BLOOD  Result Value Ref Range Status   Specimen Description BLOOD BLOOD LEFT FOREARM  Final   Special Requests  Final    BOTTLES DRAWN AEROBIC AND ANAEROBIC Blood Culture adequate volume   Culture   Final    NO GROWTH 3 DAYS Performed at [facility], [address] [place], [place] [address]    Report Status PENDING  Incomplete  Blood Culture (routine x 2)     Status: None (Preliminary result)   Collection Time: 08/23/19  5:46 PM   Specimen: BLOOD  Result Value Ref Range Status   Specimen Description BLOOD LEFT ANTECUBITAL  Final   Special Requests   Final    BOTTLES DRAWN AEROBIC AND ANAEROBIC Blood Culture adequate volume   Culture   Final    NO GROWTH 3 DAYS Performed at [facility], [address], [place], [place] [address]    Report Status PENDING  Incomplete  SARS Coronavirus 2 by RT PCR (hospital order, performed in [place] hospital lab) Nasopharyngeal Nasopharyngeal Swab     Status: None   Collection Time: 08/23/19  5:46 PM   Specimen: Nasopharyngeal Swab  Result Value Ref Range Status   SARS Coronavirus 2 NEGATIVE NEGATIVE Final    Comment: (NOTE) SARS-CoV-2 target nucleic acids are NOT DETECTED.  The SARS-CoV-2 RNA is generally detectable in upper and lower respiratory specimens during the acute phase of infection. The lowest concentration of SARS-CoV-2 viral copies this assay can detect is 250 copies / mL.  A negative result does not preclude SARS-CoV-2 infection and should not be used as the sole basis for treatment or other patient management decisions.  A negative result may occur with improper specimen collection / handling, submission of specimen other than nasopharyngeal swab, presence of viral mutation(s) within the areas targeted by this assay, and inadequate number of viral copies (<250 copies / mL). A negative result must be combined with clinical observations, patient history, and epidemiological information.  Fact Sheet for Patients:   StrictlyIdeas.no  Fact Sheet for Healthcare Providers: BankingDealers.co.za  This test is not yet approved or  cleared by the [place] FDA and has been authorized for detection and/or diagnosis of SARS-CoV-2 by FDA under an Emergency Use Authorization (EUA).  This EUA will remain in effect (meaning this test can be used) for the duration of the COVID-19 declaration under Section 564(b)(1) of the Act, 21 U.S.C. section 360bbb-3(b)(1), unless the authorization is terminated or revoked sooner.  Performed at [facility], [address] [place], [place] [address]   Urine culture     Status: Abnormal   Collection Time: 08/23/19  8:23 PM   Specimen: Urine, Random  Result Value Ref Range Status   Specimen Description   Final    URINE, RANDOM Performed at [facility], [address], [place], [place] [address]    Special Requests   Final    NONE Performed at [facility], [place], [place] [address]    Culture 40,000 COLONIES/mL ENTEROCOCCUS FAECALIS (A)  Final   Report Status 08/26/2019 FINAL  Final   Organism ID, Bacteria ENTEROCOCCUS FAECALIS (A)  Final      Susceptibility   Enterococcus faecalis - MIC*    AMPICILLIN <=2 SENSITIVE Sensitive     NITROFURANTOIN <=16 SENSITIVE Sensitive     VANCOMYCIN 1 SENSITIVE Sensitive     * 40,000  COLONIES/mL ENTEROCOCCUS FAECALIS  MRSA PCR Screening     Status: None   Collection Time: 08/24/19  4:00 PM   Specimen: Nasal Mucosa; Nasopharyngeal  Result Value Ref Range Status   MRSA by  PCR NEGATIVE NEGATIVE Final    Comment:        The GeneXpert MRSA Assay (FDA approved for NASAL specimens only), is one component of a comprehensive MRSA colonization surveillance program. It is not intended to diagnose MRSA infection nor to guide or monitor treatment for MRSA infections. Performed at [facility], [address], [place], [place] [address]          Radiology Studies: DG C-Arm 1-60 Min-No Report  Result Date: 08/25/2019 Fluoroscopy was utilized by the requesting physician.  No radiographic interpretation.        Scheduled Meds: . carbamide peroxide  1 drop Both EARS BID  . Chlorhexidine Gluconate Cloth  6 each Topical Daily  . doxepin  10 mg Oral QHS  . gabapentin  100 mg Oral Daily  . gabapentin  400 mg Oral QHS  . indomethacin  100 mg Rectal Once  . insulin aspart  0-9 Units Subcutaneous Q4H  . [START ON 08/27/2019] levothyroxine  25 mcg Oral QAC breakfast  . [START ON 08/27/2019] liothyronine  75 mcg Oral QAC breakfast  . [START ON 08/27/2019] pantoprazole  40 mg Oral Daily  . pravastatin  20 mg Oral q1800   Continuous Infusions: . sodium chloride 75 mL/hr at 08/26/19 1134  . cefTRIAXone (ROCEPHIN)  IV    . metronidazole 500 mg (08/26/19 1403)     LOS: 3 days    Time spent: 25 minutes    Sidney Ace, MD [facility] Pager [phone]  If 7PM-7AM, please contact night-coverage 08/26/2019, 4:08 PM

## 2019-08-26 NOTE — Progress Notes (Signed)
Wyline Mood , MD [address], [address], [place], [place], [address] [address], [place], [place], [address] Phone: [phone]  Fax: [phone]   Bethany Joseph is being followed for acute cholangitis day 2 of follow up   Subjective: No pain , doing well, not had a bowel movement , wants to eat    Objective: Vital signs in last 24 hours: Vitals:   08/26/19 0800 08/26/19 0805 08/26/19 0900 08/26/19 1000  BP:  122/86 (!) 147/80   Pulse: 75 (!) 50 (!) 46 88  Resp: 17 15 17 17   Temp: 97.6 F (36.4 C)     TempSrc: Axillary     SpO2: 98% 99% 100% 95%  Weight:      Height:       Weight change:   Intake/Output Summary (Last 24 hours) at 08/26/2019 1108 Last data filed at 08/26/2019 1024 Gross per 24 hour  Intake 2697.42 ml  Output --  Net 2697.42 ml     Exam: Heart:: Regular rate and rhythm, S1S2 present or without murmur or extra heart sounds Lungs: normal, clear to auscultation and clear to auscultation and percussion Abdomen: soft, nontender, normal bowel sounds   Lab Results: @LABTEST2 @ Micro Results: Recent Results (from the past 240 hour(s))  Blood Culture (routine x 2)     Status: None (Preliminary result)   Collection Time: 08/23/19  5:46 PM   Specimen: BLOOD  Result Value Ref Range Status   Specimen Description BLOOD BLOOD LEFT FOREARM  Final   Special Requests   Final    BOTTLES DRAWN AEROBIC AND ANAEROBIC Blood Culture adequate volume   Culture   Final    NO GROWTH 3 DAYS Performed at [facility], [address] [place], [address] [place]    Report Status PENDING  Incomplete  Blood Culture (routine x 2)     Status: None (Preliminary result)   Collection Time: 08/23/19  5:46 PM   Specimen: BLOOD  Result Value Ref Range Status   Specimen Description BLOOD LEFT ANTECUBITAL  Final   Special Requests   Final    BOTTLES DRAWN AEROBIC AND ANAEROBIC Blood Culture adequate volume   Culture   Final    NO GROWTH 3  DAYS Performed at [facility], [address] [place], [address] [place]    Report Status PENDING  Incomplete  SARS Coronavirus 2 by RT PCR (hospital order, performed in [facility] hospital lab) Nasopharyngeal Nasopharyngeal Swab     Status: None   Collection Time: 08/23/19  5:46 PM   Specimen: Nasopharyngeal Swab  Result Value Ref Range Status   SARS Coronavirus 2 NEGATIVE NEGATIVE Final    Comment: (NOTE) SARS-CoV-2 target nucleic acids are NOT DETECTED.  The SARS-CoV-2 RNA is generally detectable in upper and lower respiratory specimens during the acute phase of infection. The lowest concentration of SARS-CoV-2 viral copies this assay can detect is 250 copies / mL. A negative result does not preclude SARS-CoV-2 infection and should not be used as the sole basis for treatment or other patient management decisions.  A negative result may occur with improper specimen collection / handling, submission of specimen other than nasopharyngeal swab, presence of viral mutation(s) within the areas targeted by this assay, and inadequate number of viral copies (<250 copies / mL). A negative result must be combined with clinical observations, patient history, and epidemiological information.  Fact Sheet for Patients:   [facility]  Fact Sheet for Healthcare Providers: 10/24/19  This  test is not yet approved or  cleared by the [place] and has been authorized for detection and/or diagnosis of SARS-CoV-2 by FDA under an Emergency Use Authorization (EUA).  This EUA will remain in effect (meaning this test can be used) for the duration of the COVID-19 declaration under Section 564(b)(1) of the Act, 21 U.S.C. section 360bbb-3(b)(1), unless the authorization is terminated or revoked sooner.  Performed at [facility], [address] [place], [place] [address]   Urine culture     Status: Abnormal    Collection Time: 08/23/19  8:23 PM   Specimen: Urine, Random  Result Value Ref Range Status   Specimen Description   Final    URINE, RANDOM Performed at [facility], [address] [place], [place] [address]    Special Requests   Final    NONE Performed at [facility], [address] [place], [place] [address]    Culture 40,000 COLONIES/mL ENTEROCOCCUS FAECALIS (A)  Final   Report Status 08/26/2019 FINAL  Final   Organism ID, Bacteria ENTEROCOCCUS FAECALIS (A)  Final      Susceptibility   Enterococcus faecalis - MIC*    AMPICILLIN <=2 SENSITIVE Sensitive     NITROFURANTOIN <=16 SENSITIVE Sensitive     VANCOMYCIN 1 SENSITIVE Sensitive     * 40,000 COLONIES/mL ENTEROCOCCUS FAECALIS  MRSA PCR Screening     Status: None   Collection Time: 08/24/19  4:00 PM   Specimen: Nasal Mucosa; Nasopharyngeal  Result Value Ref Range Status   MRSA by PCR NEGATIVE NEGATIVE Final    Comment:        The GeneXpert MRSA Assay (FDA approved for NASAL specimens only), is one component of a comprehensive MRSA colonization surveillance program. It is not intended to diagnose MRSA infection nor to guide or monitor treatment for MRSA infections. Performed at [facility], [address] [place], [place] [address]    Studies/Results: DG C-Arm 1-60 Min-No Report  Result Date: 08/25/2019 Fluoroscopy was utilized by the requesting physician.  No radiographic interpretation.   Medications: I have reviewed the patient's current medications. Scheduled Meds: . carbamide peroxide  1 drop Both EARS BID  . Chlorhexidine Gluconate Cloth  6 each Topical Daily  . doxepin  10 mg Oral QHS  . gabapentin  100 mg Oral Daily  . gabapentin  400 mg Oral QHS  . indomethacin  100 mg Rectal Once  . insulin aspart  0-9 Units Subcutaneous Q4H  . pravastatin  20 mg Oral q1800   Continuous Infusions: . sodium chloride 75 mL/hr (08/26/19 1057)  . cefTRIAXone (ROCEPHIN)  IV    .  famotidine (PEPCID) IV Stopped (08/24/19 1903)  . metronidazole Stopped (08/26/19 0423)   PRN Meds:.albuterol, atropine, docusate sodium, HYDROcodone-acetaminophen, morphine injection, ondansetron (ZOFRAN) IV, polyethylene glycol  CBC Latest Ref Rng & Units 08/26/2019 08/25/2019 08/24/2019  WBC 4.0 - 10.5 K/uL 11.2(H) 8.0 11.6(H)  Hemoglobin 12.0 - 15.0 g/dL 1.4(H) 8.8(I) 7.5(Z)  Hematocrit 36 - 46 % 30.2(L) 29.1(L) 27.5(L)  Platelets 150 - 400 K/uL 284 260 254    Assessment: Active Problems:   Sepsis (HCC)   Acute cholangitis   Choledocholithiasis   Stricture and stenosis of esophagus   Bethany Joseph 74 y.o. female underwent ERCP yesterday for acute cholangitis secondary to choledocholithiasis.  Multiple stones were extracted with the balloon device.  Plan: 1. Advance diet as tolerated 2. Complete course of antibiotics   I will sign off.  Please call  me if any further GI concerns or questions.  We would like to thank you for the opportunity to participate in the care of Bethany Joseph.    LOS: 3 days   Wyline Mood, MD 08/26/2019, 11:08 AM

## 2019-08-27 DIAGNOSIS — I4819 Other persistent atrial fibrillation: Secondary | ICD-10-CM

## 2019-08-27 LAB — BLOOD CULTURE ID PANEL (REFLEXED)

## 2019-08-27 LAB — CBC WITH DIFFERENTIAL/PLATELET
Abs Immature Granulocytes: 0.07 10*3/uL (ref 0.00–0.07)
Basophils Absolute: 0 10*3/uL (ref 0.0–0.1)
Basophils Relative: 0 %
Eosinophils Absolute: 0.2 10*3/uL (ref 0.0–0.5)
Eosinophils Relative: 2 %
HCT: 28.4 % — ABNORMAL LOW (ref 36.0–46.0)
Hemoglobin: 9.7 g/dL — ABNORMAL LOW (ref 12.0–15.0)
Immature Granulocytes: 1 %
Lymphocytes Relative: 13 %
Lymphs Abs: 1.3 10*3/uL (ref 0.7–4.0)
MCH: 29.8 pg (ref 26.0–34.0)
MCHC: 34.2 g/dL (ref 30.0–36.0)
MCV: 87.1 fL (ref 80.0–100.0)
Monocytes Absolute: 1 10*3/uL (ref 0.1–1.0)
Monocytes Relative: 10 %
Neutro Abs: 7.7 10*3/uL (ref 1.7–7.7)
Neutrophils Relative %: 74 %
Platelets: 266 10*3/uL (ref 150–400)
RBC: 3.26 MIL/uL — ABNORMAL LOW (ref 3.87–5.11)
RDW: 17.4 % — ABNORMAL HIGH (ref 11.5–15.5)
WBC: 10.4 10*3/uL (ref 4.0–10.5)
nRBC: 0 % (ref 0.0–0.2)

## 2019-08-27 LAB — BASIC METABOLIC PANEL
Anion gap: 9 (ref 5–15)
BUN: 7 mg/dL — ABNORMAL LOW (ref 8–23)
CO2: 19 mmol/L — ABNORMAL LOW (ref 22–32)
Calcium: 8.5 mg/dL — ABNORMAL LOW (ref 8.9–10.3)
Chloride: 106 mmol/L (ref 98–111)
Creatinine, Ser: 0.65 mg/dL (ref 0.44–1.00)
GFR calc Af Amer: >60 mL/min (ref >60)
GFR calc non Af Amer: >60 mL/min (ref >60)
Glucose, Bld: 105 mg/dL — ABNORMAL HIGH (ref 70–99)
Potassium: 4.2 mmol/L (ref 3.5–5.1)
Sodium: 134 mmol/L — ABNORMAL LOW (ref 135–145)

## 2019-08-27 LAB — GLUCOSE, CAPILLARY
Glucose-Capillary: 103 mg/dL — ABNORMAL HIGH (ref 70–99)
Glucose-Capillary: 97 mg/dL (ref 70–99)
Glucose-Capillary: 134 mg/dL — ABNORMAL HIGH (ref 70–99)

## 2019-08-27 MED ORDER — LEVOFLOXACIN 750 MG PO TABS: 750.0000 mg | ORAL_TABLET | Freq: Every day | ORAL | 0 refills | Status: AC

## 2019-08-27 MED ORDER — METRONIDAZOLE 500 MG PO TABS: 500.0000 mg | ORAL_TABLET | Freq: Three times a day (TID) | ORAL | 0 refills | Status: AC

## 2019-08-27 MED ORDER — SIMETHICONE 80 MG PO CHEW: 80.0000 mg | CHEWABLE_TABLET | Freq: Four times a day (QID) | ORAL | 0 refills | Status: AC | PRN

## 2019-08-27 MED ORDER — LEVOFLOXACIN 750 MG PO TABS
750.0000 mg | ORAL_TABLET | Freq: Every day | ORAL | Status: DC
  Administered 2019-08-27: 750 mg via ORAL
  Filled 2019-08-27: qty 1

## 2019-08-27 MED ORDER — METRONIDAZOLE 500 MG PO TABS: 500.0000 mg | ORAL_TABLET | Freq: Three times a day (TID) | ORAL | Status: DC

## 2019-08-27 NOTE — Discharge Instructions (Signed)
Cholangitis  Cholangitis is inflammation of the group of tubes (ducts) that carry digestive juices from the liver, gallbladder, and pancreas to the small intestine. This group of ducts is called the biliary tract. Cholangitis can cause fever, abdominal pain, and yellowish discoloration of the skin, the whites of the eyes, and mucous membranes (jaundice). Cholangitis can get worse very quickly and cause infection throughout the body (sepsis). It is important to diagnose and treat cholangitis as soon as possible. What are the causes? This condition is usually caused by a blockage (obstruction) in the biliary tract. The most common causes of obstruction are:  Formation of hard particles (stones) in the biliary tract.  Damage to the biliary tract from a previous surgical or diagnostic procedure. Other causes of an obstruction include:  Cysts or tumors in the biliary tract.  A type of liver disease that affects the biliary tract (primary sclerosing cholangitis).  Being born with a narrow biliary tract. When the flow of digestive juices is blocked, bacteria that normally live in the intestine can grow and spread inside the biliary tract. What increases the risk? The following factors may make you more likely to develop this condition:  Being 50?75 years old.  Having a history of stones in the biliary tract.  Having had cholangitis in the past.  Having HIV.  Having another condition that affects the biliary tract.  Having had a procedure to diagnose or treat problems with the biliary tract, especially endoscopic retrograde cholangiopancreatography (ERCP). These types of procedures may cause scarring and obstruction that can lead to infection. What are the signs or symptoms? The most common symptoms of this condition are fever, abdominal pain, and jaundice. Often, all of these symptoms are present. Other symptoms may include:  Chills.  Tiredness.  Nausea.  Dark-colored  urine.  Clay-colored stools.  Confusion.  Itchy skin. How is this diagnosed? This condition may be diagnosed based on:  Your symptoms.  A physical exam.  Your medical history. Your health care provider may ask whether you have had stones, ERCP, or other procedures involving the biliary tract in the past.  Blood tests.  Imaging studies, such as: ? An ultrasound. This uses sound waves to make an image of any obstructions that you have. ? An MRI. ? A CT scan.  ERCP to check the biliary tract for possible causes of cholangitis. During ERCP, a thin, lighted tube (endoscope) is passed through your mouth and down your throat into the first part of your small intestine (duodenum). A small, plastic tube (cannula) is then passed through the endoscope and directed into your bile duct or pancreatic duct. Dye is then injected through the cannula and X-rays are taken. How is this treated? This condition is usually treated at a hospital. Treatment may include:  Receiving fluids, nutrition, and antibiotic medicines through an IV line. You may be given antibiotics that kill most of the bacteria known to cause cholangitis (broad spectrumantibiotics).  ERCP or another surgical procedure to open and drain the biliary tract. Follow these instructions at home: Medicines  Take over-the-counter and prescription medicines only as told by your health care provider.  Take your antibiotic medicine as told by your health care provider. Do not stop taking the antibiotic even if you start to feel better. General instructions  Follow instructions from your health care provider about eating or drinking restrictions.  Maintain a healthy weight.  Keep all follow-up visits as told by your health care provider. This is important. Activity  Exercise   regularly, as told by your health care provider.  Return to your normal activities as told by your health care provider. Ask your health care provider what  activities are safe for you. Contact a health care provider if you:  Have symptoms that return or become more severe.  Suddenly lose weight. Get help right away if you:  Have a fever.  Have chills.  Have severe abdominal pain.  Feel dizzy or lightheaded. Summary  Cholangitis is inflammation of the group of tubes (ducts) that carry digestive juices from the liver, gallbladder, and pancreas to the small intestine.  This condition is usually caused by a blockage (obstruction) in the biliary tract.  The most common symptoms of this condition are fever, abdominal pain, and jaundice.  This condition is usually treated at a hospital. This information is not intended to replace advice given to you by your health care provider. Make sure you discuss any questions you have with your health care provider. Document Revised: 10/03/2017 Document Reviewed: 10/03/2017 Elsevier Patient Education  2020 [employer]  

## 2019-08-27 NOTE — Care Management Important Message (Signed)
Important Message  Patient Details  Name: Bethany Joseph MRN: [id number] Date of Birth: 1945/03/21   Medicare Important Message Given:  Yes     Olegario Messier A Korayma Hagwood 08/27/2019, 11:04 AM

## 2019-08-27 NOTE — Discharge Summary (Signed)
Physician Discharge Summary  Bethany Joseph XFQ:[id number] DOB: Dec 12, 1945 DOA: 08/23/2019  PCP: Guadalupe Maple, MD  Admit date: 08/23/2019 Discharge date: 08/27/2019  Admitted From: Home  Disposition:  Home  Recommendations for Outpatient Follow-up:  1. Follow up with PCP in 1-2 weeks 2.   Home Health:No Equipment/Devices:None Discharge Condition:Stable CODE STATUS: FULL Diet recommendation: Heart Healthy / Carb Modified Brief/Interim Summary: This is a 74 year old female with past medical history of type 2 diabetes, paroxysmal tachycardia and chronic atrial fibrillation, COPD, hypothyroidism, hyperlipidemia, hypertension, valvular disorder who presented to the ER on 7/11 with altered mental status, nausea and vomiting and abdominal pain.  ED course: Vital signs were temp 103 F, bp 131/73, hr 86 bpm, and O2 sats92% on RA.Lab results revealed Na+ 133, glucose 205, calcium 8.4, alk phos 480, albumin 3.2, AST 72, total bilirubin 4.1, troponin 26, lactic acid 2.4, wbc 16.9, hgb 11.6, and COVID-19 negative. [place] Abd limited revealed significant intrahepatic and extrahepatic biliary ductal dilatation raising concern for an underlying obstructing process. GI was consulted for MRCP/ERCP. She was admitted to the ICU under the PCCM service for sepsis secondary to cholangitis and started on broad-spectrum antibiotics. Cardiology was consulted for preop clearance  Overnight 7/13 patient had asymptomatic A. fib with bradycardia and long pauses and was given atropine x1.  7/14: Patient seen and examined.  Abdominal pain resolved.  Status post successful ERCP with stone retrieval yesterday.  Tolerated procedure well.  No complaints this morning.  Is hungry, wants to eat.  Heart rate improved.  Has not required atropine since early a.m. 08/25/2019  7/15: Patient seen and examined on the day of discharge.  Tolerating p.o. intake.  Minimal abdominal pain.  Complains of some gas.  Telemetry  reviewed.  No episodes of bradycardia noted.  Discussed with cardiology.  Plan to discharge home with instructions to stop all antiarrhythmics and rate control agents.  Patient okay to continue home Eliquis.  Follow-up with cardiology in 2 weeks.  Stable for discharge home  Discharge Diagnoses:  Active Problems:   Sepsis ([place])   Acute cholangitis   Choledocholithiasis   Stricture and stenosis of esophagus  Acute cholangitis Severe sepsis secondary to above Patient status post ERCP Successful retrieval of multiple stones Seen by GI post procedurally Continue antibiotics, will complete 5 days from procedure.  End date 7/20 Levaquin 750 daily, metronidazole 500 3 times daily Recommend bland foods and small meals on discharge  Atrial fibrillation with slow ventricular response and pauses Cardiology consulted Atropine x1 for bradycardia to high 20s Cardiology continue to follow No plans for pacemaker implantation at this time Case discussed with cardiology at time of discharge Stop all antiarrhythmics and AV nodal blocking agents Can resume home Eliquis Follow-up in cardiology clinic 2 weeks  Asthma Stable Continue as needed bronchodilators  GERD Pepcid  Type 2 diabetes Sliding scale insulin  Hypomagnesemia Hypokalemia Check and replete as needed  Discharge Instructions  Discharge Instructions    Diet - low sodium heart healthy   Complete by: As directed    Increase activity slowly   Complete by: As directed      Allergies as of 08/27/2019      Reactions   Neosporin [neomycin-polymyxin-gramicidin] Hives   Tape    Other reaction(s): Unknown Adhesive bandage   Codeine Rash   Latex Rash   Neomycin-bacitracin Zn-polymyx Rash   Other reaction(s): Unknown DERMATOLOGICALS   Penicillins Rash, Other (See Comments)   Has patient had a PCN reaction causing immediate rash, facial/tongue/throat  swelling, SOB or lightheadedness with hypotension: Unknown Has patient  had a PCN reaction causing severe rash involving mucus membranes or skin necrosis: Unknown Has patient had a PCN reaction that required hospitalization: Unknown Has patient had a PCN reaction occurring within the last 10 years: No If all of the above answers are "NO", then may proceed with Cephalosporin use.      Medication List    STOP taking these medications   aspirin 325 MG tablet   carbamide peroxide 6.5 % OTIC solution Commonly known as: DEBROX   digoxin 0.125 MG tablet Commonly known as: LANOXIN   furosemide 20 MG tablet Commonly known as: LASIX   nebivolol 2.5 MG tablet Commonly known as: BYSTOLIC   oxyCODONE 5 MG immediate release tablet Commonly known as: Oxy IR/ROXICODONE     TAKE these medications   acetaminophen 325 MG tablet Commonly known as: TYLENOL Take 650 mg by mouth every 4 (four) hours as needed.   ALIGN PREBIOTIC-PROBIOTIC PO Take 1 capsule by mouth daily. Notes to patient: Not given this hospitalization   bisacodyl 10 MG suppository Commonly known as: DULCOLAX Place 10 mg rectally as needed for moderate constipation. Notes to patient: Not given this hospitalization   Cholecalciferol 100 MCG (4000 UT) Caps Take 1 capsule by mouth daily. Notes to patient: Not given this hospitalization   doxepin 10 MG capsule Commonly known as: SINEQUAN Take 10 mg by mouth at bedtime.   Eliquis 5 MG Tabs tablet Generic drug: apixaban Take 5 mg by mouth every 12 (twelve) hours. Notes to patient: Not given this hospitalization   gabapentin 400 MG capsule Commonly known as: NEURONTIN Take 400 mg by mouth at bedtime.   gabapentin 100 MG capsule Commonly known as: NEURONTIN Take 100 mg by mouth daily.   HYDROcodone-acetaminophen 5-325 MG tablet Commonly known as: NORCO/VICODIN Take 1 tablet by mouth every 4 (four) hours as needed.   levofloxacin 750 MG tablet Commonly known as: LEVAQUIN Take 1 tablet (750 mg total) by mouth daily for 4 days.    levothyroxine 25 MCG tablet Commonly known as: SYNTHROID Take 25 mcg by mouth daily before breakfast. Notes to patient: Not given this hospitalization   liothyronine 25 MCG tablet Commonly known as: CYTOMEL Take 100 mcg by mouth daily. 4 tabs Notes to patient: Not given this hospitalization   loratadine 10 MG tablet Commonly known as: CLARITIN Take 10 mg by mouth daily. Notes to patient: Not given this hospitalization   metFORMIN 500 MG 24 hr tablet Commonly known as: GLUCOPHAGE-XR Take 1,000 mg by mouth every evening. Notes to patient: Not given this hospitalization   metroNIDAZOLE 500 MG tablet Commonly known as: FLAGYL Take 1 tablet (500 mg total) by mouth every 8 (eight) hours for 4 days.   Omega 3 1200 MG Caps Take 1,200 mg by mouth daily. Notes to patient: Not given this hospitalization   omeprazole 40 MG capsule Commonly known as: PRILOSEC TAKE ONE CAPSULE BY MOUTH DAILY Notes to patient: protonix given in hospital   potassium chloride SA 20 MEQ tablet Commonly known as: KLOR-CON Take 20 mEq by mouth daily.   pravastatin 20 MG tablet Commonly known as: PRAVACHOL Take 20 mg by mouth.   ProAir HFA 108 (90 Base) MCG/ACT inhaler Generic drug: albuterol USE 2 PUFFS TWICE DAILY AS NEEDED shortness of breath Notes to patient: Not given this hospitalization   raloxifene 60 MG tablet Commonly known as: EVISTA TAKE ONE (1) TABLET BY MOUTH EVERY DAY   sennosides-docusate sodium  8.6-50 MG tablet Commonly known as: SENOKOT-S Take 2 tablets by mouth 2 (two) times daily. Notes to patient: Not given this hospitalization   simethicone 80 MG chewable tablet Commonly known as: MYLICON Chew 1 tablet (80 mg total) by mouth every 6 (six) hours as needed for flatulence.   torsemide 20 MG tablet Commonly known as: DEMADEX Take 20 mg by mouth 2 (two) times daily. Notes to patient: Not given this hospitalization   vitamin E 180 MG (400 UNITS) capsule Take 400 Units by  mouth 3 (three) times daily. Notes to patient: Not given this hospitalization   zolpidem 5 MG tablet Commonly known as: AMBIEN Take 5 mg by mouth at bedtime as needed for sleep.       Follow-up Information    Guadalupe Maple, MD. Schedule an appointment as soon as possible for a visit on 09/03/2019.   Specialty: Internal Medicine Why: Dr.Reardon doesnt have availability for 1 wk. pt will see Dr. Nadyne Coombes @ 8:20a Contact information: [facility] [place] [address] [phone]        Yolonda Kida, MD On 09/09/2019.   Specialties: Cardiology, Internal Medicine Why: @8 :45a Contact information: [place] [address] [phone]              Allergies  Allergen Reactions  . Neosporin [Neomycin-Polymyxin-Gramicidin] Hives  . Tape     Other reaction(s): Unknown Adhesive bandage  . Codeine Rash  . Latex Rash  . Neomycin-Bacitracin Zn-Polymyx Rash    Other reaction(s): Unknown DERMATOLOGICALS   . Penicillins Rash and Other (See Comments)    Has patient had a PCN reaction causing immediate rash, facial/tongue/throat swelling, SOB or lightheadedness with hypotension: Unknown Has patient had a PCN reaction causing severe rash involving mucus membranes or skin necrosis: Unknown Has patient had a PCN reaction that required hospitalization: Unknown Has patient had a PCN reaction occurring within the last 10 years: No If all of the above answers are "NO", then may proceed with Cephalosporin use.     Consultations:  Cardiology-[facility]   Procedures/Studies: CT Head Wo Contrast  Result Date: 08/23/2019 CLINICAL DATA:  Altered level of consciousness, combative, frequent falls EXAM: CT HEAD WITHOUT CONTRAST CT CERVICAL SPINE WITHOUT CONTRAST TECHNIQUE: Multidetector CT imaging of the head and cervical spine was performed following the standard protocol without intravenous contrast.  Multiplanar CT image reconstructions of the cervical spine were also generated. COMPARISON:  11/01/2009 FINDINGS: CT HEAD FINDINGS Brain: No acute infarct or hemorrhage. Lateral ventricles and midline structures are unremarkable. There is moderate diffuse cerebral atrophy. No acute extra-axial fluid collections. No mass effect. Vascular: No hyperdense vessel or unexpected calcification. Skull: Normal. Negative for fracture or focal lesion. Sinuses/Orbits: No acute finding. Other: None. CT CERVICAL SPINE FINDINGS Alignment: Alignment is anatomic. Skull base and vertebrae: No acute displaced fractures. Soft tissues and spinal canal: No prevertebral fluid or swelling. No visible canal hematoma. Disc levels: Mild mid cervical facet hypertrophy. No significant spondylosis or compressive sequela. Upper chest: Airway is patent. Lung apices are clear. Other: Reconstructed images demonstrate no additional findings. IMPRESSION: 1. No acute intracranial process. 2. No acute cervical spine fracture. Electronically Signed   By: Randa Ngo M.D.   On: 08/23/2019 18:35   CT Cervical Spine Wo Contrast  Result Date: 08/23/2019 CLINICAL DATA:  Altered level of consciousness, combative, frequent falls EXAM: CT HEAD WITHOUT CONTRAST CT CERVICAL SPINE WITHOUT CONTRAST TECHNIQUE: Multidetector CT imaging of  the head and cervical spine was performed following the standard protocol without intravenous contrast. Multiplanar CT image reconstructions of the cervical spine were also generated. COMPARISON:  11/01/2009 FINDINGS: CT HEAD FINDINGS Brain: No acute infarct or hemorrhage. Lateral ventricles and midline structures are unremarkable. There is moderate diffuse cerebral atrophy. No acute extra-axial fluid collections. No mass effect. Vascular: No hyperdense vessel or unexpected calcification. Skull: Normal. Negative for fracture or focal lesion. Sinuses/Orbits: No acute finding. Other: None. CT CERVICAL SPINE FINDINGS Alignment:  Alignment is anatomic. Skull base and vertebrae: No acute displaced fractures. Soft tissues and spinal canal: No prevertebral fluid or swelling. No visible canal hematoma. Disc levels: Mild mid cervical facet hypertrophy. No significant spondylosis or compressive sequela. Upper chest: Airway is patent. Lung apices are clear. Other: Reconstructed images demonstrate no additional findings. IMPRESSION: 1. No acute intracranial process. 2. No acute cervical spine fracture. Electronically Signed   By: Randa Ngo M.D.   On: 08/23/2019 18:35   MR 3D Recon At Scanner  Result Date: 08/23/2019 CLINICAL DATA:  Right upper quadrant pain.  Biliary dilatation. EXAM: MRI ABDOMEN WITHOUT AND WITH CONTRAST (INCLUDING MRCP) TECHNIQUE: Multiplanar multisequence MR imaging of the abdomen was performed both before and after the administration of intravenous contrast. Heavily T2-weighted images of the biliary and pancreatic ducts were obtained, and three-dimensional MRCP images were rendered by post processing. CONTRAST:  7.59m GADAVIST GADOBUTROL 1 MMOL/ML IV SOLN COMPARISON:  Right upper quadrant ultrasound same day FINDINGS: Lower chest: Cardiomegaly Hepatobiliary: There is severe intrahepatic and extrahepatic biliary dilatation. The common bile duct measures 17 mm. There is a large stone within the distal common bile duct (series 3, image 15). Status post cholecystectomy. No focal liver lesion. Pancreas: No mass, inflammatory changes, or other parenchymal abnormality identified. Spleen:  Within normal limits in size and appearance. Adrenals/Urinary Tract: No masses identified. No evidence of hydronephrosis. Stomach/Bowel: Visualized portions within the abdomen are unremarkable. Vascular/Lymphatic: No pathologically enlarged lymph nodes identified. No abdominal aortic aneurysm demonstrated. Other:  None. Musculoskeletal: No suspicious bone lesions identified. IMPRESSION: Severe intrahepatic and extrahepatic biliary dilatation  with large stone within the distal common bile duct. Electronically Signed   By: KUlyses JarredM.D.   On: 08/23/2019 22:49   DG Chest Port 1 View  Result Date: 08/23/2019 CLINICAL DATA:  Fever, altered level of consciousness EXAM: PORTABLE CHEST 1 VIEW COMPARISON:  03/12/2018 FINDINGS: Single frontal view of the chest demonstrates an unremarkable cardiac silhouette. There is increased central vascular congestion, with mild diffuse interstitial prominence. No focal consolidation, effusion, or pneumothorax. No acute bony abnormalities. IMPRESSION: 1. Increased central vascular congestion. 2. No acute airspace disease. Electronically Signed   By: MRanda NgoM.D.   On: 08/23/2019 18:15   DG C-Arm 1-60 Min-No Report  Result Date: 08/25/2019 Fluoroscopy was utilized by the requesting physician.  No radiographic interpretation.   MR ABDOMEN MRCP W WO CONTAST  Result Date: 08/23/2019 CLINICAL DATA:  Right upper quadrant pain.  Biliary dilatation. EXAM: MRI ABDOMEN WITHOUT AND WITH CONTRAST (INCLUDING MRCP) TECHNIQUE: Multiplanar multisequence MR imaging of the abdomen was performed both before and after the administration of intravenous contrast. Heavily T2-weighted images of the biliary and pancreatic ducts were obtained, and three-dimensional MRCP images were rendered by post processing. CONTRAST:  7.598mGADAVIST GADOBUTROL 1 MMOL/ML IV SOLN COMPARISON:  Right upper quadrant ultrasound same day FINDINGS: Lower chest: Cardiomegaly Hepatobiliary: There is severe intrahepatic and extrahepatic biliary dilatation. The common bile duct measures 17 mm. There is a large  stone within the distal common bile duct (series 3, image 15). Status post cholecystectomy. No focal liver lesion. Pancreas: No mass, inflammatory changes, or other parenchymal abnormality identified. Spleen:  Within normal limits in size and appearance. Adrenals/Urinary Tract: No masses identified. No evidence of hydronephrosis. Stomach/Bowel:  Visualized portions within the abdomen are unremarkable. Vascular/Lymphatic: No pathologically enlarged lymph nodes identified. No abdominal aortic aneurysm demonstrated. Other:  None. Musculoskeletal: No suspicious bone lesions identified. IMPRESSION: Severe intrahepatic and extrahepatic biliary dilatation with large stone within the distal common bile duct. Electronically Signed   By: Ulyses Jarred M.D.   On: 08/23/2019 22:49   US ABDOMEN LIMITED RUQ  Result Date: 08/23/2019 CLINICAL DATA:  Pain.  Fever and sepsis with elevated LFTs. EXAM: ULTRASOUND ABDOMEN LIMITED RIGHT UPPER QUADRANT COMPARISON:  None. FINDINGS: Gallbladder: The patient is status post prior cholecystectomy. Common bile duct: Diameter: The common bile duct is significantly dilated measuring approximately 1.5 cm proximally tapering to approximately 0.9 cm distally. Liver: There is significant intrahepatic biliary ductal dilatation. Portal vein is patent on color Doppler imaging with normal direction of blood flow towards the liver. Other: None. IMPRESSION: 1. Status post cholecystectomy. 2. There is significant intrahepatic and extrahepatic biliary ductal dilatation raising concern for an underlying obstructing process. Follow-up with MRCP/ERCP is recommended. Electronically Signed   By: Constance Holster M.D.   On: 08/23/2019 19:50    (Echo, Carotid, EGD, Colonoscopy, ERCP)    Subjective: Patient seen and examined on day of discharge.  Stable, no distress.  Not much appetite.  Some gas pain.  Otherwise asymptomatic.  Heart rate controlled.  Stable for discharge.  Discharge Exam: Vitals:   08/27/19 0812 08/27/19 1141  BP: 137/71 (!) 184/90  Pulse: 61 (!) 59  Resp: 17 18  Temp: 98.1 F (36.7 C) 98.2 F (36.8 C)  SpO2: 100% 96%   Vitals:   08/26/19 2352 08/27/19 0333 08/27/19 0812 08/27/19 1141  BP:  (!) 142/86 137/71 (!) 184/90  Pulse: 72 (!) 47 61 (!) 59  Resp:  18 17 18   Temp:  97.8 F (36.6 C) 98.1 F (36.7 C)  98.2 F (36.8 C)  TempSrc:  Oral  Oral  SpO2: 97% 99% 100% 96%  Weight:      Height:        General: Pt is alert, awake, not in acute distress Cardiovascular: Regular rate, regular rhythm, no murmurs Respiratory: CTA bilaterally, no wheezing, no rhonchi Abdominal: Soft, NT, ND, bowel sounds + Extremities: no edema, no cyanosis    The results of significant diagnostics from this hospitalization (including imaging, microbiology, ancillary and laboratory) are listed below for reference.     Microbiology: Recent Results (from the past 240 hour(s))  Blood Culture (routine x 2)     Status: None (Preliminary result)   Collection Time: 08/23/19  5:46 PM   Specimen: BLOOD  Result Value Ref Range Status   Specimen Description BLOOD BLOOD LEFT FOREARM  Final   Special Requests   Final    BOTTLES DRAWN AEROBIC AND ANAEROBIC Blood Culture adequate volume   Culture   Final    NO GROWTH 4 DAYS Performed at [facility], [address] [place], [place] [address]    Report Status PENDING  Incomplete  Blood Culture (routine x 2)     Status: None (Preliminary result)   Collection Time: 08/23/19  5:46 PM   Specimen: BLOOD  Result Value Ref Range Status   Specimen Description BLOOD LEFT ANTECUBITAL  Final  Special Requests   Final    BOTTLES DRAWN AEROBIC AND ANAEROBIC Blood Culture adequate volume   Culture   Final    NO GROWTH 4 DAYS Performed at [facility], [address] [place], [place] [address]    Report Status PENDING  Incomplete  SARS Coronavirus 2 by RT PCR (hospital order, performed in [facility] hospital lab) Nasopharyngeal Nasopharyngeal Swab     Status: None   Collection Time: 08/23/19  5:46 PM   Specimen: Nasopharyngeal Swab  Result Value Ref Range Status   SARS Coronavirus 2 NEGATIVE NEGATIVE Final    Comment: (NOTE) SARS-CoV-2 target nucleic acids are NOT DETECTED.  The SARS-CoV-2 RNA is generally detectable in upper and lower respiratory  specimens during the acute phase of infection. The lowest concentration of SARS-CoV-2 viral copies this assay can detect is 250 copies / mL. A negative result does not preclude SARS-CoV-2 infection and should not be used as the sole basis for treatment or other patient management decisions.  A negative result may occur with improper specimen collection / handling, submission of specimen other than nasopharyngeal swab, presence of viral mutation(s) within the areas targeted by this assay, and inadequate number of viral copies (<250 copies / mL). A negative result must be combined with clinical observations, patient history, and epidemiological information.  Fact Sheet for Patients:   StrictlyIdeas.no  Fact Sheet for Healthcare Providers: BankingDealers.co.za  This test is not yet approved or  cleared by the [place] FDA and has been authorized for detection and/or diagnosis of SARS-CoV-2 by FDA under an Emergency Use Authorization (EUA).  This EUA will remain in effect (meaning this test can be used) for the duration of the COVID-19 declaration under Section 564(b)(1) of the Act, 21 U.S.C. section 360bbb-3(b)(1), unless the authorization is terminated or revoked sooner.  Performed at [facility], [address] [place], [place] [address]   Urine culture     Status: Abnormal   Collection Time: 08/23/19  8:23 PM   Specimen: Urine, Random  Result Value Ref Range Status   Specimen Description   Final    URINE, RANDOM Performed at [facility], [address] [place], [place] [address]    Special Requests   Final    NONE Performed at [facility], [place], [place] [address]    Culture 40,000 COLONIES/mL ENTEROCOCCUS FAECALIS (A)  Final   Report Status 08/26/2019 FINAL  Final   Organism ID, Bacteria ENTEROCOCCUS FAECALIS (A)  Final      Susceptibility   Enterococcus faecalis - MIC*     AMPICILLIN <=2 SENSITIVE Sensitive     NITROFURANTOIN <=16 SENSITIVE Sensitive     VANCOMYCIN 1 SENSITIVE Sensitive     * 40,000 COLONIES/mL ENTEROCOCCUS FAECALIS  MRSA PCR Screening     Status: None   Collection Time: 08/24/19  4:00 PM   Specimen: Nasal Mucosa; Nasopharyngeal  Result Value Ref Range Status   MRSA by PCR NEGATIVE NEGATIVE Final    Comment:        The GeneXpert MRSA Assay (FDA approved for NASAL specimens only), is one component of a comprehensive MRSA colonization surveillance program. It is not intended to diagnose MRSA infection nor to guide or monitor treatment for MRSA infections. Performed at [facility], [address] [place],  [address]      Labs: BNP (last 3 results) No results for input(s): BNP in the last 8760 hours. Basic Metabolic Panel: Recent Labs  Lab 08/23/19 1746 08/23/19 2023 08/24/19 0432 08/25/19 0447 08/26/19 0420 08/27/19 0718  NA 133*  --  140 138 136 134*  K 3.8  --  3.2* 4.3 5.3* 4.2  CL 98  --  110 109 110 106  CO2 22  --  26 24 22  19*  GLUCOSE 205*  --  127* 113* 160* 105*  BUN 16  --  14 9 11  7*  CREATININE 0.75  --  0.72 0.57 0.73 0.65  CALCIUM 8.4*  --  6.8* 8.5* 8.3* 8.5*  MG  --  0.7* 2.5* 1.9 1.9  --   PHOS  --  2.7 3.9  --   --   --    Liver Function Tests: Recent Labs  Lab 08/23/19 1746 08/24/19 0432 08/25/19 0447  AST 72* 68* 40  ALT 37 33 29  ALKPHOS 480* 362* 356*  BILITOT 4.1* 4.1* 3.3*  PROT 8.3* 6.2* 6.5  ALBUMIN 3.2* 2.3* 2.5*   Recent Labs  Lab 08/23/19 2000  LIPASE 19   No results for input(s): AMMONIA in the last 168 hours. CBC: Recent Labs  Lab 08/23/19 1746 08/24/19 0432 08/25/19 0448 08/26/19 0420 08/27/19 0718  WBC 16.9* 11.6* 8.0 11.2* 10.4  NEUTROABS 15.2* 9.4*  --   --  7.7  HGB 11.6* 9.1* 9.3* 9.8* 9.7*  HCT 33.9* 27.5* 29.1* 30.2* 28.4*  MCV 87.4 90.8 92.4 91.5 87.1  PLT 358 254 260 284 266   Cardiac Enzymes: No results for input(s): CKTOTAL,  CKMB, CKMBINDEX, TROPONINI in the last 168 hours. BNP: Invalid input(s): POCBNP CBG: Recent Labs  Lab 08/26/19 1616 08/26/19 2045 08/27/19 0507 08/27/19 0830 08/27/19 1140  GLUCAP 161* 170* 103* 97 134*   D-Dimer No results for input(s): DDIMER in the last 72 hours. Hgb A1c No results for input(s): HGBA1C in the last 72 hours. Lipid Profile No results for input(s): CHOL, HDL, LDLCALC, TRIG, CHOLHDL, LDLDIRECT in the last 72 hours. Thyroid function studies No results for input(s): TSH, T4TOTAL, T3FREE, THYROIDAB in the last 72 hours.  Invalid input(s): FREET3 Anemia work up No results for input(s): VITAMINB12, FOLATE, FERRITIN, TIBC, IRON, RETICCTPCT in the last 72 hours. Urinalysis    Component Value Date/Time   COLORURINE AMBER (A) 08/23/2019 2023   APPEARANCEUR HAZY (A) 08/23/2019 2023   LABSPEC 1.010 08/23/2019 2023   PHURINE 5.0 08/23/2019 2023   GLUCOSEU NEGATIVE 08/23/2019 2023   HGBUR NEGATIVE 08/23/2019 2023   BILIRUBINUR NEGATIVE 08/23/2019 2023   KETONESUR NEGATIVE 08/23/2019 2023   PROTEINUR NEGATIVE 08/23/2019 2023   NITRITE NEGATIVE 08/23/2019 2023   LEUKOCYTESUR TRACE (A) 08/23/2019 2023   Sepsis Labs Invalid input(s): PROCALCITONIN,  WBC,  [facility] Microbiology Recent Results (from the past 240 hour(s))  Blood Culture (routine x 2)     Status: None (Preliminary result)   Collection Time: 08/23/19  5:46 PM   Specimen: BLOOD  Result Value Ref Range Status   Specimen Description BLOOD BLOOD LEFT FOREARM  Final   Special Requests   Final    BOTTLES DRAWN AEROBIC AND ANAEROBIC Blood Culture adequate volume   Culture   Final    NO GROWTH 4 DAYS Performed at [facility], [address] [place], [place] [address]    Report Status PENDING  Incomplete  Blood Culture (routine x 2)     Status: None (Preliminary result)   Collection Time: 08/23/19  5:46 PM   Specimen: BLOOD  Result Value Ref Range Status   Specimen Description BLOOD  LEFT  ANTECUBITAL  Final   Special Requests   Final    BOTTLES DRAWN AEROBIC AND ANAEROBIC Blood Culture adequate volume   Culture   Final    NO GROWTH 4 DAYS Performed at [facility], [address] [place], [place] [address]    Report Status PENDING  Incomplete  SARS Coronavirus 2 by RT PCR (hospital order, performed in [facility] hospital lab) Nasopharyngeal Nasopharyngeal Swab     Status: None   Collection Time: 08/23/19  5:46 PM   Specimen: Nasopharyngeal Swab  Result Value Ref Range Status   SARS Coronavirus 2 NEGATIVE NEGATIVE Final    Comment: (NOTE) SARS-CoV-2 target nucleic acids are NOT DETECTED.  The SARS-CoV-2 RNA is generally detectable in upper and lower respiratory specimens during the acute phase of infection. The lowest concentration of SARS-CoV-2 viral copies this assay can detect is 250 copies / mL. A negative result does not preclude SARS-CoV-2 infection and should not be used as the sole basis for treatment or other patient management decisions.  A negative result may occur with improper specimen collection / handling, submission of specimen other than nasopharyngeal swab, presence of viral mutation(s) within the areas targeted by this assay, and inadequate number of viral copies (<250 copies / mL). A negative result must be combined with clinical observations, patient history, and epidemiological information.  Fact Sheet for Patients:   StrictlyIdeas.no  Fact Sheet for Healthcare Providers: BankingDealers.co.za  This test is not yet approved or  cleared by the [place] FDA and has been authorized for detection and/or diagnosis of SARS-CoV-2 by FDA under an Emergency Use Authorization (EUA).  This EUA will remain in effect (meaning this test can be used) for the duration of the COVID-19 declaration under Section 564(b)(1) of the Act, 21 U.S.C. section 360bbb-3(b)(1), unless the authorization is  terminated or revoked sooner.  Performed at [facility], [address] [place], [place] [address]   Urine culture     Status: Abnormal   Collection Time: 08/23/19  8:23 PM   Specimen: Urine, Random  Result Value Ref Range Status   Specimen Description   Final    URINE, RANDOM Performed at [facility], [address] [place], [place] [address]    Special Requests   Final    NONE Performed at [facility], [place], [place] [address]    Culture 40,000 COLONIES/mL ENTEROCOCCUS FAECALIS (A)  Final   Report Status 08/26/2019 FINAL  Final   Organism ID, Bacteria ENTEROCOCCUS FAECALIS (A)  Final      Susceptibility   Enterococcus faecalis - MIC*    AMPICILLIN <=2 SENSITIVE Sensitive     NITROFURANTOIN <=16 SENSITIVE Sensitive     VANCOMYCIN 1 SENSITIVE Sensitive     * 40,000 COLONIES/mL ENTEROCOCCUS FAECALIS  MRSA PCR Screening     Status: None   Collection Time: 08/24/19  4:00 PM   Specimen: Nasal Mucosa; Nasopharyngeal  Result Value Ref Range Status   MRSA by PCR NEGATIVE NEGATIVE Final    Comment:        The GeneXpert MRSA Assay (FDA approved for NASAL specimens only), is one component of a comprehensive MRSA colonization surveillance program. It is not intended to diagnose MRSA infection nor to guide or monitor treatment for MRSA infections. Performed at [facility], [address], [place],  [address]      Time coordinating discharge: Over 30 minutes  SIGNED:   Sidney Ace, MD  [facility] 08/27/2019, 11:58 AM Pager   If 7PM-7AM, please contact night-coverage

## 2019-08-27 NOTE — Progress Notes (Signed)
[facility] Cardiology    SUBJECTIVE: Patient states he feels much better now denies any significant chest pain no palpitations no tachycardia denies any pain or fever heart rate much improved in the 50s and 60s now patient asymptomatic   Vitals:   08/26/19 2324 08/26/19 2352 08/27/19 0333 08/27/19 0812  BP: (!) 153/80  (!) 142/86 137/71  Pulse: (!) 33 72 (!) 47 61  Resp: 18  18 17   Temp: 97.8 F (36.6 C)  97.8 F (36.6 C) 98.1 F (36.7 C)  TempSrc: Oral  Oral   SpO2: 100% 97% 99% 100%  Weight:      Height:         Intake/Output Summary (Last 24 hours) at 08/27/2019 1017 Last data filed at 08/27/2019 08/29/2019 Gross per 24 hour  Intake 1565.95 ml  Output 1200 ml  Net 365.95 ml      PHYSICAL EXAM  General: Well developed, well nourished, in no acute distress HEENT:  Normocephalic and atramatic Neck:  No JVD.  Lungs: Clear bilaterally to auscultation and percussion. Heart: Bradycardic. Normal S1 and S2 without gallops or murmurs.  Abdomen: Bowel sounds are positive, abdomen soft and non-tender  Msk:  Back normal, normal gait. Normal strength and tone for age. Extremities: No clubbing, cyanosis or edema.   Neuro: Alert and oriented X 3. Psych:  Good affect, responds appropriately   LABS: Basic Metabolic Panel: Recent Labs    08/25/19 0447 08/25/19 0447 08/26/19 0420 08/27/19 0718  NA 138   < > 136 134*  K 4.3   < > 5.3* 4.2  CL 109   < > 110 106  CO2 24   < > 22 19*  GLUCOSE 113*   < > 160* 105*  BUN 9   < > 11 7*  CREATININE 0.57   < > 0.73 0.65  CALCIUM 8.5*   < > 8.3* 8.5*  MG 1.9  --  1.9  --    < > = values in this interval not displayed.   Liver Function Tests: Recent Labs    08/25/19 0447  AST 40  ALT 29  ALKPHOS 356*  BILITOT 3.3*  PROT 6.5  ALBUMIN 2.5*   No results for input(s): LIPASE, AMYLASE in the last 72 hours. CBC: Recent Labs    08/26/19 0420 08/27/19 0718  WBC 11.2* 10.4  NEUTROABS  --  7.7  HGB 9.8* 9.7*  HCT 30.2* 28.4*  MCV 91.5  87.1  PLT 284 266   Cardiac Enzymes: No results for input(s): CKTOTAL, CKMB, CKMBINDEX, TROPONINI in the last 72 hours. BNP: Invalid input(s): POCBNP D-Dimer: No results for input(s): DDIMER in the last 72 hours. Hemoglobin A1C: No results for input(s): HGBA1C in the last 72 hours. Fasting Lipid Panel: No results for input(s): CHOL, HDL, LDLCALC, TRIG, CHOLHDL, LDLDIRECT in the last 72 hours. Thyroid Function Tests: No results for input(s): TSH, T4TOTAL, T3FREE, THYROIDAB in the last 72 hours.  Invalid input(s): FREET3 Anemia Panel: No results for input(s): VITAMINB12, FOLATE, FERRITIN, TIBC, IRON, RETICCTPCT in the last 72 hours.  DG C-Arm 1-60 Min-No Report  Result Date: 08/25/2019 Fluoroscopy was utilized by the requesting physician.  No radiographic interpretation.     Echo normal left ventricular function ejection fraction of at least 55%  TELEMETRY: Atrial fibrillation bradycardia rate between 55 and 65 which is improved  ASSESSMENT AND PLAN:  Active Problems:   Sepsis (HCC)   Acute cholangitis   Choledocholithiasis   Stricture and stenosis of esophagus Bradycardia  Atrial fibrillation  Plan Recommend conservative cardiac input No clear indication for permanent pacemaker at this point Will have the patient follow-up with cardiology as an outpatient Continue long-term anticoagulation for A. Fib Discontinue all rate blocking drugs which may exacerbate her bradycardia Consider Holter monitor as an outpatient for evaluation of severe bradycardia rates in the 30s or high-grade block or long pauses or tachycardia any of the above will precipitate the need for permanent pacemaker Diabetes reasonably controlled continue current management follow-up with primary physician Cholangitis seems to be improved status post surgical drainage percutaneously continue broad-spectrum antibiotic therapy Recommend have patient increase activity ambulate in halls in anticipation of  discharge home   Alwyn Pea, MD, 08/27/2019 10:17 AM

## 2019-08-27 NOTE — Evaluation (Signed)
Physical Therapy Evaluation Patient Details Name: Bethany Joseph MRN: [id number] DOB: 1945/06/28 Today's Date: 08/27/2019   History of Present Illness  74 year old female who presented to ED on 7/11 with altered mental status, nausea and vomiting and abdominal pain.  Pt admitted for sepsis secondary to cholangitis and started on broad-spectrum antibiotics.  Pt is s/p ERCP on 7/14.  Pt's PMH includes diabetes, Afib, HTN, and COPD.  Patient had a cholecystectomy roughly 10 years ago.  Clinical Impression  Pt did not want to do much and was perseverating on discharging as quickly as possible.  Agreed to work with PT simply to be able to avoid any delay in discharge.  She was essentially at/near her baseline and she and husband felt good about being able to manage at home with the equipment and assistance available.      Follow Up Recommendations No PT follow up    Equipment Recommendations  None recommended by PT    Recommendations for Other Services       Precautions / Restrictions Precautions Precautions: Fall (modrate) Restrictions Weight Bearing Restrictions: No      Mobility  Bed Mobility Overal bed mobility: Modified Independent             General bed mobility comments: extra time and effort, HOB elevated  Transfers Overall transfer level: Modified independent Equipment used: Rolling walker (2 wheeled) Transfers: Sit to/from Stand Sit to Stand: From elevated surface;Supervision         General transfer comment: Pt able to rise w/o assist from elevated surface without phyiscal assist  Ambulation/Gait Ambulation/Gait assistance: Modified independent (Device/Increase time) Gait Distance (Feet): 65 Feet Assistive device: Rolling walker (2 wheeled)       General Gait Details: Pt with slow, limping gait but reports (and husband confirms) being close to baseline with RW.  Stairs            Wheelchair Mobility    Modified Rankin (Stroke Patients Only)        Balance Overall balance assessment: Modified Independent Sitting-balance support: Feet supported;No upper extremity supported Sitting balance-Leahy Scale: Good     Standing balance support: Bilateral upper extremity supported Standing balance-Leahy Scale: Fair Standing balance comment: Pt able to complete marches in place with BUE support from RW, no LOB observed.  Pt refused additional balance or mobility testing.                             Pertinent Vitals/Pain Pain Assessment: No/denies pain    Home Living Family/patient expects to be discharged to:: Private residence Living Arrangements: Spouse/significant other Available Help at Discharge: Family;Available 24 hours/day Type of Home: House Home Access: Stairs to enter Entrance Stairs-Rails: Doctor, general practice of Steps: 2 Home Layout: Two level;Able to live on main level with bedroom/bathroom Home Equipment: Dan Humphreys - 2 wheels;Shower seat - built in;Grab bars - tub/shower Additional Comments: Home setup and equipment difficult to assess 2/2 pt refusal to answer    Prior Function Level of Independence: Independent with assistive device(s)         Comments: Reports she has not been out of the house much since the start of pandemic, relatively minimal in-home ambulation     Hand Dominance   Dominant Hand: Right    Extremity/Trunk Assessment   Upper Extremity Assessment Upper Extremity Assessment: Overall WFL for tasks assessed    Lower Extremity Assessment Lower Extremity Assessment: Overall WFL for tasks assessed  Cervical / Trunk Assessment Cervical / Trunk Assessment: Normal  Communication   Communication: No difficulties  Cognition Arousal/Alertness: Awake/alert Behavior During Therapy: Agitated;WFL for tasks assessed/performed Overall Cognitive Status: Within Functional Limits for tasks assessed                                 General Comments: Pt very  irritable but appears grossly oriented      General Comments General comments (skin integrity, edema, etc.): RN removing telemetry and IV during session    Exercises Other Exercises Other Exercises: provided education re: OT role and plan of care, fall and safety precautions, functional transfer practice with RW   Assessment/Plan    PT Assessment Patent does not need any further PT services  PT Problem List         PT Treatment Interventions      PT Goals (Current goals can be found in the Care Plan section)  Acute Rehab PT Goals Patient Stated Goal: to go home PT Goal Formulation: Patient unable to participate in goal setting    Frequency     Barriers to discharge        Co-evaluation               AM-PAC PT "6 Clicks" Mobility  Outcome Measure Help needed turning from your back to your side while in a flat bed without using bedrails?: None Help needed moving from lying on your back to sitting on the side of a flat bed without using bedrails?: None Help needed moving to and from a bed to a chair (including a wheelchair)?: None Help needed standing up from a chair using your arms (e.g., wheelchair or bedside chair)?: None Help needed to walk in hospital room?: None Help needed climbing 3-5 steps with a railing? : None 6 Click Score: 24    End of Session Equipment Utilized During Treatment: Gait belt Activity Tolerance: Patient tolerated treatment well Patient left: with call bell/phone within reach;in chair;with family/visitor present Nurse Communication: Mobility status PT Visit Diagnosis: Muscle weakness (generalized) (M62.81);Difficulty in walking, not elsewhere classified (R26.2)    Time: 4481-8563 PT Time Calculation (min) (ACUTE ONLY): 19 min   Charges:   PT Evaluation $PT Eval Low Complexity: 1 Low          Malachi Pro, DPT 08/27/2019, 11:39 AM

## 2019-08-27 NOTE — Progress Notes (Signed)
PHARMACY - PHYSICIAN COMMUNICATION CRITICAL VALUE ALERT - BLOOD CULTURE IDENTIFICATION (BCID)  Bethany Joseph is an 74 y.o. female who presented to [facility] on 08/23/2019 with a chief complaint of sepsi  Assessment:  Kleb pneumo in 1 of 4 bottles, pt discharged on levaquin 750 mg PO daily X 4 days.  (include suspected source if known)  Name of physician (or Provider) Contacted: Sreenath  Current antibiotics: levaquin 750 mg PO daily X 4   Changes to prescribed antibiotics recommended:  Continue current abx  Results for orders placed or performed during the hospital encounter of 08/23/19  Blood Culture ID Panel (Reflexed) (Collected: 08/23/2019  5:46 PM)  Result Value Ref Range   Enterococcus species NOT DETECTED NOT DETECTED   Listeria monocytogenes NOT DETECTED NOT DETECTED   Staphylococcus species NOT DETECTED NOT DETECTED   Staphylococcus aureus (BCID) NOT DETECTED NOT DETECTED   Streptococcus species NOT DETECTED NOT DETECTED   Streptococcus agalactiae NOT DETECTED NOT DETECTED   Streptococcus pneumoniae NOT DETECTED NOT DETECTED   Streptococcus pyogenes NOT DETECTED NOT DETECTED   Acinetobacter baumannii NOT DETECTED NOT DETECTED   Enterobacteriaceae species DETECTED (A) NOT DETECTED   Enterobacter cloacae complex NOT DETECTED NOT DETECTED   Escherichia coli NOT DETECTED NOT DETECTED   Klebsiella oxytoca NOT DETECTED NOT DETECTED   Klebsiella pneumoniae DETECTED (A) NOT DETECTED   Proteus species NOT DETECTED NOT DETECTED   Serratia marcescens NOT DETECTED NOT DETECTED   Carbapenem resistance NOT DETECTED NOT DETECTED   Haemophilus influenzae NOT DETECTED NOT DETECTED   Neisseria meningitidis NOT DETECTED NOT DETECTED   Pseudomonas aeruginosa NOT DETECTED NOT DETECTED   Candida albicans NOT DETECTED NOT DETECTED   Candida glabrata NOT DETECTED NOT DETECTED   Candida krusei NOT DETECTED NOT DETECTED   Candida parapsilosis NOT DETECTED NOT DETECTED   Candida tropicalis  NOT DETECTED NOT DETECTED    Bethany Joseph D 08/27/2019  2:16 PM

## 2019-08-27 NOTE — Evaluation (Signed)
Occupational Therapy Evaluation Patient Details Name: Bethany Joseph MRN: [id number] DOB: 1945/09/08 Today's Date: 08/27/2019    History of Present Illness Bethany Joseph is a 74 year old female who presented to ED on 7/11 with altered mental status, nausea and vomiting and abdominal pain.  Pt admitted for sepsis secondary to cholangitis and started on broad-spectrum antibiotics.  Pt is s/p ERCP on 7/14.  Pt's PMH includes diabetes, Afib, HTN, and COPD.  Patient had a cholecystectomy roughly 10 years ago.   Clinical Impression   [name] seen for OT evaluation this date.  Pt was very irritable throughout today's evaluation, focused on discharge and unwilling to participate in full occupational profile or ADL assessment.  Prior to admission, pt reports being mod I in basic ADLs using RW.  She lives with her husband and has 24/7 assistance at home from her husband and son.  Currently, pt requires grossly setup assist for seated ADLs and min guard assist for ADLs involving functional mobility including toileting and lower body dressing and bathing.  OTR provided min guard assist for pt to complete sit to stand transfer with RW, pt denied further mobility or ADL assessment.  Pt is mod I in bed mobility and is able to reach B feet in seated position to don/doff lower body clothing.  Pt reports her home is set up with all equipment she may need as she has participated in therapy in the past.  Do not suspect any additional OT follow up is needed as pt appears generally close to her baseline level of functioning, as well as pt's disinterest in participating in skilled OT services.  Will discharge pt from skilled OT services in acute setting, please re-consult should pt have change in functional status or additional OT-related needs arise.    Follow Up Recommendations  No OT follow up;Supervision - Intermittent    Equipment Recommendations  None recommended by OT    Recommendations for Other  Services       Precautions / Restrictions Precautions Precautions: Fall Restrictions Weight Bearing Restrictions: No      Mobility Bed Mobility Overal bed mobility: Modified Independent             General bed mobility comments: extra time and effort, HOB elevated  Transfers Overall transfer level: Needs assistance Equipment used: Rolling walker (2 wheeled) Transfers: Sit to/from Stand Sit to Stand: Min guard         General transfer comment: Pt with extra effort/use of momentum & heavy use of BUE support but able to complete sit to stand with min guard assist from OTR    Balance Overall balance assessment: Needs assistance Sitting-balance support: Feet supported;No upper extremity supported Sitting balance-Leahy Scale: Good     Standing balance support: Bilateral upper extremity supported Standing balance-Leahy Scale: Fair Standing balance comment: Pt able to complete marches in place with BUE support from RW, no LOB observed.  Pt refused additional balance or mobility testing.                           ADL either performed or assessed with clinical judgement   ADL Overall ADL's : Needs assistance/impaired                                       General ADL Comments: Pt generally requires setup assist for seated ADLs including feeding,  grooming, upper body dressing and bathing.  Pt grossly requires min guard assist in ADLs involving functional mobility with RW including toileting and lower body bathing/dressing.  Pt able to reach B feet to don/doff lower body clothing.     Vision Patient Visual Report: No change from baseline       Perception     Praxis      Pertinent Vitals/Pain Pain Assessment: No/denies pain     Hand Dominance Right   Extremity/Trunk Assessment Upper Extremity Assessment Upper Extremity Assessment: Overall WFL for tasks assessed   Lower Extremity Assessment Lower Extremity Assessment: Overall WFL for  tasks assessed   Cervical / Trunk Assessment Cervical / Trunk Assessment: Normal   Communication Communication Communication: No difficulties   Cognition Arousal/Alertness: Awake/alert Behavior During Therapy: Agitated;WFL for tasks assessed/performed Overall Cognitive Status: Within Functional Limits for tasks assessed                                 General Comments: Pt very irritable but appears grossly oriented   General Comments  RN removing telemetry and IV during session    Exercises Other Exercises Other Exercises: provided education re: OT role and plan of care, fall and safety precautions, functional transfer practice with RW   Shoulder Instructions      Home Living Family/patient expects to be discharged to:: Private residence Living Arrangements: Spouse/significant other Available Help at Discharge: Family;Available 24 hours/day (pt reports her husband and son will be available 24/7) Type of Home: House Home Access: Stairs to enter [employer] of Steps: 2 Entrance Stairs-Rails: Right;Left Home Layout: Two level;Able to live on main level with bedroom/bathroom (pt reports never going up to 2nd floor)     Bathroom Shower/Tub: Arts development officer Toilet: Handicapped height     Home Equipment: Environmental consultant - 2 wheels;Shower seat - built in;Grab bars - tub/shower   Additional Comments: Home setup and equipment difficult to assess 2/2 pt refusal to answer      Prior Functioning/Environment Level of Independence: Independent with assistive device(s)        Comments: Pt reports using walker at baseline and is independent in basic ADLs.  OTR was unable to gather additional information about PLOF and home setup.        OT Problem List: Decreased activity tolerance;Impaired balance (sitting and/or standing)      OT Treatment/Interventions:      OT Goals(Current goals can be found in the care plan section) Acute Rehab OT  Goals Patient Stated Goal: to go home OT Goal Formulation: With patient  OT Frequency:     Barriers to D/C:            Co-evaluation              AM-PAC OT "6 Clicks" Daily Activity     Outcome Measure Help from another person eating meals?: None Help from another person taking care of personal grooming?: None Help from another person toileting, which includes using toliet, bedpan, or urinal?: A Little Help from another person bathing (including washing, rinsing, drying)?: A Little Help from another person to put on and taking off regular upper body clothing?: None Help from another person to put on and taking off regular lower body clothing?: A Little 6 Click Score: 21   End of Session Equipment Utilized During Treatment: Gait belt;Rolling walker  Activity Tolerance: Treatment limited secondary to agitation Patient left: in  bed;with call bell/phone within reach;with nursing/sitter in room  OT Visit Diagnosis: Other abnormalities of gait and mobility (R26.89)                Time: 5573-2202 OT Time Calculation (min): 14 min Charges:  OT General Charges $OT Visit: 1 Visit OT Evaluation $OT Eval Low Complexity: 1 Low  Abbye Lao Wells Angelina Venard, OTR/L 08/27/19, 10:20 AM

## 2019-08-27 NOTE — Progress Notes (Signed)
Patient is stable and ready for discharge. Patient's IV removed. Patient's belongings packed by husband and taken to his car. Writer went over discharge paperwork with patient and spouse and both verbalized understanding and had no further questions. Patient transported via [facility] by NT to her private car with husband.

## 2019-08-28 LAB — CULTURE, BLOOD (ROUTINE X 2)
Culture: NO GROWTH
Special Requests: ADEQUATE

## 2019-08-29 LAB — CULTURE, BLOOD (ROUTINE X 2): Special Requests: ADEQUATE

## 2019-09-16 ENCOUNTER — Telehealth: Payer: Self-pay

## 2019-09-16 NOTE — Telephone Encounter (Signed)
Called pt to inform her for her new patient appointment that we do not prescribe any pain medications or controlled substances in our clinic. Wanted to be sure she was aware of this before her appt since Vicodin is listed as a current medication at this time.   Told her call back with any questions or concerns.  CM

## 2019-10-07 ENCOUNTER — Ambulatory Visit: Payer: Self-pay | Admitting: Internal Medicine

## 2019-10-20 ENCOUNTER — Telehealth: Payer: Self-pay | Admitting: Gastroenterology

## 2019-10-20 NOTE — Telephone Encounter (Signed)
Hi, Yes, another ERCP is needed to remove that stone before it causes any problems.

## 2019-10-20 NOTE — Telephone Encounter (Signed)
Patient husband calling to state Dr. Servando Snare removed bile duct stones from the patient back on 7.13.21. Pt just recently had an [place] where they think they saw another stone. Pt is wanting to know if she needs another ERCP or other procedure to remove stone again. Please advise and call pt back.

## 2019-10-21 ENCOUNTER — Ambulatory Visit: Payer: Self-pay | Admitting: Internal Medicine

## 2019-10-22 ENCOUNTER — Other Ambulatory Visit: Payer: Self-pay

## 2019-10-22 DIAGNOSIS — I479 Paroxysmal tachycardia, unspecified: Secondary | ICD-10-CM | POA: Insufficient documentation

## 2019-10-22 DIAGNOSIS — E039 Hypothyroidism, unspecified: Secondary | ICD-10-CM | POA: Insufficient documentation

## 2019-10-22 DIAGNOSIS — M109 Gout, unspecified: Secondary | ICD-10-CM | POA: Insufficient documentation

## 2019-10-22 DIAGNOSIS — K8036 Calculus of bile duct with acute and chronic cholangitis without obstruction: Secondary | ICD-10-CM

## 2019-10-22 NOTE — Telephone Encounter (Signed)
Pt has been scheduled for an ERCP on 11/03/19 with Dr. Servando Snare.

## 2019-10-27 ENCOUNTER — Telehealth: Payer: Self-pay

## 2019-10-27 NOTE — Telephone Encounter (Signed)
Pt notified per Dr. Juliann Pares to stop Eliquis 5mg  7 days prior to procedure and restart 2 days after. See clearance in Media.

## 2019-10-30 ENCOUNTER — Other Ambulatory Visit: Payer: Self-pay

## 2019-10-30 ENCOUNTER — Other Ambulatory Visit
Admission: RE | Admit: 2019-10-30 | Discharge: 2019-10-30 | Disposition: A | Discharge status: Home / Self Care | Payer: Medicare Other | Source: Ambulatory Visit | Attending: Gastroenterology | Admitting: Gastroenterology

## 2019-10-30 DIAGNOSIS — Z20822 Contact with and (suspected) exposure to covid-19: Secondary | ICD-10-CM | POA: Insufficient documentation

## 2019-10-30 DIAGNOSIS — Z01812 Encounter for preprocedural laboratory examination: Secondary | ICD-10-CM | POA: Insufficient documentation

## 2019-10-31 LAB — SARS CORONAVIRUS 2 (TAT 6-24 HRS): SARS Coronavirus 2: NEGATIVE

## 2019-11-03 ENCOUNTER — Ambulatory Visit: Payer: Medicare Other | Admitting: Anesthesiology

## 2019-11-03 ENCOUNTER — Encounter
Admission: RE | Disposition: A | Discharge status: Home / Self Care | Payer: Self-pay | Source: Home / Self Care | Attending: Gastroenterology

## 2019-11-03 ENCOUNTER — Ambulatory Visit
Admission: RE | Admit: 2019-11-03 | Discharge: 2019-11-03 | Disposition: A | Discharge status: Home / Self Care | Payer: Medicare Other | Attending: Gastroenterology | Admitting: Gastroenterology

## 2019-11-03 ENCOUNTER — Encounter: Payer: Self-pay | Admitting: Gastroenterology

## 2019-11-03 ENCOUNTER — Ambulatory Visit: Payer: Medicare Other

## 2019-11-03 DIAGNOSIS — E1151 Type 2 diabetes mellitus with diabetic peripheral angiopathy without gangrene: Secondary | ICD-10-CM | POA: Insufficient documentation

## 2019-11-03 DIAGNOSIS — Z96653 Presence of artificial knee joint, bilateral: Secondary | ICD-10-CM | POA: Diagnosis not present

## 2019-11-03 DIAGNOSIS — I4821 Permanent atrial fibrillation: Secondary | ICD-10-CM | POA: Diagnosis not present

## 2019-11-03 DIAGNOSIS — E1140 Type 2 diabetes mellitus with diabetic neuropathy, unspecified: Secondary | ICD-10-CM | POA: Diagnosis not present

## 2019-11-03 DIAGNOSIS — K8050 Calculus of bile duct without cholangitis or cholecystitis without obstruction: Secondary | ICD-10-CM | POA: Insufficient documentation

## 2019-11-03 DIAGNOSIS — K219 Gastro-esophageal reflux disease without esophagitis: Secondary | ICD-10-CM | POA: Diagnosis not present

## 2019-11-03 DIAGNOSIS — Z9049 Acquired absence of other specified parts of digestive tract: Secondary | ICD-10-CM | POA: Diagnosis not present

## 2019-11-03 DIAGNOSIS — E785 Hyperlipidemia, unspecified: Secondary | ICD-10-CM | POA: Insufficient documentation

## 2019-11-03 DIAGNOSIS — Z87891 Personal history of nicotine dependence: Secondary | ICD-10-CM | POA: Diagnosis not present

## 2019-11-03 DIAGNOSIS — I10 Essential (primary) hypertension: Secondary | ICD-10-CM | POA: Insufficient documentation

## 2019-11-03 DIAGNOSIS — Z7901 Long term (current) use of anticoagulants: Secondary | ICD-10-CM | POA: Diagnosis not present

## 2019-11-03 DIAGNOSIS — Z79890 Hormone replacement therapy (postmenopausal): Secondary | ICD-10-CM | POA: Insufficient documentation

## 2019-11-03 DIAGNOSIS — E890 Postprocedural hypothyroidism: Secondary | ICD-10-CM | POA: Insufficient documentation

## 2019-11-03 DIAGNOSIS — J449 Chronic obstructive pulmonary disease, unspecified: Secondary | ICD-10-CM | POA: Diagnosis not present

## 2019-11-03 DIAGNOSIS — M1990 Unspecified osteoarthritis, unspecified site: Secondary | ICD-10-CM | POA: Insufficient documentation

## 2019-11-03 DIAGNOSIS — Z7984 Long term (current) use of oral hypoglycemic drugs: Secondary | ICD-10-CM | POA: Insufficient documentation

## 2019-11-03 DIAGNOSIS — K8036 Calculus of bile duct with acute and chronic cholangitis without obstruction: Secondary | ICD-10-CM

## 2019-11-03 DIAGNOSIS — Z79899 Other long term (current) drug therapy: Secondary | ICD-10-CM | POA: Insufficient documentation

## 2019-11-03 HISTORY — PX: ERCP: SHX5425

## 2019-11-03 SURGERY — ERCP, WITH INTERVENTION IF INDICATED
Anesthesia: General

## 2019-11-03 MED ORDER — PROPOFOL 500 MG/50ML IV EMUL
INTRAVENOUS | Status: AC
  Filled 2019-11-03: qty 50

## 2019-11-03 MED ORDER — PROPOFOL 500 MG/50ML IV EMUL
INTRAVENOUS | Status: DC | PRN
  Administered 2019-11-03: 150 ug/kg/min via INTRAVENOUS

## 2019-11-03 MED ORDER — GLYCOPYRROLATE 0.2 MG/ML IJ SOLN
INTRAMUSCULAR | Status: AC
  Filled 2019-11-03: qty 1

## 2019-11-03 MED ORDER — FENTANYL CITRATE (PF) 100 MCG/2ML IJ SOLN
INTRAMUSCULAR | Status: DC | PRN
Start: 2019-11-03 — End: 2019-11-03
  Administered 2019-11-03: 25 ug via INTRAVENOUS
  Administered 2019-11-03: 50 ug via INTRAVENOUS

## 2019-11-03 MED ORDER — LACTATED RINGERS IV SOLN: INTRAVENOUS | Status: DC

## 2019-11-03 MED ORDER — PROPOFOL 10 MG/ML IV BOLUS
INTRAVENOUS | Status: DC | PRN
  Administered 2019-11-03: 20 mg via INTRAVENOUS
  Administered 2019-11-03: 70 mg via INTRAVENOUS

## 2019-11-03 MED ORDER — LIDOCAINE HCL (PF) 2 % IJ SOLN
INTRAMUSCULAR | Status: AC
  Filled 2019-11-03: qty 5

## 2019-11-03 MED ORDER — LIDOCAINE HCL (CARDIAC) PF 100 MG/5ML IV SOSY
PREFILLED_SYRINGE | INTRAVENOUS | Status: DC | PRN
  Administered 2019-11-03: 50 mg via INTRAVENOUS

## 2019-11-03 MED ORDER — SODIUM CHLORIDE 0.9 % IV SOLN: INTRAVENOUS | Status: DC

## 2019-11-03 MED ORDER — FENTANYL CITRATE (PF) 100 MCG/2ML IJ SOLN
INTRAMUSCULAR | Status: AC
  Filled 2019-11-03: qty 2

## 2019-11-03 NOTE — Op Note (Signed)
[facility] Gastroenterology Patient Name: Bethany Joseph Procedure Date: 11/03/2019 12:05 PM MRN: [id number] Account #: [id number] Date of Birth: 11/11/1945 Admit Type: Outpatient Age: 74 Room: [facility] ENDO ROOM 4 Gender: Female Note Status: Finalized Procedure:             ERCP Indications:           Bile duct stone(s) Providers:             Midge Minium MD, MD Referring MD:          Doyce Para. Lurlean Leyden, MD (Referring MD) Medicines:             Propofol per Anesthesia Complications:         No immediate complications. Procedure:             Pre-Anesthesia Assessment:                        - Prior to the procedure, a History and Physical was                         performed, and patient medications and allergies were                         reviewed. The patient's tolerance of previous                         anesthesia was also reviewed. The risks and benefits                         of the procedure and the sedation options and risks                         were discussed with the patient. All questions were                         answered, and informed consent was obtained. Prior                         Anticoagulants: The patient has taken no previous                         anticoagulant or antiplatelet agents. ASA Grade                         Assessment: II - A patient with mild systemic disease.                         After reviewing the risks and benefits, the patient                         was deemed in satisfactory condition to undergo the                         procedure.                        After obtaining informed consent, the scope was passed  under direct vision. Throughout the procedure, the                         patient's blood pressure, pulse, and oxygen                         saturations were monitored continuously. The was                         introduced through the mouth, and used to inject                          contrast into and used to inject contrast into the                         bile duct. The ERCP was accomplished without                         difficulty. The patient tolerated the procedure well. Findings:      , A biliary sphincterotomy had been performed. The sphincterotomy       appeared open. A scout film of the abdomen was obtained. Surgical clips,       consistent with a previous cholecystectomy, were seen in the area of the       right upper quadrant of the abdomen. The bile duct was deeply cannulated       with the short-nosed traction sphincterotome. Contrast was injected. I       personally interpreted the bile duct images. There was brisk flow of       contrast through the ducts. Image quality was excellent. Contrast       extended to the entire biliary tree. A wire was passed into the biliary       tree. The biliary tree was swept with a 15 mm balloon starting at the       bifurcation. Sludge was swept from the duct. All stones were removed. Impression:            - Prior biliary sphincterotomy appeared open.                        - Choledocholithiasis was found. Complete removal was                         accomplished by balloon extraction.                        - The biliary tree was swept. Recommendation:        - Resume previous diet.                        - Discharge patient to home.                        - Continue present medications. Procedure Code(s):     --- Professional ---                        [phone], Endoscopic retrograde cholangiopancreatography                         (ERCP);  with removal of calculi/debris from                         biliary/pancreatic duct(s)                        [phone], Endoscopic catheterization of the biliary                         ductal system, radiological supervision and                         interpretation Diagnosis Code(s):     --- Professional ---                        K80.50, Calculus of bile duct without  cholangitis or                         cholecystitis without obstruction CPT copyright 2019 American [employer] Association. All rights reserved. The codes documented in this report are preliminary and upon coder review may  be revised to meet current compliance requirements. Midge Minium MD, MD 11/03/2019 12:47:10 PM This report has been signed electronically. Number of Addenda: 0 Note Initiated On: 11/03/2019 12:05 PM Estimated Blood Loss:  Estimated blood loss: none.      [facility]

## 2019-11-03 NOTE — H&P (Signed)
Midge Minium, MD [facility] [address], [address] [place], [place] [address] Phone:[phone] Fax : [phone]  Primary Care Physician:  Yisroel Ramming, MD Primary Gastroenterologist:  Dr. Servando Snare  Pre-Procedure History & Physical: HPI:  Bethany Joseph is a 74 y.o. female is here for an ERCP.   Past Medical History:  Diagnosis Date   Acquired hypothyroidism    Diagnosed with peripheral resistance   Allergic rhinitis    Aortic valve disorder    Arrhythmia    Arthritis    Asthma    Asthma without status asthmaticus    unspecified   At risk for falls    uses cane, arthritis   Atrial fibrillation (HCC)    Permanent at this time CHADSVASc=3 (age 40, DM, female) 05/30/09 - Afib at 107 4/25 A fib 88 with better rate control on Multaq, but dc Multaq since in permanent A Fib, refuses Coumadin therapy    Avascular necrosis (HCC)    left foot   Diabetes (HCC)    Diabetic neuropathy (HCC)    unspecified   Difficult intubation    has a difficult intubation card   Fracture of distal femur (HCC)    left periprosthetic, status post ORIF   GERD (gastroesophageal reflux disease)    Gout    Heart murmur    History of atrial fibrillation    History of chicken pox    Humerus distal fracture    left - inury on 05/21/12   Hyperkeratosis    Pre-ulcerative hyperkeratotic lesions first MTPJs   Hyperlipidemia    Hypertension    Hyperthyroidism    Mycotic toenails    Osteoarthritis    Osteoporosis, post-menopausal    Status post bilateral knee replacement. DEXA 09/2009: t=-1.6 spine, t=-3.1 right fem neck   Paroxysmal tachycardia (HCC)    now in chronic A fib but rate is well controlled   Type II diabetes mellitus (HCC)     Past Surgical History:  Procedure Laterality Date   CHOLECYSTECTOMY     ENDOSCOPIC RETROGRADE CHOLANGIOPANCREATOGRAPHY (ERCP) WITH PROPOFOL N/A 08/25/2019   Procedure: ENDOSCOPIC RETROGRADE CHOLANGIOPANCREATOGRAPHY (ERCP) WITH  PROPOFOL;  Surgeon: Midge Minium, MD;  Location: [facility] ENDOSCOPY;  Service: Endoscopy;  Laterality: N/A;   FEMUR FRACTURE SURGERY Left 2010   INTRAMEDULLARY (IM) NAIL INTERTROCHANTERIC Right 07/15/2017   Procedure: INTRAMEDULLARY (IM) NAIL INTERTROCHANTRIC;  Surgeon: Kennedy Bucker, MD;  Location: [facility] ORS;  Service: Orthopedics;  Laterality: Right;   JOINT REPLACEMENT  2007   knee replacement surgeries   ORIF FIBULA FRACTURE     ORIF HUMERUS FRACTURE Left 05/28/2012   supracondylar distal   REPLACEMENT TOTAL KNEE BILATERAL     THYROIDECTOMY  1975   [place]   TOTAL ABDOMINAL HYSTERECTOMY W/ BILATERAL SALPINGOOPHORECTOMY  1983   [place]   TOTAL KNEE ARTHROPLASTY Bilateral     Prior to Admission medications   Medication Sig Start Date End Date Taking? Authorizing Provider  albuterol (PROAIR HFA) 108 (90 Base) MCG/ACT inhaler USE 2 PUFFS TWICE DAILY AS NEEDED shortness of breath 04/02/14  Yes [provider]  gabapentin (NEURONTIN) 100 MG capsule Take 100 mg by mouth daily. 08/12/19  Yes [provider]  levothyroxine (SYNTHROID, LEVOTHROID) 25 MCG tablet Take 25 mcg by mouth daily before breakfast.   Yes [provider]  losartan (COZAAR) 50 MG tablet Take 50 mg by mouth daily. 10/12/19  Yes [provider]  metFORMIN (GLUCOPHAGE-XR) 500 MG 24 hr tablet Take 1,000 mg by mouth every evening.   Yes  [provider]  omeprazole (PRILOSEC) 40 MG capsule TAKE ONE CAPSULE BY MOUTH DAILY 06/13/15  Yes [provider]  vitamin E 400 UNIT capsule Take 400 Units by mouth 3 (three) times daily.    Yes [provider]  zolpidem (AMBIEN) 5 MG tablet Take 5 mg by mouth at bedtime as needed for sleep.   Yes [provider]  acetaminophen (TYLENOL) 325 MG tablet Take 650 mg by mouth every 4 (four) hours as needed.    [provider]  Bacillus Coagulans-Inulin (ALIGN PREBIOTIC-PROBIOTIC PO) Take 1 capsule by mouth daily.    [provider]  bisacodyl (DULCOLAX) 10 MG suppository Place 10 mg rectally as needed for moderate constipation.    [provider]  Cholecalciferol 4000 units CAPS Take 1 capsule by mouth daily.    [provider]  doxepin (SINEQUAN) 10 MG capsule Take 10 mg by mouth at bedtime. 08/03/19   [provider]  ELIQUIS 5 MG TABS tablet Take 5 mg by mouth every 12 (twelve) hours. 06/24/17   [provider]  gabapentin (NEURONTIN) 400 MG capsule Take 400 mg by mouth at bedtime.    [provider]  hydrochlorothiazide (HYDRODIURIL) 12.5 MG tablet Take 12.5 mg by mouth daily. 10/20/19   [provider]  HYDROcodone-acetaminophen (NORCO/VICODIN) 5-325 MG tablet Take 1 tablet by mouth every 4 (four) hours as needed. 08/03/19   [provider]  liothyronine (CYTOMEL) 25 MCG tablet Take 100 mcg by mouth daily. 4 tabs    [provider]  loratadine (CLARITIN) 10 MG tablet Take 10 mg by mouth daily.    [provider]  nystatin (MYCOSTATIN) 100000 UNIT/ML suspension  09/03/19   [provider]  Omega 3 1200 MG CAPS Take 1,200 mg by mouth daily.    [provider]  potassium chloride SA (KLOR-CON) 20 MEQ tablet Take 20 mEq by mouth daily. 08/18/19   [provider]  pravastatin (PRAVACHOL) 20 MG tablet Take 20 mg by mouth. 10/24/15   [provider]  raloxifene (EVISTA) 60 MG tablet TAKE ONE (1) TABLET BY MOUTH EVERY DAY Patient not taking: Reported on 08/23/2019 02/22/16   [provider]  sennosides-docusate sodium (SENOKOT-S) 8.6-50 MG tablet Take 2 tablets by mouth 2 (two) times daily. Patient not taking: Reported on 08/23/2019    [provider]  simethicone (MYLICON) 80 MG chewable tablet Chew 1 tablet (80 mg total) by mouth every 6 (six) hours as needed for flatulence. 08/27/19   Tresa Moore, MD  torsemide (DEMADEX) 20 MG tablet Take 20 mg by mouth 2 (two) times daily. 07/22/19    [provider]    Allergies as of 10/22/2019 - Review Complete 08/25/2019  Allergen Reaction Noted   Neosporin [neomycin-polymyxin-gramicidin] Hives 01/25/2016   Tape  12/18/2013   Codeine Rash 01/25/2016   Latex Rash 01/25/2016   Neomycin-bacitracin zn-polymyx Rash 08/21/2012   Penicillins Rash and Other (See Comments) 01/25/2016    Family History  Problem Relation Age of Onset   Colon cancer Mother    Osteoporosis Mother    Arthritis Mother    Early death Mother    Diabetes Father    Hypertension Father    Heart attack Father    Coronary artery disease Father    Early death Father    Heart disease Father    Hyperlipidemia Father    Depression Brother    Diabetes Brother    Mental illness Brother  Asthma Maternal Grandfather    COPD Maternal Grandfather    Asthma Maternal Aunt    COPD Maternal Aunt    Cancer Maternal Aunt     Social History   Socioeconomic History   Marital status: Married    Spouse name: Molly Maduro   Number of children: 2   Years of education: 12   Highest education level: High school graduate  Occupational History   Not on file  Tobacco Use   Smoking status: Former Smoker    Types: Cigarettes    Quit date: 02/13/1979    Years since quitting: 40.7   Smokeless tobacco: Never Used  Vaping Use   Vaping Use: Never used  Substance and Sexual Activity   Alcohol use: Yes    Alcohol/week: 14.0 standard drinks    Types: 14 Glasses of wine per week   Drug use: No   Sexual activity: Not Currently  Other Topics Concern   Not on file  Social History Narrative   Not on file   Social Determinants of Health   Financial Resource Strain:    Difficulty of Paying Living Expenses: Not on file  Food Insecurity:    Worried About Running Out of Food in the Last Year: Not on file   Ran Out of Food in the Last Year: Not on file  Transportation Needs:    Lack of Transportation (Medical): Not on file    Lack of Transportation (Non-Medical): Not on file  Physical Activity:    Days of Exercise per Week: Not on file   Minutes of Exercise per Session: Not on file  Stress:    Feeling of Stress : Not on file  Social Connections:    Frequency of Communication with Friends and Family: Not on file   Frequency of Social Gatherings with Friends and Family: Not on file   Attends Religious Services: Not on file   Active Member of Clubs or Organizations: Not on file   Attends Banker Meetings: Not on file   Marital Status: Not on file  Intimate Partner Violence:    Fear of Current or Ex-Partner: Not on file   Emotionally Abused: Not on file   Physically Abused: Not on file   Sexually Abused: Not on file    Review of Systems: See HPI, otherwise negative ROS  Physical Exam: BP (!) 128/92    Pulse 70    Temp (!) 97 F (36.1 C) (Temporal)    Resp 16    Ht 5\' 7"  (1.702 m)    Wt 79.4 kg    SpO2 100%    BMI 27.41 kg/m  General:   Alert,  pleasant and cooperative in NAD Head:  Normocephalic and atraumatic. Neck:  Supple; no masses or thyromegaly. Lungs:  Clear throughout to auscultation.    Heart:  Regular rate and rhythm. Abdomen:  Soft, nontender and nondistended. Normal bowel sounds, without guarding, and without rebound.   Neurologic:  Alert and  oriented x4;  grossly normal neurologically.  Impression/Plan: Bethany Joseph is here for an ERCP to be performed for CBD stone  Risks, benefits, limitations, and alternatives regarding  ERCP have been reviewed with the patient.  Questions have been answered.  All parties agreeable.   [name], MD  11/03/2019, 11:57 AM

## 2019-11-03 NOTE — Anesthesia Preprocedure Evaluation (Signed)
Anesthesia Evaluation  Patient identified by MRN, date of birth, ID band Patient awake  General Assessment Comment:Surgery before with 3 attempts at intubation, using Mac 3 with grade 3 view, Mcgrath 3 with grade 2 view, and success with Glidescope However most recent ERCP under GETA was performed with straightforward uncomplicated intubation  Reviewed: Allergy & Precautions, H&P , NPO status , Patient's Chart, lab work & pertinent test results, reviewed documented beta blocker date and time   History of Anesthesia Complications (+) DIFFICULT AIRWAY and history of anesthetic complications  Airway Mallampati: III  TM Distance: <3 FB Neck ROM: full  Mouth opening: Limited Mouth Opening Comment: Small chin Dental  (+) Missing, Dental Advidsory Given, Poor Dentition   Pulmonary asthma , neg sleep apnea, COPD, Patient abstained from smoking.Not current smoker, former smoker,    Pulmonary exam normal breath sounds clear to auscultation       Cardiovascular Exercise Tolerance: Good METShypertension, + Peripheral Vascular Disease  (-) CAD and (-) Past MI + dysrhythmias Atrial Fibrillation  Rhythm:irregular Rate:Normal  TTE 2019: INTERPRETATION  NORMAL LEFT VENTRICULAR SYSTOLIC FUNCTION WITH AN ESTIMATED EF = 55 %  NORMAL RIGHT VENTRICULAR SYSTOLIC FUNCTION  MODERATE TRICUSPID AND MITRAL VALVE INSUFFICIENCY  TRACE AORTIC VALVE INSUFFICIENCY  MILD RV ENLARGEMENT  MILD BIATRIAL ENLARGEMENT  SCLEROTIC AORTIC VALVE WITHOUT STENOSIS    Patient with bradycardia in 20s-40s rate, asymptomatic. Per cardiology, no indication for pacemaker currently.   Neuro/Psych negative neurological ROS  negative psych ROS   GI/Hepatic GERD  Medicated and Controlled,(+)     (-) substance abuse  ,   Endo/Other  diabetes, Type 2Hyperthyroidism   Renal/GU negative Renal ROS     Musculoskeletal  (+) Arthritis ,   Abdominal   Peds  Hematology    Anesthesia Other Findings Past Medical History: No date: Allergic rhinitis No date: Aortic valve disorder No date: Asthma No date: Atrial fibrillation (HCC) No date: Avascular necrosis (HCC) No date: Diabetes (HCC) No date: Hyperlipidemia No date: Hyperthyroidism No date: Osteoarthritis No date: Osteoporosis  Past Surgical History: No date: ABDOMINAL HYSTERECTOMY No date: CHOLECYSTECTOMY No date: ORIF FIBULA FRACTURE No date: ORIF HUMERUS FRACTURE No date: REPLACEMENT TOTAL KNEE BILATERAL  BMI    Body Mass Index:  27.29 kg/m      Reproductive/Obstetrics                             Anesthesia Physical  Anesthesia Plan  ASA: III  Anesthesia Plan: General   Post-op Pain Management:    Induction: Intravenous  PONV Risk Score and Plan: 3 and Treatment may vary due to age or medical condition and Ondansetron  Airway Management Planned: Natural Airway  Additional Equipment: None  Intra-op Plan:   Post-operative Plan:   Informed Consent: I have reviewed the patients History and Physical, chart, labs and discussed the procedure including the risks, benefits and alternatives for the proposed anesthesia with the patient or authorized representative who has indicated his/her understanding and acceptance.     Dental Advisory Given  Plan Discussed with: Anesthesiologist  Anesthesia Plan Comments: (Discussed risks of anesthesia with patient, including possibility of difficulty with spontaneous ventilation under anesthesia necessitating airway intervention, PONV, and rare risks such as cardiac or respiratory or neurological events. Patient understands.)        Anesthesia Quick Evaluation

## 2019-11-03 NOTE — Anesthesia Postprocedure Evaluation (Signed)
Anesthesia Post Note  Patient: Bethany Joseph  Procedure(s) Performed: ENDOSCOPIC RETROGRADE CHOLANGIOPANCREATOGRAPHY (ERCP) (N/A )  Patient location during evaluation: Endoscopy Anesthesia Type: General Level of consciousness: awake and alert Pain management: pain level controlled Vital Signs Assessment: post-procedure vital signs reviewed and stable Respiratory status: spontaneous breathing, nonlabored ventilation, respiratory function stable and patient connected to nasal cannula oxygen Cardiovascular status: blood pressure returned to baseline and stable Postop Assessment: no apparent nausea or vomiting Anesthetic complications: no   No complications documented.   Last Vitals:  Vitals:   11/03/19 1244 11/03/19 1245  BP: (!) 127/56 (!) 127/56  Pulse:  63  Resp:  18  Temp: (!) 36 C (!) 36 C  SpO2:  100%    Last Pain:  Vitals:   11/03/19 1245  TempSrc: Temporal  PainSc: 0-No pain                 Corinda Gubler

## 2019-11-03 NOTE — Anesthesia Procedure Notes (Signed)
Date/Time: 11/03/2019 12:20 PM Performed by: Ginger Carne, CRNA Pre-anesthesia Checklist: Patient identified, Emergency Drugs available, Suction available, Patient being monitored and Timeout performed Patient Re-evaluated:Patient Re-evaluated prior to induction Oxygen Delivery Method: Nasal cannula Preoxygenation: Pre-oxygenation with 100% oxygen Induction Type: IV induction

## 2019-11-03 NOTE — Transfer of Care (Signed)
Immediate Anesthesia Transfer of Care Note  Patient: Bethany Joseph  Procedure(s) Performed: ENDOSCOPIC RETROGRADE CHOLANGIOPANCREATOGRAPHY (ERCP) (N/A )  Patient Location: PACU  Anesthesia Type:General  Level of Consciousness: awake, alert  and oriented  Airway & Oxygen Therapy: Patient Spontanous Breathing  Post-op Assessment: Report given to RN and Post -op Vital signs reviewed and stable  Post vital signs: Reviewed and stable  Last Vitals:  Vitals Value Taken Time  BP 127/56 11/03/19 1245  Temp    Pulse 63 11/03/19 1245  Resp 18 11/03/19 1245  SpO2 100 % 11/03/19 1245    Last Pain:  Vitals:   11/03/19 1059  TempSrc: Temporal         Complications: No complications documented.

## 2019-11-04 ENCOUNTER — Encounter: Payer: Self-pay | Admitting: Gastroenterology

## 2019-11-24 ENCOUNTER — Ambulatory Visit: Payer: Self-pay | Admitting: Internal Medicine

## 2020-02-02 ENCOUNTER — Emergency Department: Payer: Medicare Other

## 2020-02-02 ENCOUNTER — Inpatient Hospital Stay
Admission: EM | Admit: 2020-02-02 | Discharge: 2020-02-07 | DRG: 871 | Disposition: A | Discharge status: Home / Self Care | Payer: Medicare Other | Attending: Internal Medicine | Admitting: Internal Medicine

## 2020-02-02 ENCOUNTER — Encounter: Payer: Self-pay | Admitting: *Deleted

## 2020-02-02 ENCOUNTER — Other Ambulatory Visit: Payer: Self-pay

## 2020-02-02 DIAGNOSIS — R6520 Severe sepsis without septic shock: Secondary | ICD-10-CM

## 2020-02-02 DIAGNOSIS — Z8261 Family history of arthritis: Secondary | ICD-10-CM

## 2020-02-02 DIAGNOSIS — E890 Postprocedural hypothyroidism: Secondary | ICD-10-CM | POA: Diagnosis present

## 2020-02-02 DIAGNOSIS — I4819 Other persistent atrial fibrillation: Secondary | ICD-10-CM | POA: Diagnosis present

## 2020-02-02 DIAGNOSIS — R21 Rash and other nonspecific skin eruption: Secondary | ICD-10-CM | POA: Diagnosis not present

## 2020-02-02 DIAGNOSIS — I110 Hypertensive heart disease with heart failure: Secondary | ICD-10-CM | POA: Diagnosis present

## 2020-02-02 DIAGNOSIS — M109 Gout, unspecified: Secondary | ICD-10-CM | POA: Diagnosis present

## 2020-02-02 DIAGNOSIS — E876 Hypokalemia: Secondary | ICD-10-CM | POA: Diagnosis not present

## 2020-02-02 DIAGNOSIS — Z880 Allergy status to penicillin: Secondary | ICD-10-CM

## 2020-02-02 DIAGNOSIS — E119 Type 2 diabetes mellitus without complications: Secondary | ICD-10-CM

## 2020-02-02 DIAGNOSIS — Z9049 Acquired absence of other specified parts of digestive tract: Secondary | ICD-10-CM

## 2020-02-02 DIAGNOSIS — I5042 Chronic combined systolic (congestive) and diastolic (congestive) heart failure: Secondary | ICD-10-CM | POA: Diagnosis present

## 2020-02-02 DIAGNOSIS — I5032 Chronic diastolic (congestive) heart failure: Secondary | ICD-10-CM | POA: Diagnosis present

## 2020-02-02 DIAGNOSIS — R4182 Altered mental status, unspecified: Secondary | ICD-10-CM

## 2020-02-02 DIAGNOSIS — Z91048 Other nonmedicinal substance allergy status: Secondary | ICD-10-CM

## 2020-02-02 DIAGNOSIS — I214 Non-ST elevation (NSTEMI) myocardial infarction: Secondary | ICD-10-CM | POA: Diagnosis present

## 2020-02-02 DIAGNOSIS — E872 Acidosis, unspecified: Secondary | ICD-10-CM

## 2020-02-02 DIAGNOSIS — I2510 Atherosclerotic heart disease of native coronary artery without angina pectoris: Secondary | ICD-10-CM | POA: Diagnosis present

## 2020-02-02 DIAGNOSIS — M1990 Unspecified osteoarthritis, unspecified site: Secondary | ICD-10-CM | POA: Diagnosis present

## 2020-02-02 DIAGNOSIS — Z885 Allergy status to narcotic agent status: Secondary | ICD-10-CM

## 2020-02-02 DIAGNOSIS — A4189 Other specified sepsis: Secondary | ICD-10-CM | POA: Diagnosis not present

## 2020-02-02 DIAGNOSIS — I10 Essential (primary) hypertension: Secondary | ICD-10-CM | POA: Diagnosis present

## 2020-02-02 DIAGNOSIS — I359 Nonrheumatic aortic valve disorder, unspecified: Secondary | ICD-10-CM | POA: Diagnosis present

## 2020-02-02 DIAGNOSIS — E1140 Type 2 diabetes mellitus with diabetic neuropathy, unspecified: Secondary | ICD-10-CM | POA: Diagnosis present

## 2020-02-02 DIAGNOSIS — Z79899 Other long term (current) drug therapy: Secondary | ICD-10-CM

## 2020-02-02 DIAGNOSIS — E785 Hyperlipidemia, unspecified: Secondary | ICD-10-CM | POA: Diagnosis present

## 2020-02-02 DIAGNOSIS — F32A Depression, unspecified: Secondary | ICD-10-CM | POA: Diagnosis present

## 2020-02-02 DIAGNOSIS — Z83438 Family history of other disorder of lipoprotein metabolism and other lipidemia: Secondary | ICD-10-CM

## 2020-02-02 DIAGNOSIS — L03116 Cellulitis of left lower limb: Secondary | ICD-10-CM | POA: Diagnosis present

## 2020-02-02 DIAGNOSIS — G9340 Encephalopathy, unspecified: Secondary | ICD-10-CM | POA: Diagnosis present

## 2020-02-02 DIAGNOSIS — R778 Other specified abnormalities of plasma proteins: Secondary | ICD-10-CM | POA: Diagnosis present

## 2020-02-02 DIAGNOSIS — Z818 Family history of other mental and behavioral disorders: Secondary | ICD-10-CM

## 2020-02-02 DIAGNOSIS — Z7984 Long term (current) use of oral hypoglycemic drugs: Secondary | ICD-10-CM

## 2020-02-02 DIAGNOSIS — Z833 Family history of diabetes mellitus: Secondary | ICD-10-CM

## 2020-02-02 DIAGNOSIS — J309 Allergic rhinitis, unspecified: Secondary | ICD-10-CM | POA: Diagnosis present

## 2020-02-02 DIAGNOSIS — Z888 Allergy status to other drugs, medicaments and biological substances status: Secondary | ICD-10-CM

## 2020-02-02 DIAGNOSIS — J449 Chronic obstructive pulmonary disease, unspecified: Secondary | ICD-10-CM | POA: Diagnosis present

## 2020-02-02 DIAGNOSIS — Z825 Family history of asthma and other chronic lower respiratory diseases: Secondary | ICD-10-CM

## 2020-02-02 DIAGNOSIS — Z90710 Acquired absence of both cervix and uterus: Secondary | ICD-10-CM

## 2020-02-02 DIAGNOSIS — Z952 Presence of prosthetic heart valve: Secondary | ICD-10-CM

## 2020-02-02 DIAGNOSIS — D509 Iron deficiency anemia, unspecified: Secondary | ICD-10-CM | POA: Diagnosis present

## 2020-02-02 DIAGNOSIS — Z20822 Contact with and (suspected) exposure to covid-19: Secondary | ICD-10-CM | POA: Diagnosis present

## 2020-02-02 DIAGNOSIS — Z7901 Long term (current) use of anticoagulants: Secondary | ICD-10-CM

## 2020-02-02 DIAGNOSIS — Z87891 Personal history of nicotine dependence: Secondary | ICD-10-CM

## 2020-02-02 DIAGNOSIS — K219 Gastro-esophageal reflux disease without esophagitis: Secondary | ICD-10-CM | POA: Diagnosis present

## 2020-02-02 DIAGNOSIS — Z91040 Latex allergy status: Secondary | ICD-10-CM

## 2020-02-02 DIAGNOSIS — Z8262 Family history of osteoporosis: Secondary | ICD-10-CM

## 2020-02-02 DIAGNOSIS — E039 Hypothyroidism, unspecified: Secondary | ICD-10-CM | POA: Diagnosis present

## 2020-02-02 DIAGNOSIS — Z79890 Hormone replacement therapy (postmenopausal): Secondary | ICD-10-CM

## 2020-02-02 DIAGNOSIS — L03115 Cellulitis of right lower limb: Secondary | ICD-10-CM | POA: Diagnosis present

## 2020-02-02 DIAGNOSIS — N289 Disorder of kidney and ureter, unspecified: Secondary | ICD-10-CM | POA: Diagnosis present

## 2020-02-02 DIAGNOSIS — G9341 Metabolic encephalopathy: Secondary | ICD-10-CM | POA: Diagnosis present

## 2020-02-02 DIAGNOSIS — G4700 Insomnia, unspecified: Secondary | ICD-10-CM | POA: Diagnosis present

## 2020-02-02 DIAGNOSIS — Z96653 Presence of artificial knee joint, bilateral: Secondary | ICD-10-CM | POA: Diagnosis present

## 2020-02-02 DIAGNOSIS — G8929 Other chronic pain: Secondary | ICD-10-CM | POA: Diagnosis present

## 2020-02-02 DIAGNOSIS — I4821 Permanent atrial fibrillation: Secondary | ICD-10-CM | POA: Diagnosis present

## 2020-02-02 DIAGNOSIS — M810 Age-related osteoporosis without current pathological fracture: Secondary | ICD-10-CM | POA: Diagnosis present

## 2020-02-02 LAB — CBC
HCT: 34.7 % — ABNORMAL LOW (ref 36.0–46.0)
Hemoglobin: 11.1 g/dL — ABNORMAL LOW (ref 12.0–15.0)
MCH: 27.5 pg (ref 26.0–34.0)
MCHC: 32 g/dL (ref 30.0–36.0)
MCV: 85.9 fL (ref 80.0–100.0)
Platelets: 225 10*3/uL (ref 150–400)
RBC: 4.04 MIL/uL (ref 3.87–5.11)
RDW: 15.8 % — ABNORMAL HIGH (ref 11.5–15.5)
WBC: 11.4 10*3/uL — ABNORMAL HIGH (ref 4.0–10.5)
nRBC: 0 % (ref 0.0–0.2)

## 2020-02-02 LAB — COMPREHENSIVE METABOLIC PANEL
ALT: 18 U/L (ref 0–44)
AST: 31 U/L (ref 15–41)
Albumin: 3.9 g/dL (ref 3.5–5.0)
Alkaline Phosphatase: 126 U/L (ref 38–126)
Anion gap: 14 (ref 5–15)
BUN: 34 mg/dL — ABNORMAL HIGH (ref 8–23)
CO2: 26 mmol/L (ref 22–32)
Calcium: 9.1 mg/dL (ref 8.9–10.3)
Chloride: 99 mmol/L (ref 98–111)
Creatinine, Ser: 1.05 mg/dL — ABNORMAL HIGH (ref 0.44–1.00)
GFR, Estimated: 55 mL/min — ABNORMAL LOW (ref >60)
Glucose, Bld: 226 mg/dL — ABNORMAL HIGH (ref 70–99)
Potassium: 3.6 mmol/L (ref 3.5–5.1)
Sodium: 139 mmol/L (ref 135–145)
Total Bilirubin: 0.9 mg/dL (ref 0.3–1.2)
Total Protein: 9.2 g/dL — ABNORMAL HIGH (ref 6.5–8.1)

## 2020-02-02 LAB — LACTIC ACID, PLASMA: Lactic Acid, Venous: 2.2 mmol/L (ref 0.5–1.9)

## 2020-02-02 LAB — APTT: aPTT: 46 seconds — ABNORMAL HIGH (ref 24–36)

## 2020-02-02 LAB — TROPONIN I (HIGH SENSITIVITY): Troponin I (High Sensitivity): 34 ng/L — ABNORMAL HIGH (ref <18)

## 2020-02-02 LAB — PROTIME-INR
INR: 1.3 — ABNORMAL HIGH (ref 0.8–1.2)
Prothrombin Time: 16.1 seconds — ABNORMAL HIGH (ref 11.4–15.2)

## 2020-02-02 LAB — CBG MONITORING, ED
Glucose-Capillary: 200 mg/dL — ABNORMAL HIGH (ref 70–99)
Glucose-Capillary: 204 mg/dL — ABNORMAL HIGH (ref 70–99)

## 2020-02-02 MED ORDER — INSULIN ASPART 100 UNIT/ML ~~LOC~~ SOLN
0.0000 [IU] | SUBCUTANEOUS | Status: DC
  Administered 2020-02-03: 3 [IU] via SUBCUTANEOUS
  Administered 2020-02-03: 05:00:00 5 [IU] via SUBCUTANEOUS
  Filled 2020-02-02 (×2): qty 1

## 2020-02-02 MED ORDER — LACTATED RINGERS IV BOLUS
1000.0000 mL | Freq: Once | INTRAVENOUS | Status: AC
  Administered 2020-02-03: 1000 mL via INTRAVENOUS

## 2020-02-02 MED ORDER — ACETAMINOPHEN 325 MG PO TABS
650.0000 mg | ORAL_TABLET | Freq: Once | ORAL | Status: AC | PRN
  Administered 2020-02-02: 650 mg via ORAL
  Filled 2020-02-02: qty 2

## 2020-02-02 NOTE — ED Provider Notes (Signed)
[facility] Emergency Department Provider Note  ____________________________________________   Event Date/Time   First MD Initiated Contact with Patient 02/02/20 2254     (approximate)  I have reviewed the triage vital signs and the nursing notes.   HISTORY  Chief Complaint Altered Mental Status   HPI Bethany Joseph is a 74 y.o. female with a past medical history of asthma, A. fib anticoagulated on Eliquis, CAD, GERD, DM, HTN, HDL, hypothyroidism, and aortic valve stenosis status post replacement who presents accompanied by husband for assessment of approximately 1 day of feeling poorly associated with subjective fevers at home, pain and redness in the left leg and some confusion per husband.  Patient states she is not sure what is wrong but she states she feels "bad".  She notes she has had some discomfort in her left leg but denies any other acute pain.  She denies any headache, earache, sore throat, chest pain, cough, shortness of breath, abdominal pain, back pain, urinary symptoms, upper extremity pain or other pain in left lower extremity other than where she has some redness in her medial calf area.  She denies any recent injuries or falls.  States he has been taking all of her medications as directed.  Denies any recent EtOH or illicit drug use.         Past Medical History:  Diagnosis Date  . Acquired hypothyroidism    Diagnosed with peripheral resistance  . Allergic rhinitis   . Aortic valve disorder   . Arrhythmia   . Arthritis   . Asthma   . Asthma without status asthmaticus    unspecified  . At risk for falls    uses cane, arthritis  . Atrial fibrillation (HCC)    Permanent at this time CHADSVASc=3 (age 4, DM, female) 05/30/09 - Afib at 107 4/25 A fib 88 with better rate control on Multaq, but dc Multaq since in permanent A Fib, refuses Coumadin therapy   . Avascular necrosis (HCC)    left foot  . Diabetes (HCC)   . Diabetic neuropathy  (HCC)    unspecified  . Difficult intubation    has a difficult intubation card  . Fracture of distal femur (HCC)    left periprosthetic, status post ORIF  . GERD (gastroesophageal reflux disease)   . Gout   . Heart murmur   . History of atrial fibrillation   . History of chicken pox   . Humerus distal fracture    left - [place] on 05/21/12  . Hyperkeratosis    Pre-ulcerative hyperkeratotic lesions first MTPJs  . Hyperlipidemia   . Hypertension   . Hyperthyroidism   . Mycotic toenails   . Osteoarthritis   . Osteoporosis, post-menopausal    Status post bilateral knee replacement. DEXA 09/2009: t=-1.6 spine, t=-3.1 right fem neck  . Paroxysmal tachycardia (HCC)    now in chronic A fib but rate is well controlled  . Type II diabetes mellitus [facility])     Patient Active Problem List   Diagnosis Date Noted  . Acute encephalopathy 02/03/2020  . Acquired hypothyroidism 10/22/2019  . Gout 10/22/2019  . Paroxysmal tachycardia (HCC) 10/22/2019  . Acute cholangitis   . Choledocholithiasis   . Stricture and stenosis of esophagus   . Sepsis (HCC) 08/23/2019  . S/P ORIF (open reduction internal fixation) fracture 08/05/2017  . Cerumen impaction 08/05/2017  . Humerus distal fracture   . Hip fracture (HCC) 07/15/2017  . Closed nondisplaced fracture of left pubis with  routine healing 09/18/2016  . Drug-induced constipation 07/24/2016  . Osteoporosis 07/24/2016  . Persistent atrial fibrillation (HCC) 07/24/2016  . Fracture of multiple pubic rami (HCC) 07/19/2016  . Leg pain 06/25/2016  . Varicose veins of lower extremities with ulcer (HCC) 03/26/2016  . Chronic venous insufficiency 03/26/2016  . Essential hypertension 03/26/2016  . Type 2 diabetes mellitus (HCC) 03/26/2016  . COPD (chronic obstructive pulmonary disease) (HCC) 03/26/2016  . Pain in femur 01/22/2013  . Fall 12/07/2012  . Aortic valve disorder 08/21/2012  . Generalized osteoarthritis of multiple sites 08/21/2012  . Status  post bilateral knee replacements 08/21/2012    Past Surgical History:  Procedure Laterality Date  . CHOLECYSTECTOMY    . ENDOSCOPIC RETROGRADE CHOLANGIOPANCREATOGRAPHY (ERCP) WITH PROPOFOL N/A 08/25/2019   Procedure: ENDOSCOPIC RETROGRADE CHOLANGIOPANCREATOGRAPHY (ERCP) WITH PROPOFOL;  Surgeon: Midge Minium, MD;  Location: [facility] ENDOSCOPY;  Service: Endoscopy;  Laterality: N/A;  . ERCP N/A 11/03/2019   Procedure: ENDOSCOPIC RETROGRADE CHOLANGIOPANCREATOGRAPHY (ERCP);  Surgeon: Midge Minium, MD;  Location: [facility] ENDOSCOPY;  Service: Endoscopy;  Laterality: N/A;  . FEMUR FRACTURE SURGERY Left 2010  . INTRAMEDULLARY (IM) NAIL INTERTROCHANTERIC Right 07/15/2017   Procedure: INTRAMEDULLARY (IM) NAIL INTERTROCHANTRIC;  Surgeon: Kennedy Bucker, MD;  Location: [facility] ORS;  Service: Orthopedics;  Laterality: Right;  . JOINT REPLACEMENT  2007   knee replacement surgeries  . ORIF FIBULA FRACTURE    . ORIF HUMERUS FRACTURE Left 05/28/2012   supracondylar distal  . REPLACEMENT TOTAL KNEE BILATERAL    . THYROIDECTOMY  1975   [place]  . TOTAL ABDOMINAL HYSTERECTOMY W/ BILATERAL SALPINGOOPHORECTOMY  1983   [place]  . TOTAL KNEE ARTHROPLASTY Bilateral     Prior to Admission medications   Medication Sig Start Date End Date Taking? Authorizing Provider  acetaminophen (TYLENOL) 325 MG tablet Take 650 mg by mouth every 4 (four) hours as needed.   Yes [provider]  Cholecalciferol 4000 units CAPS Take 1 capsule by mouth daily.   Yes [provider]  doxepin (SINEQUAN) 10 MG capsule Take 10 mg by mouth at bedtime. 08/03/19  Yes [provider]  ELIQUIS 5 MG TABS tablet Take 5 mg by mouth every 12 (twelve) hours. 06/24/17  Yes [provider]  gabapentin (NEURONTIN) 100 MG capsule Take 100 mg by mouth daily. 08/12/19  Yes [provider]  gabapentin (NEURONTIN) 400 MG capsule Take 400 mg by mouth at bedtime.   Yes [provider]  hydrochlorothiazide (HYDRODIURIL)  12.5 MG tablet Take 12.5 mg by mouth daily. 10/20/19  Yes [provider]  HYDROcodone-acetaminophen (NORCO/VICODIN) 5-325 MG tablet Take 1 tablet by mouth every 4 (four) hours as needed. 08/03/19  Yes [provider]  Lactobacillus (FLORAJEN ACIDOPHILUS PO) Take by mouth.   Yes [provider]  levothyroxine (SYNTHROID, LEVOTHROID) 25 MCG tablet Take 25 mcg by mouth daily before breakfast.   Yes [provider]  liothyronine (CYTOMEL) 25 MCG tablet Take 100 mcg by mouth daily. 4 tabs   Yes [provider]  loratadine (CLARITIN) 10 MG tablet Take 10 mg by mouth daily.   Yes [provider]  losartan (COZAAR) 50 MG tablet Take 50 mg by mouth daily. 10/12/19  Yes [provider]  Melatonin 10 MG TABS Take 1 tablet by mouth at bedtime as needed.   Yes [provider]  metFORMIN (GLUCOPHAGE-XR) 500 MG 24 hr tablet Take 1,000 mg by mouth every evening.   Yes [provider]  Omega 3 1200 MG CAPS Take 1,200  mg by mouth daily.   Yes [provider]  omeprazole (PRILOSEC) 40 MG capsule TAKE ONE CAPSULE BY MOUTH DAILY 06/13/15  Yes [provider]  potassium chloride SA (KLOR-CON) 20 MEQ tablet Take 20 mEq by mouth daily. 08/18/19  Yes [provider]  pravastatin (PRAVACHOL) 20 MG tablet Take 20 mg by mouth. 10/24/15  Yes [provider]  torsemide (DEMADEX) 20 MG tablet Take 20 mg by mouth 2 (two) times daily. 07/22/19  Yes [provider]  vitamin E 400 UNIT capsule Take 400 Units by mouth 3 (three) times daily.    Yes [provider]  zolpidem (AMBIEN) 5 MG tablet Take 5 mg by mouth at bedtime as needed for sleep.   Yes [provider]  albuterol (VENTOLIN HFA) 108 (90 Base) MCG/ACT inhaler USE 2 PUFFS TWICE DAILY AS NEEDED shortness of breath Patient not taking: Reported on 02/03/2020 04/02/14   [provider]  Bacillus Coagulans-Inulin (ALIGN PREBIOTIC-PROBIOTIC  PO) Take 1 capsule by mouth daily. Patient not taking: Reported on 02/03/2020    [provider]  bisacodyl (DULCOLAX) 10 MG suppository Place 10 mg rectally as needed for moderate constipation. Patient not taking: Reported on 02/03/2020    [provider]  nystatin (MYCOSTATIN) 100000 UNIT/ML suspension  09/03/19   [provider]  raloxifene (EVISTA) 60 MG tablet TAKE ONE (1) TABLET BY MOUTH EVERY DAY Patient not taking: No sig reported 02/22/16   [provider]  sennosides-docusate sodium (SENOKOT-S) 8.6-50 MG tablet Take 2 tablets by mouth 2 (two) times daily. Patient not taking: No sig reported    [provider]  simethicone (MYLICON) 80 MG chewable tablet Chew 1 tablet (80 mg total) by mouth every 6 (six) hours as needed for flatulence. Patient not taking: Reported on 02/03/2020 08/27/19   Tresa MooreSreenath, Sudheer B, MD    Allergies Neosporin [neomycin-polymyxin-gramicidin], Tape, Codeine, Latex, Neomycin-bacitracin zn-polymyx, and Penicillins  Family History  Problem Relation Age of Onset  . Colon cancer Mother   . Osteoporosis Mother   . Arthritis Mother   . Early death Mother   . Diabetes Father   . Hypertension Father   . Heart attack Father   . Coronary artery disease Father   . Early death Father   . Heart disease Father   . Hyperlipidemia Father   . Depression Brother   . Diabetes Brother   . Mental illness Brother   . Asthma Maternal Grandfather   . COPD Maternal Grandfather   . Asthma Maternal Aunt   . COPD Maternal Aunt   . Cancer Maternal Aunt     Social History Social History   Tobacco Use  . Smoking status: Former Smoker    Types: Cigarettes    Quit date: 02/13/1979    Years since quitting: 41.0  . Smokeless tobacco: Never Used  Vaping Use  . Vaping Use: Never used  Substance Use Topics  . Alcohol use: Yes    Alcohol/week: 14.0 standard drinks    Types: 14 Glasses of wine per week  . Drug use: No    Review  of Systems  Review of Systems  Constitutional: Negative for chills and fever.  HENT: Negative for sore throat.   Eyes: Negative for pain.  Respiratory: Negative for cough and stridor.   Cardiovascular: Negative for chest pain.  Gastrointestinal: Negative for vomiting.  Genitourinary: Negative for dysuria.  Musculoskeletal: Positive for myalgias ( L innner calf).  Skin: Negative for rash.  Neurological: Negative for  seizures, loss of consciousness and headaches.  Psychiatric/Behavioral: Positive for memory loss. Negative for suicidal ideas.  All other systems reviewed and are negative.     ____________________________________________   PHYSICAL EXAM:  VITAL SIGNS: ED Triage Vitals  Enc Vitals Group     BP 02/02/20 1932 (!) 148/67     Pulse Rate 02/02/20 1932 84     Resp 02/02/20 1932 20     Temp 02/02/20 1932 100.2 F (37.9 C)     Temp Source 02/02/20 1932 Oral     SpO2 02/02/20 1932 95 %     Weight 02/02/20 1938 175 lb 0.7 oz (79.4 kg)     Height 02/02/20 1938 5\' 7"  (1.702 m)     Head Circumference --      Peak Flow --      Pain Score --      Pain Loc --      Pain Edu? --      Excl. in GC? --    Vitals:   02/02/20 2246 02/03/20 0002  BP: (!) 159/80   Pulse: 85 89  Resp: 20 19  Temp: 99.3 F (37.4 C)   SpO2: 98% 96%   Physical Exam Vitals and nursing note reviewed.  Constitutional:      General: She is not in acute distress.    Appearance: She is well-developed and well-nourished.  HENT:     Head: Normocephalic and atraumatic.     Right Ear: External ear normal.     Left Ear: External ear normal.     Nose: Nose normal.     Mouth/Throat:     Mouth: Mucous membranes are dry.  Eyes:     Conjunctiva/sclera: Conjunctivae normal.  Cardiovascular:     Rate and Rhythm: Normal rate and regular rhythm.     Heart sounds: No murmur heard.   Pulmonary:     Effort: Pulmonary effort is normal. No respiratory distress.     Breath sounds: Normal breath sounds.   Abdominal:     Palpations: Abdomen is soft.     Tenderness: There is no abdominal tenderness.  Musculoskeletal:        General: No edema.     Cervical back: Neck supple.  Skin:    General: Skin is warm and dry.     Capillary Refill: Capillary refill takes less than 2 seconds.  Neurological:     Mental Status: She is alert and oriented to person, place, and time.  Psychiatric:        Mood and Affect: Mood and affect and mood normal.     Patient has symmetric strength in the bilateral upper and lower extremities.  Sensation is intact to light touch for all extremities.  Cranial nerves II through XII grossly intact.  Patient does have an area of petechial erythema over the medial aspect of the left inner calf proximal to the left knee.  There is no knee effusion or significant confluence around the knee joint.  She is 2+ bilateral radial and DP pulses.  She also has small ulcer without surrounding significant erythema streaking or edema on the posterior aspect of the left lower extremity.  She has a ulcer on the plantar aspect of the right foot at the base of the first and second toe.  There is no significant erythema streaking stranding or other tenderness over the right foot or right lower extremity.  Sensation intact light touch of the bilateral lower extremities. ____________________________________________   LABS (all labs ordered  are listed, but only abnormal results are displayed)  Labs Reviewed  COMPREHENSIVE METABOLIC PANEL - Abnormal; Notable for the following components:      Result Value   Glucose, Bld 226 (*)    BUN 34 (*)    Creatinine, Ser 1.05 (*)    Total Protein 9.2 (*)    GFR, Estimated 55 (*)    All other components within normal limits  CBC - Abnormal; Notable for the following components:   WBC 11.4 (*)    Hemoglobin 11.1 (*)    HCT 34.7 (*)    RDW 15.8 (*)    All other components within normal limits  LACTIC ACID, PLASMA - Abnormal; Notable for the  following components:   Lactic Acid, Venous 2.2 (*)    All other components within normal limits  URINALYSIS, COMPLETE (UACMP) WITH MICROSCOPIC - Abnormal; Notable for the following components:   Color, Urine YELLOW (*)    APPearance CLEAR (*)    Hgb urine dipstick MODERATE (*)    Protein, ur 100 (*)    All other components within normal limits  PROTIME-INR - Abnormal; Notable for the following components:   Prothrombin Time 16.1 (*)    INR 1.3 (*)    All other components within normal limits  APTT - Abnormal; Notable for the following components:   aPTT 46 (*)    All other components within normal limits  TSH - Abnormal; Notable for the following components:   TSH <0.010 (*)    All other components within normal limits  T4, FREE - Abnormal; Notable for the following components:   Free T4 <0.25 (*)    All other components within normal limits  CBG MONITORING, ED - Abnormal; Notable for the following components:   Glucose-Capillary 204 (*)    All other components within normal limits  CBG MONITORING, ED - Abnormal; Notable for the following components:   Glucose-Capillary 200 (*)    All other components within normal limits  TROPONIN I (HIGH SENSITIVITY) - Abnormal; Notable for the following components:   Troponin I (High Sensitivity) 34 (*)    All other components within normal limits  RESP PANEL BY RT-PCR (FLU A&B, COVID) ARPGX2  CULTURE, BLOOD (SINGLE)  URINE CULTURE  LACTIC ACID, PLASMA  AMMONIA  PROCALCITONIN  LACTATE DEHYDROGENASE  HEMOGLOBIN A1C  SEDIMENTATION RATE  VARICELLA-ZOSTER BY PCR  TROPONIN I (HIGH SENSITIVITY)   ____________________________________________  EKG  A. fib with a ventricular rate of 87, normal axis, unremarkable intervals, and some nonspecific changes in anterior leads and other clear evidence of acute ischemia. ____________________________________________  RADIOLOGY  ED MD interpretation: No overt edema, large effusion, focal  consolidation, no thorax, or other acute thoracic process.   Official radiology report(s): DG Chest 2 View  Result Date: 02/02/2020 CLINICAL DATA:  Increased weakness and altered mental status over several days EXAM: CHEST - 2 VIEW COMPARISON:  08/23/2019 FINDINGS: Frontal and lateral views of the chest demonstrate an unremarkable cardiac silhouette. Chronic central vascular congestion without airspace disease, effusion, or pneumothorax. No acute bony abnormalities. IMPRESSION: 1. No acute intrathoracic process. Electronically Signed   By: Sharlet Salina M.D.   On: 02/02/2020 20:36   CT Head Wo Contrast  Result Date: 02/03/2020 CLINICAL DATA:  Altered mental status. EXAM: CT HEAD WITHOUT CONTRAST TECHNIQUE: Contiguous axial images were obtained from the base of the skull through the vertex without intravenous contrast. COMPARISON:  August 23, 2019 FINDINGS: Brain: There is mild cerebral atrophy with widening of the  extra-axial spaces and ventricular dilatation. There are areas of decreased attenuation within the white matter tracts of the supratentorial brain, consistent with microvascular disease changes. Vascular: No hyperdense vessel or unexpected calcification. Skull: Normal. Negative for fracture or focal lesion. Sinuses/Orbits: No acute finding. Other: None. IMPRESSION: 1. Generalized cerebral atrophy. 2. No acute intracranial abnormality. Electronically Signed   By: Aram Candela M.D.   On: 02/03/2020 00:03    ____________________________________________   PROCEDURES  Procedure(s) performed (including Critical Care):  Procedures   ____________________________________________   INITIAL IMPRESSION / ASSESSMENT AND PLAN / ED COURSE      Patient presents above-stated exam for assessment of "feeling bad" as well as subjective fever and confusion and rash on her inner left lower leg.  Seems to have all started last 24 hours.  No recent falls injuries or obvious exposures per  husband.  Patient is able provide history above further history is somewhat limited but she is not oriented.   Differential includes encephalopathy secondary to sepsis versus cellulitis versus other source of infection, autoimmune vasculitis, metabolic derangements, intracranial hemorrhage, thyroid derangement, liver failure, and meningitis.  Chest x-ray obtained shows no evidence of pneumonia or other acute process.  CMP has evidence of hyperglycemia with a glucose of 226 and a T protein of 9.2 with no other significant ultralight or metabolic derangements.  WBC is slightly elevated 11.4 although patient's hemoglobin is 11.1 compared to 9.7 from 5 months ago is no evidence of acute anemia from prior CBCs.  Platelets are within normal limits at 2.5.  Initial lactic acid slightly elevated at 2.2.  This did downtrend to below 2 after IV fluids.  UA obtained does not appear infected.  Ammonia is unremarkable.  TSH and free T4 are both undetectable.  Patient states she missed 1 dose of her home Synthroid she was given a dose of this in the emergency room.  Troponin is slightly elevated and suspect this is related to some demand ischemia and lower suspicion for ACS at this time given patient appears dehydrated with an elevated lactic on arrival and denies any chest pain.  We will plan to trend the troponin.  Patient given a dose of Rocephin after blood cultures were obtained for possible cellulitis.  Patient will be admitted to medicine service for further evaluation management of encephalopathy.      ____________________________________________   FINAL CLINICAL IMPRESSION(S) / ED DIAGNOSES  Final diagnoses:  Altered mental status, unspecified altered mental status type  Rash  Troponin I above reference range  Lactic acid acidosis    Medications  insulin aspart (novoLOG) injection 0-15 Units (3 Units Subcutaneous Given 02/03/20 0000)  levothyroxine (SYNTHROID) tablet 25 mcg (has no administration  in time range)  cefTRIAXone (ROCEPHIN) 1 g in sodium chloride 0.9 % 100 mL IVPB (has no administration in time range)  acetaminophen (TYLENOL) tablet 650 mg (650 mg Oral Given 02/02/20 1946)  lactated ringers bolus 1,000 mL (1,000 mLs Intravenous New Bag/Given 02/03/20 0000)     ED Discharge Orders    None       Note:  This document was prepared using Dragon voice recognition software and may include unintentional dictation errors.   Gilles Chiquito, MD 02/03/20 [phone]

## 2020-02-02 NOTE — ED Triage Notes (Addendum)
Pt to ED reporting increasing weakness and altered mental status over the past couple days. Pt has had decreased PO intake, decreased mobility and increased redness to left leg. Wound to left heel with hx of DM. Pt needed assistance to get out of car in triage and was not keeping eyes open in triage.   Pt grabbing chest repeatedly in triage and breathing heavily. When husband was asked he stated her WOB looked increased from normal.   Pt not following commands well in triage.

## 2020-02-02 NOTE — ED Notes (Signed)
Pt taken to CT.

## 2020-02-03 ENCOUNTER — Encounter: Payer: Self-pay | Admitting: Family Medicine

## 2020-02-03 ENCOUNTER — Inpatient Hospital Stay
Admit: 2020-02-03 | Discharge: 2020-02-03 | Disposition: A | Discharge status: Home / Self Care | Payer: Medicare Other | Attending: Cardiology | Admitting: Cardiology

## 2020-02-03 ENCOUNTER — Other Ambulatory Visit: Payer: Self-pay

## 2020-02-03 DIAGNOSIS — A4189 Other specified sepsis: Secondary | ICD-10-CM | POA: Diagnosis present

## 2020-02-03 DIAGNOSIS — N289 Disorder of kidney and ureter, unspecified: Secondary | ICD-10-CM

## 2020-02-03 DIAGNOSIS — I5042 Chronic combined systolic (congestive) and diastolic (congestive) heart failure: Secondary | ICD-10-CM | POA: Diagnosis present

## 2020-02-03 DIAGNOSIS — J439 Emphysema, unspecified: Secondary | ICD-10-CM

## 2020-02-03 DIAGNOSIS — A400 Sepsis due to streptococcus, group A: Secondary | ICD-10-CM | POA: Diagnosis not present

## 2020-02-03 DIAGNOSIS — Z20822 Contact with and (suspected) exposure to covid-19: Secondary | ICD-10-CM | POA: Diagnosis present

## 2020-02-03 DIAGNOSIS — R21 Rash and other nonspecific skin eruption: Secondary | ICD-10-CM | POA: Diagnosis present

## 2020-02-03 DIAGNOSIS — I359 Nonrheumatic aortic valve disorder, unspecified: Secondary | ICD-10-CM | POA: Diagnosis present

## 2020-02-03 DIAGNOSIS — M1990 Unspecified osteoarthritis, unspecified site: Secondary | ICD-10-CM | POA: Diagnosis present

## 2020-02-03 DIAGNOSIS — G9341 Metabolic encephalopathy: Secondary | ICD-10-CM | POA: Diagnosis present

## 2020-02-03 DIAGNOSIS — L03116 Cellulitis of left lower limb: Secondary | ICD-10-CM | POA: Diagnosis present

## 2020-02-03 DIAGNOSIS — I5032 Chronic diastolic (congestive) heart failure: Secondary | ICD-10-CM | POA: Diagnosis not present

## 2020-02-03 DIAGNOSIS — E1140 Type 2 diabetes mellitus with diabetic neuropathy, unspecified: Secondary | ICD-10-CM | POA: Diagnosis present

## 2020-02-03 DIAGNOSIS — Z96653 Presence of artificial knee joint, bilateral: Secondary | ICD-10-CM | POA: Diagnosis present

## 2020-02-03 DIAGNOSIS — A409 Streptococcal sepsis, unspecified: Secondary | ICD-10-CM | POA: Diagnosis not present

## 2020-02-03 DIAGNOSIS — I4821 Permanent atrial fibrillation: Secondary | ICD-10-CM | POA: Diagnosis present

## 2020-02-03 DIAGNOSIS — K219 Gastro-esophageal reflux disease without esophagitis: Secondary | ICD-10-CM | POA: Diagnosis present

## 2020-02-03 DIAGNOSIS — E039 Hypothyroidism, unspecified: Secondary | ICD-10-CM

## 2020-02-03 DIAGNOSIS — I2510 Atherosclerotic heart disease of native coronary artery without angina pectoris: Secondary | ICD-10-CM | POA: Diagnosis present

## 2020-02-03 DIAGNOSIS — J309 Allergic rhinitis, unspecified: Secondary | ICD-10-CM | POA: Diagnosis present

## 2020-02-03 DIAGNOSIS — M109 Gout, unspecified: Secondary | ICD-10-CM | POA: Diagnosis present

## 2020-02-03 DIAGNOSIS — N179 Acute kidney failure, unspecified: Secondary | ICD-10-CM | POA: Insufficient documentation

## 2020-02-03 DIAGNOSIS — I214 Non-ST elevation (NSTEMI) myocardial infarction: Secondary | ICD-10-CM | POA: Diagnosis present

## 2020-02-03 DIAGNOSIS — I10 Essential (primary) hypertension: Secondary | ICD-10-CM

## 2020-02-03 DIAGNOSIS — G4700 Insomnia, unspecified: Secondary | ICD-10-CM | POA: Diagnosis present

## 2020-02-03 DIAGNOSIS — R778 Other specified abnormalities of plasma proteins: Secondary | ICD-10-CM | POA: Diagnosis not present

## 2020-02-03 DIAGNOSIS — G9340 Encephalopathy, unspecified: Secondary | ICD-10-CM | POA: Diagnosis present

## 2020-02-03 DIAGNOSIS — E11622 Type 2 diabetes mellitus with other skin ulcer: Secondary | ICD-10-CM

## 2020-02-03 DIAGNOSIS — I4819 Other persistent atrial fibrillation: Secondary | ICD-10-CM | POA: Diagnosis not present

## 2020-02-03 DIAGNOSIS — E872 Acidosis: Secondary | ICD-10-CM | POA: Diagnosis present

## 2020-02-03 DIAGNOSIS — M810 Age-related osteoporosis without current pathological fracture: Secondary | ICD-10-CM | POA: Diagnosis present

## 2020-02-03 DIAGNOSIS — E890 Postprocedural hypothyroidism: Secondary | ICD-10-CM | POA: Diagnosis present

## 2020-02-03 DIAGNOSIS — J449 Chronic obstructive pulmonary disease, unspecified: Secondary | ICD-10-CM | POA: Diagnosis present

## 2020-02-03 DIAGNOSIS — F32A Depression, unspecified: Secondary | ICD-10-CM

## 2020-02-03 DIAGNOSIS — I110 Hypertensive heart disease with heart failure: Secondary | ICD-10-CM | POA: Diagnosis present

## 2020-02-03 DIAGNOSIS — E785 Hyperlipidemia, unspecified: Secondary | ICD-10-CM | POA: Diagnosis present

## 2020-02-03 DIAGNOSIS — L03115 Cellulitis of right lower limb: Secondary | ICD-10-CM | POA: Diagnosis present

## 2020-02-03 LAB — PROCALCITONIN: Procalcitonin: 0.19 ng/mL

## 2020-02-03 LAB — HEMOGLOBIN A1C
Hgb A1c MFr Bld: 7.8 % — ABNORMAL HIGH (ref 4.8–5.6)
Mean Plasma Glucose: 177.16 mg/dL

## 2020-02-03 LAB — TSH: TSH: <0.01 u[IU]/mL — ABNORMAL LOW (ref 0.350–4.500)

## 2020-02-03 LAB — BLOOD CULTURE ID PANEL (REFLEXED) - BCID2

## 2020-02-03 LAB — T4, FREE: Free T4: <0.25 ng/dL — ABNORMAL LOW (ref 0.61–1.12)

## 2020-02-03 LAB — TROPONIN I (HIGH SENSITIVITY)
Troponin I (High Sensitivity): 86 ng/L — ABNORMAL HIGH (ref <18)
Troponin I (High Sensitivity): 940 ng/L (ref <18)
Troponin I (High Sensitivity): 1648 ng/L (ref <18)
Troponin I (High Sensitivity): 2134 ng/L (ref <18)

## 2020-02-03 LAB — GLUCOSE, CAPILLARY
Glucose-Capillary: 158 mg/dL — ABNORMAL HIGH (ref 70–99)
Glucose-Capillary: 160 mg/dL — ABNORMAL HIGH (ref 70–99)
Glucose-Capillary: 176 mg/dL — ABNORMAL HIGH (ref 70–99)
Glucose-Capillary: 203 mg/dL — ABNORMAL HIGH (ref 70–99)

## 2020-02-03 LAB — URINE CULTURE

## 2020-02-03 LAB — URINALYSIS, COMPLETE (UACMP) WITH MICROSCOPIC
Bacteria, UA: NONE SEEN
Bilirubin Urine: NEGATIVE
Glucose, UA: NEGATIVE mg/dL
Ketones, ur: NEGATIVE mg/dL
Leukocytes,Ua: NEGATIVE
Nitrite: NEGATIVE
Protein, ur: 100 mg/dL — AB
Specific Gravity, Urine: 1.014 (ref 1.005–1.030)
pH: 5 (ref 5.0–8.0)

## 2020-02-03 LAB — VITAMIN B12: Vitamin B-12: 179 pg/mL — ABNORMAL LOW (ref 180–914)

## 2020-02-03 LAB — BLOOD GAS, ARTERIAL
Acid-Base Excess: 2.1 mmol/L — ABNORMAL HIGH (ref 0.0–2.0)
Bicarbonate: 25 mmol/L (ref 20.0–28.0)
FIO2: 0.28
O2 Saturation: 95.5 %
Patient temperature: 37
pCO2 arterial: 32 mmHg (ref 32.0–48.0)
pH, Arterial: 7.5 — ABNORMAL HIGH (ref 7.350–7.450)
pO2, Arterial: 71 mmHg — ABNORMAL LOW (ref 83.0–108.0)

## 2020-02-03 LAB — SEDIMENTATION RATE: Sed Rate: 118 mm/hr — ABNORMAL HIGH (ref 0–22)

## 2020-02-03 LAB — HIV ANTIBODY (ROUTINE TESTING W REFLEX): HIV Screen 4th Generation wRfx: NONREACTIVE

## 2020-02-03 LAB — LACTATE DEHYDROGENASE: LDH: 127 U/L (ref 98–192)

## 2020-02-03 LAB — RESP PANEL BY RT-PCR (FLU A&B, COVID) ARPGX2
Influenza A by PCR: NEGATIVE
Influenza B by PCR: NEGATIVE
SARS Coronavirus 2 by RT PCR: NEGATIVE

## 2020-02-03 LAB — CBG MONITORING, ED: Glucose-Capillary: 203 mg/dL — ABNORMAL HIGH (ref 70–99)

## 2020-02-03 LAB — LACTIC ACID, PLASMA: Lactic Acid, Venous: 1.9 mmol/L (ref 0.5–1.9)

## 2020-02-03 LAB — AMMONIA: Ammonia: 11 umol/L (ref 9–35)

## 2020-02-03 MED ORDER — SODIUM CHLORIDE 0.9 % IV SOLN
1.0000 g | Freq: Once | INTRAVENOUS | Status: AC
  Administered 2020-02-03: 04:00:00 1 g via INTRAVENOUS
  Filled 2020-02-03: qty 10

## 2020-02-03 MED ORDER — ONDANSETRON HCL 4 MG/2ML IJ SOLN
4.0000 mg | Freq: Four times a day (QID) | INTRAMUSCULAR | Status: DC | PRN
  Administered 2020-02-05: 01:00:00 4 mg via INTRAVENOUS
  Filled 2020-02-03: qty 2

## 2020-02-03 MED ORDER — ORAL CARE MOUTH RINSE
15.0000 mL | Freq: Two times a day (BID) | OROMUCOSAL | Status: DC
  Administered 2020-02-03 – 2020-02-07 (×8): 15 mL via OROMUCOSAL

## 2020-02-03 MED ORDER — LIOTHYRONINE SODIUM 25 MCG PO TABS
100.0000 ug | ORAL_TABLET | Freq: Every day | ORAL | Status: DC
  Filled 2020-02-03: qty 4

## 2020-02-03 MED ORDER — SODIUM CHLORIDE 0.9 % IV SOLN
1.0000 g | INTRAVENOUS | Status: DC
  Filled 2020-02-03: qty 10

## 2020-02-03 MED ORDER — SODIUM CHLORIDE 0.9 % IV SOLN
2.0000 g | INTRAVENOUS | Status: DC
  Administered 2020-02-03 – 2020-02-06 (×4): 2 g via INTRAVENOUS
  Filled 2020-02-03 (×3): qty 2
  Filled 2020-02-03 (×3): qty 20

## 2020-02-03 MED ORDER — INSULIN ASPART 100 UNIT/ML ~~LOC~~ SOLN
0.0000 [IU] | Freq: Every day | SUBCUTANEOUS | Status: DC
  Administered 2020-02-03: 22:00:00 2 [IU] via SUBCUTANEOUS
  Filled 2020-02-03: qty 1

## 2020-02-03 MED ORDER — ACETAMINOPHEN 650 MG RE SUPP
650.0000 mg | Freq: Four times a day (QID) | RECTAL | Status: DC | PRN
  Administered 2020-02-03: 09:00:00 650 mg via RECTAL
  Filled 2020-02-03: qty 1

## 2020-02-03 MED ORDER — SENNOSIDES-DOCUSATE SODIUM 8.6-50 MG PO TABS: 1.0000 | ORAL_TABLET | Freq: Every evening | ORAL | Status: DC | PRN

## 2020-02-03 MED ORDER — LOSARTAN POTASSIUM 50 MG PO TABS
50.0000 mg | ORAL_TABLET | Freq: Every day | ORAL | Status: DC
  Administered 2020-02-03 – 2020-02-07 (×5): 50 mg via ORAL
  Filled 2020-02-03 (×5): qty 1

## 2020-02-03 MED ORDER — LEVOTHYROXINE SODIUM 50 MCG PO TABS
25.0000 ug | ORAL_TABLET | Freq: Every day | ORAL | Status: DC
  Administered 2020-02-03 – 2020-02-07 (×5): 25 ug via ORAL
  Filled 2020-02-03 (×5): qty 1

## 2020-02-03 MED ORDER — ONDANSETRON HCL 4 MG PO TABS
4.0000 mg | ORAL_TABLET | Freq: Four times a day (QID) | ORAL | Status: DC | PRN
  Administered 2020-02-05 – 2020-02-06 (×2): 4 mg via ORAL
  Filled 2020-02-03 (×2): qty 1

## 2020-02-03 MED ORDER — SODIUM CHLORIDE 0.9 % IV SOLN
250.0000 mL | INTRAVENOUS | Status: DC | PRN
Start: 2020-02-03 — End: 2020-02-07

## 2020-02-03 MED ORDER — METOPROLOL TARTRATE 5 MG/5ML IV SOLN
5.0000 mg | INTRAVENOUS | Status: DC | PRN
  Administered 2020-02-03: 23:00:00 5 mg via INTRAVENOUS
  Filled 2020-02-03: qty 5

## 2020-02-03 MED ORDER — VANCOMYCIN HCL 1250 MG/250ML IV SOLN
1250.0000 mg | INTRAVENOUS | Status: DC
  Administered 2020-02-04: 06:00:00 1250 mg via INTRAVENOUS
  Filled 2020-02-03: qty 250

## 2020-02-03 MED ORDER — APIXABAN 5 MG PO TABS
5.0000 mg | ORAL_TABLET | Freq: Two times a day (BID) | ORAL | Status: DC
  Administered 2020-02-03 – 2020-02-07 (×9): 5 mg via ORAL
  Filled 2020-02-03 (×9): qty 1

## 2020-02-03 MED ORDER — PRAVASTATIN SODIUM 20 MG PO TABS
20.0000 mg | ORAL_TABLET | Freq: Every day | ORAL | Status: DC
  Administered 2020-02-03 – 2020-02-06 (×4): 20 mg via ORAL
  Filled 2020-02-03 (×4): qty 1

## 2020-02-03 MED ORDER — LACTATED RINGERS IV SOLN: INTRAVENOUS | Status: DC

## 2020-02-03 MED ORDER — INSULIN ASPART 100 UNIT/ML ~~LOC~~ SOLN
0.0000 [IU] | Freq: Three times a day (TID) | SUBCUTANEOUS | Status: DC
  Administered 2020-02-03 – 2020-02-04 (×5): 2 [IU] via SUBCUTANEOUS
  Administered 2020-02-04: 17:00:00 1 [IU] via SUBCUTANEOUS
  Administered 2020-02-05 – 2020-02-07 (×7): 2 [IU] via SUBCUTANEOUS
  Filled 2020-02-03 (×13): qty 1

## 2020-02-03 MED ORDER — ACETAMINOPHEN 325 MG PO TABS
650.0000 mg | ORAL_TABLET | Freq: Four times a day (QID) | ORAL | Status: DC | PRN
  Filled 2020-02-03 (×2): qty 2

## 2020-02-03 MED ORDER — PANTOPRAZOLE SODIUM 40 MG PO TBEC
40.0000 mg | DELAYED_RELEASE_TABLET | Freq: Every day | ORAL | Status: DC
  Administered 2020-02-03 – 2020-02-07 (×5): 40 mg via ORAL
  Filled 2020-02-03 (×5): qty 1

## 2020-02-03 MED ORDER — MELATONIN 5 MG PO TABS
10.0000 mg | ORAL_TABLET | Freq: Every evening | ORAL | Status: DC | PRN
  Administered 2020-02-06: 23:00:00 10 mg via ORAL
  Filled 2020-02-03: qty 2

## 2020-02-03 MED ORDER — LIOTHYRONINE SODIUM 25 MCG PO TABS
75.0000 ug | ORAL_TABLET | Freq: Every day | ORAL | Status: DC
  Administered 2020-02-04 – 2020-02-07 (×4): 75 ug via ORAL
  Filled 2020-02-03 (×4): qty 3

## 2020-02-03 MED ORDER — ALBUTEROL SULFATE HFA 108 (90 BASE) MCG/ACT IN AERS
2.0000 | INHALATION_SPRAY | RESPIRATORY_TRACT | Status: DC | PRN
  Administered 2020-02-04 – 2020-02-05 (×2): 2 via RESPIRATORY_TRACT
  Filled 2020-02-03 (×2): qty 6.7

## 2020-02-03 MED ORDER — VANCOMYCIN HCL 1500 MG/300ML IV SOLN
1500.0000 mg | Freq: Once | INTRAVENOUS | Status: AC
  Administered 2020-02-03: 14:00:00 1500 mg via INTRAVENOUS
  Filled 2020-02-03: qty 300

## 2020-02-03 NOTE — Progress Notes (Signed)
*  PRELIMINARY RESULTS* Echocardiogram 2D Echocardiogram has been performed.  Cristela Blue 02/03/2020, 2:25 PM

## 2020-02-03 NOTE — Progress Notes (Signed)
Pharmacy Antibiotic Note  Bethany Joseph is a 74 y.o. female with PMH of hypothyroidism, atrial fibrillation on Eliquis, COPD, type 2 diabetes mellitus, chronic diastolic CHF, depression, insomnia, and hypertension admitted on 02/02/2020 with sepsis due to LLE cellulitis. She has a recent history of Klebsiella bacteremia in July 2021. Pharmacy has been consulted for vancomycin dosing.  Plan:  Start vancomycin  1500 mg IV vancomycin loading dose, then 1250 mg IV every 24 hours  Ke: 0.042 h-1, T1/2: 16.6 h  Css (calculated): 34.5 / 13.5 mcg/mL  Daily SCr while on IV vancomycin to assess renal function   Levels as clinically indicated  Height: 5\' 7"  (170.2 cm) Weight: 79.4 kg (175 lb 0.7 oz) IBW/kg (Calculated) : 61.6  Temp (24hrs), Avg:101 F (38.3 C), Min:99.3 F (37.4 C), Max:103.2 F (39.6 C)  Recent Labs  Lab 02/02/20 1948 02/02/20 2325  WBC 11.4*  --   CREATININE 1.05*  --   LATICACIDVEN 2.2* 1.9    Estimated Creatinine Clearance: 50.2 mL/min (A) (by C-G formula based on SCr of 1.05 mg/dL (H)).    Allergies  Allergen Reactions  . Neosporin [Neomycin-Polymyxin-Gramicidin] Hives  . Tape     Other reaction(s): Unknown Adhesive bandage  . Codeine Rash  . Latex Rash  . Neomycin-Bacitracin Zn-Polymyx Rash    Other reaction(s): Unknown DERMATOLOGICALS   . Penicillins Rash and Other (See Comments)    Has patient had a PCN reaction causing immediate rash, facial/tongue/throat swelling, SOB or lightheadedness with hypotension: Unknown Has patient had a PCN reaction causing severe rash involving mucus membranes or skin necrosis: Unknown Has patient had a PCN reaction that required hospitalization: Unknown Has patient had a PCN reaction occurring within the last 10 years: No If all of the above answers are "NO", then may proceed with Cephalosporin use.     Antimicrobials this admission: ceftriaxone 12/22 >>  vancomycin 12/22 >>   Microbiology results: 12/21 BCx:  1/2 Streptococcus sp (negative for S. pneumoniae, S. agalactiae, and S. pyogenes) 12/21 UCx: pending 12/21 SARS CoV-2: negative 12/21 influenza A/B: negative   Thank you for allowing pharmacy to be a part of this patient's care.  1/22 02/03/2020 10:17 AM

## 2020-02-03 NOTE — Progress Notes (Signed)
Secure chat to Dr. Chipper Herb with vitals, patient febrile at 103.1, HR irregular running at 106, Resp 34.

## 2020-02-03 NOTE — H&P (Signed)
History and Physical    Bethany Joseph VVO:[id number] DOB: 1946/01/23 DOA: 02/02/2020  PCP: Yisroel Ramming, MD   Patient coming from: Home   Chief Complaint: Left leg rash, confusion, lethargy   HPI: Bethany Joseph is a 74 y.o. female with medical history significant for hypothyroidism, atrial fibrillation on Eliquis, COPD, type 2 diabetes mellitus, chronic diastolic CHF, depression, insomnia, and hypertension, now presenting to the emergency department with pain and erythema involving the left lower extremity and 2 to 3 days of lethargy and confusion.  She is accompanied by her husband who assists with the history.  Patient has reportedly developed redness involving the medial left lower leg which has been warm and tender.  Over the past 2 to 3 days, the patient has been more lethargic, spending most of her days in bed, experiencing subjective fevers and chills, and confused.  There was no recent fall or trauma.  Patient has not been on antibiotics recently.  She has not complained of any chest pain.  ED Course: Upon arrival to the ED, patient is found to have a temperature of 37.9 C, normal O2 saturation, and stable blood pressure.  EKG features atrial fibrillation with PVCs.  Noncontrast head CT is negative for acute intracranial abnormality.  Chest x-rays negative for acute cardiopulmonary disease.  Chemistry panel notable for glucose 226 and creatinine 1.05, similar to earlier this month.  CBC features a slight leukocytosis and slight normocytic anemia.  Lactic acid was mildly elevated.  High-sensitivity troponin is 34.  COVID-19 PCR is negative.  Blood cultures were collected in the emergency department and the patient was treated with IV fluids, acetaminophen, and Rocephin.  Review of Systems:  All other systems reviewed and apart from HPI, are negative.  Past Medical History:  Diagnosis Date  . Acquired hypothyroidism    Diagnosed with peripheral resistance  . Allergic rhinitis    . Aortic valve disorder   . Arrhythmia   . Arthritis   . Asthma   . Asthma without status asthmaticus    unspecified  . At risk for falls    uses cane, arthritis  . Atrial fibrillation (HCC)    Permanent at this time CHADSVASc=3 (age 35, DM, female) 05/30/09 - Afib at 107 4/25 A fib 88 with better rate control on Multaq, but dc Multaq since in permanent A Fib, refuses Coumadin therapy   . Avascular necrosis (HCC)    left foot  . Diabetes (HCC)   . Diabetic neuropathy (HCC)    unspecified  . Difficult intubation    has a difficult intubation card  . Fracture of distal femur (HCC)    left periprosthetic, status post ORIF  . GERD (gastroesophageal reflux disease)   . Gout   . Heart murmur   . History of atrial fibrillation   . History of chicken pox   . Humerus distal fracture    left - [place] on 05/21/12  . Hyperkeratosis    Pre-ulcerative hyperkeratotic lesions first MTPJs  . Hyperlipidemia   . Hypertension   . Hyperthyroidism   . Mycotic toenails   . Osteoarthritis   . Osteoporosis, post-menopausal    Status post bilateral knee replacement. DEXA 09/2009: t=-1.6 spine, t=-3.1 right fem neck  . Paroxysmal tachycardia (HCC)    now in chronic A fib but rate is well controlled  . Type II diabetes mellitus (HCC)     Past Surgical History:  Procedure Laterality Date  . CHOLECYSTECTOMY    . ENDOSCOPIC RETROGRADE CHOLANGIOPANCREATOGRAPHY (ERCP)  WITH PROPOFOL N/A 08/25/2019   Procedure: ENDOSCOPIC RETROGRADE CHOLANGIOPANCREATOGRAPHY (ERCP) WITH PROPOFOL;  Surgeon: Midge MiniumWohl, Darren, MD;  [facility];  Service: Endoscopy;  Laterality: N/A;  . ERCP N/A 11/03/2019   Procedure: ENDOSCOPIC RETROGRADE CHOLANGIOPANCREATOGRAPHY (ERCP);  Surgeon: Midge MiniumWohl, Darren, MD;  [facility];  Service: Endoscopy;  Laterality: N/A;  . FEMUR FRACTURE SURGERY Left 2010  . INTRAMEDULLARY (IM) NAIL INTERTROCHANTERIC Right 07/15/2017   Procedure: INTRAMEDULLARY (IM) NAIL INTERTROCHANTRIC;   Surgeon: Kennedy BuckerMenz, Michael, MD;  Location: [facility] ORS;  Service: Orthopedics;  Laterality: Right;  . JOINT REPLACEMENT  2007   knee replacement surgeries  . ORIF FIBULA FRACTURE    . ORIF HUMERUS FRACTURE Left 05/28/2012   supracondylar distal  . REPLACEMENT TOTAL KNEE BILATERAL    . [place]  [place]  . TOTAL ABDOMINAL HYSTERECTOMY W/ BILATERAL SALPINGOOPHORECTOMY  1983   [place]  . TOTAL KNEE ARTHROPLASTY Bilateral     Social History:   reports that she quit smoking about 41 years ago. Her smoking use included cigarettes. She has never used smokeless tobacco. She reports current alcohol use of about 14.0 standard drinks of alcohol per week. She reports that she does not use drugs.  Allergies  Allergen Reactions  . Neosporin [Neomycin-Polymyxin-Gramicidin] Hives  . Tape     Other reaction(s): Unknown Adhesive bandage  . Codeine Rash  . Latex Rash  . Neomycin-Bacitracin Zn-Polymyx Rash    Other reaction(s): Unknown DERMATOLOGICALS   . Penicillins Rash and Other (See Comments)    Has patient had a PCN reaction causing immediate rash, facial/tongue/throat swelling, SOB or lightheadedness with hypotension: Unknown Has patient had a PCN reaction causing severe rash involving mucus membranes or skin necrosis: Unknown Has patient had a PCN reaction that required hospitalization: Unknown Has patient had a PCN reaction occurring within the last 10 years: No If all of the above answers are "NO", then may proceed with Cephalosporin use.     Family History  Problem Relation Age of Onset  . Colon cancer Mother   . Osteoporosis Mother   . Arthritis Mother   . Early death Mother   . Diabetes Father   . Hypertension Father   . Heart attack Father   . Coronary artery disease Father   . Early death Father   . Heart disease Father   . Hyperlipidemia Father   . Depression Brother   . Diabetes Brother   . Mental illness Brother   . Asthma Maternal Grandfather   . COPD Maternal  Grandfather   . Asthma Maternal Aunt   . COPD Maternal Aunt   . Cancer Maternal Aunt      Prior to Admission medications   Medication Sig Start Date End Date Taking? Authorizing Provider  acetaminophen (TYLENOL) 325 MG tablet Take 650 mg by mouth every 4 (four) hours as needed.   Yes [provider]  Cholecalciferol 4000 units CAPS Take 1 capsule by mouth daily.   Yes [provider]  doxepin (SINEQUAN) 10 MG capsule Take 10 mg by mouth at bedtime. 08/03/19  Yes [provider]  ELIQUIS 5 MG TABS tablet Take 5 mg by mouth every 12 (twelve) hours. 06/24/17  Yes [provider]  gabapentin (NEURONTIN) 100 MG capsule Take 100 mg by mouth daily. 08/12/19  Yes [provider]  gabapentin (NEURONTIN) 400 MG capsule Take 400 mg by mouth at bedtime.   Yes [provider]  hydrochlorothiazide (HYDRODIURIL) 12.5 MG tablet Take 12.5 mg by mouth daily.  10/20/19  Yes [provider]  HYDROcodone-acetaminophen (NORCO/VICODIN) 5-325 MG tablet Take 1 tablet by mouth every 4 (four) hours as needed. 08/03/19  Yes [provider]  Lactobacillus (FLORAJEN ACIDOPHILUS PO) Take by mouth.   Yes [provider]  levothyroxine (SYNTHROID, LEVOTHROID) 25 MCG tablet Take 25 mcg by mouth daily before breakfast.   Yes [provider]  liothyronine (CYTOMEL) 25 MCG tablet Take 100 mcg by mouth daily. 4 tabs   Yes [provider]  loratadine (CLARITIN) 10 MG tablet Take 10 mg by mouth daily.   Yes [provider]  losartan (COZAAR) 50 MG tablet Take 50 mg by mouth daily. 10/12/19  Yes [provider]  Melatonin 10 MG TABS Take 1 tablet by mouth at bedtime as needed.   Yes [provider]  metFORMIN (GLUCOPHAGE-XR) 500 MG 24 hr tablet Take 1,000 mg by mouth every evening.   Yes [provider]  Omega 3 1200 MG CAPS Take 1,200 mg by mouth daily.   Yes [provider]  omeprazole (PRILOSEC)  40 MG capsule TAKE ONE CAPSULE BY MOUTH DAILY 06/13/15  Yes [provider]  potassium chloride SA (KLOR-CON) 20 MEQ tablet Take 20 mEq by mouth daily. 08/18/19  Yes [provider]  pravastatin (PRAVACHOL) 20 MG tablet Take 20 mg by mouth. 10/24/15  Yes [provider]  torsemide (DEMADEX) 20 MG tablet Take 20 mg by mouth 2 (two) times daily. 07/22/19  Yes [provider]  vitamin E 400 UNIT capsule Take 400 Units by mouth 3 (three) times daily.    Yes [provider]  zolpidem (AMBIEN) 5 MG tablet Take 5 mg by mouth at bedtime as needed for sleep.   Yes [provider]  albuterol (VENTOLIN HFA) 108 (90 Base) MCG/ACT inhaler USE 2 PUFFS TWICE DAILY AS NEEDED shortness of breath Patient not taking: Reported on 02/03/2020 04/02/14   [provider]  Bacillus Coagulans-Inulin (ALIGN PREBIOTIC-PROBIOTIC PO) Take 1 capsule by mouth daily. Patient not taking: Reported on 02/03/2020    [provider]  bisacodyl (DULCOLAX) 10 MG suppository Place 10 mg rectally as needed for moderate constipation. Patient not taking: Reported on 02/03/2020    [provider]  nystatin (MYCOSTATIN) 100000 UNIT/ML suspension  09/03/19   [provider]  raloxifene (EVISTA) 60 MG tablet TAKE ONE (1) TABLET BY MOUTH EVERY DAY Patient not taking: No sig reported 02/22/16   [provider]  sennosides-docusate sodium (SENOKOT-S) 8.6-50 MG tablet Take 2 tablets by mouth 2 (two) times daily. Patient not taking: No sig reported    [provider]  simethicone (MYLICON) 80 MG chewable tablet Chew 1 tablet (80 mg total) by mouth every 6 (six) hours as needed for flatulence. Patient not taking: Reported on 02/03/2020 08/27/19   Tresa Moore, MD    Physical Exam: Vitals:   02/02/20 1932 02/02/20 1938 02/02/20 2246 02/03/20 0002  BP: (!) 148/67  (!) 159/80   Pulse: 84  85 89  Resp: 20  20 19   Temp: 100.2 F (37.9 C)  99.3  F (37.4 C)   TempSrc: Oral  Oral   SpO2: 95%  98% 96%  Weight:  79.4 kg    Height:  5\' 7"  (1.702 m)      Constitutional: NAD, calm  Eyes: PERTLA, lids and conjunctivae normal ENMT: Mucous membranes are dry. Posterior pharynx clear of any exudate or lesions.   Neck: normal, supple, no masses, no thyromegaly Respiratory: Mild  tachypnea, no wheezing, no crackles. No pallor or cyanosis.    Cardiovascular: Rate ~80 and irregularly irregular. No significant JVD. Abdomen: No distension, no tenderness, soft. Bowel sounds active.  Musculoskeletal: no clubbing / cyanosis. No joint deformity upper and lower extremities.   Skin: Erythema, warmth, and excoriations involving medial left leg. Warm, dry, well-perfused. Neurologic: CN 2-12 grossly intact. Sensation intact. Moving all extremities.  Psychiatric: Awake, makes eye contact. Not consistently answering basic questions.     Labs and Imaging on Admission: I have personally reviewed following labs and imaging studies  CBC: Recent Labs  Lab 02/02/20 1948  WBC 11.4*  HGB 11.1*  HCT 34.7*  MCV 85.9  PLT 225   Basic Metabolic Panel: Recent Labs  Lab 02/02/20 1948  NA 139  K 3.6  CL 99  CO2 26  GLUCOSE 226*  BUN 34*  CREATININE 1.05*  CALCIUM 9.1   GFR: Estimated Creatinine Clearance: 50.2 mL/min (A) (by C-G formula based on SCr of 1.05 mg/dL (H)). Liver Function Tests: Recent Labs  Lab 02/02/20 1948  AST 31  ALT 18  ALKPHOS 126  BILITOT 0.9  PROT 9.2*  ALBUMIN 3.9   No results for input(s): LIPASE, AMYLASE in the last 168 hours. Recent Labs  Lab 02/02/20 2326  AMMONIA 11   Coagulation Profile: Recent Labs  Lab 02/02/20 1948  INR 1.3*   Cardiac Enzymes: No results for input(s): CKTOTAL, CKMB, CKMBINDEX, TROPONINI in the last 168 hours. BNP (last 3 results) No results for input(s): PROBNP in the last 8760 hours. HbA1C: Recent Labs    02/02/20 1948  HGBA1C 7.8*   CBG: Recent Labs  Lab 02/02/20 2000  02/02/20 2345 02/03/20 0416  GLUCAP 204* 200* 203*   Lipid Profile: No results for input(s): CHOL, HDL, LDLCALC, TRIG, CHOLHDL, LDLDIRECT in the last 72 hours. Thyroid Function Tests: Recent Labs    02/02/20 1948 02/02/20 2328  TSH <0.010*  --   FREET4  --  <0.25*   Anemia Panel: No results for input(s): VITAMINB12, FOLATE, FERRITIN, TIBC, IRON, RETICCTPCT in the last 72 hours. Urine analysis:    Component Value Date/Time   COLORURINE YELLOW (A) 02/02/2020 1306   APPEARANCEUR CLEAR (A) 02/02/2020 1306   LABSPEC 1.014 02/02/2020 1306   PHURINE 5.0 02/02/2020 1306   GLUCOSEU NEGATIVE 02/02/2020 1306   HGBUR MODERATE (A) 02/02/2020 1306   BILIRUBINUR NEGATIVE 02/02/2020 1306   KETONESUR NEGATIVE 02/02/2020 1306   PROTEINUR 100 (A) 02/02/2020 1306   NITRITE NEGATIVE 02/02/2020 1306   LEUKOCYTESUR NEGATIVE 02/02/2020 1306   Sepsis Labs: (procalcitonin:4,lacticidven:4) ) Recent Results (from the past 240 hour(s))  Resp Panel by RT-PCR (Flu A&B, Covid) Nasopharyngeal Swab     Status: None   Collection Time: 02/02/20 11:24 PM   Specimen: Nasopharyngeal Swab; Nasopharyngeal(NP) swabs in vial transport medium  Result Value Ref Range Status   SARS Coronavirus 2 by RT PCR NEGATIVE NEGATIVE Final    Comment: (NOTE) SARS-CoV-2 target nucleic acids are NOT DETECTED.  The SARS-CoV-2 RNA is generally detectable in upper respiratory specimens during the acute phase of infection. The lowest concentration of SARS-CoV-2 viral copies this assay can detect is 138 copies/mL. A negative result does not preclude SARS-Cov-2 infection and should not be used as the sole basis for treatment or other patient management decisions. A negative result may occur with  improper specimen collection/handling, submission of specimen other than nasopharyngeal swab, presence of viral mutation(s) within the areas targeted by this assay, and inadequate number of viral  copies(<138 copies/mL). A  negative result must be combined with clinical observations, patient history, and epidemiological information. The expected result is Negative.  Fact Sheet for Patients:  BloggerCourse.com  Fact Sheet for Healthcare Providers:  SeriousBroker.it  This test is no t yet approved or cleared by the [place] FDA and  has been authorized for detection and/or diagnosis of SARS-CoV-2 by FDA under an Emergency Use Authorization (EUA). This EUA will remain  in effect (meaning this test can be used) for the duration of the COVID-19 declaration under Section 564(b)(1) of the Act, 21 U.S.C.section 360bbb-3(b)(1), unless the authorization is terminated  or revoked sooner.       Influenza A by PCR NEGATIVE NEGATIVE Final   Influenza B by PCR NEGATIVE NEGATIVE Final    Comment: (NOTE) The Xpert Xpress SARS-CoV-2/FLU/RSV plus assay is intended as an aid in the diagnosis of influenza from Nasopharyngeal swab specimens and should not be used as a sole basis for treatment. Nasal washings and aspirates are unacceptable for Xpert Xpress SARS-CoV-2/FLU/RSV testing.  Fact Sheet for Patients: BloggerCourse.com  Fact Sheet for Healthcare Providers: SeriousBroker.it  This test is not yet approved or cleared by the [place] FDA and has been authorized for detection and/or diagnosis of SARS-CoV-2 by FDA under an Emergency Use Authorization (EUA). This EUA will remain in effect (meaning this test can be used) for the duration of the COVID-19 declaration under Section 564(b)(1) of the Act, 21 U.S.C. section 360bbb-3(b)(1), unless the authorization is terminated or revoked.  Performed at [facility], [address] [place], [place] [address]      Radiological Exams on Admission: DG Chest 2 View  Result Date: 02/02/2020 CLINICAL DATA:  Increased weakness and altered mental status  over several days EXAM: CHEST - 2 VIEW COMPARISON:  08/23/2019 FINDINGS: Frontal and lateral views of the chest demonstrate an unremarkable cardiac silhouette. Chronic central vascular congestion without airspace disease, effusion, or pneumothorax. No acute bony abnormalities. IMPRESSION: 1. No acute intrathoracic process. Electronically Signed   By: Sharlet Salina M.D.   On: 02/02/2020 20:36   CT Head Wo Contrast  Result Date: 02/03/2020 CLINICAL DATA:  Altered mental status. EXAM: CT HEAD WITHOUT CONTRAST TECHNIQUE: Contiguous axial images were obtained from the base of the skull through the vertex without intravenous contrast. COMPARISON:  August 23, 2019 FINDINGS: Brain: There is mild cerebral atrophy with widening of the extra-axial spaces and ventricular dilatation. There are areas of decreased attenuation within the white matter tracts of the supratentorial brain, consistent with microvascular disease changes. Vascular: No hyperdense vessel or unexpected calcification. Skull: Normal. Negative for fracture or focal lesion. Sinuses/Orbits: No acute finding. Other: None. IMPRESSION: 1. Generalized cerebral atrophy. 2. No acute intracranial abnormality. Electronically Signed   By: Aram Candela M.D.   On: 02/03/2020 00:03    EKG: Independently reviewed. Atrial fibrillation, PVCs.   Assessment/Plan   1. Acute encephalopathy  - Presents with 2-3 days of confusion, lethargy, sleeping more  - No acute findings on head CT, ammonia level norma, TSH and T4 both undetectable which appears to be the case going back to at least 2014 and she does not have hypotension, hypothermia, hyponatremia, or hypoglycemia to suggest myxedema coma  - She presented with AMS in July when she was admitted with acute cholangitis and this could be related to her infection; polypharmacy also a concern  - Treat infection, hold Ambien, doxepin, Norco, and gabapentin, check B12 and RPR, continue supportive care   2.  Cellulitis  - Presents with moderate non-purulent cellulitis involving left leg, does not meet sepsis criteria  - Cultured and started on Rocphin in ED, will continue Rocephin and follow   3. Hypothyroidism  - Continue Synthroid and Cytomel    4. Elevated troponin  - HS troponin 34 and then 86 in ED  - No anginal complaints and EKG not significantly changed  - Continue cardiac monitoring, trend troponin, continue statin and Eliquis    5. Chronic diastolic CHF  - Appears compensated  - Monitor volume status   6. Atrial fibrillation  - CHADS-VASc at least 2 (age x2, gender, DM, HTN, CHF)  - Continue Eliquis   7. Hypertension  - Continue losartan, hold HCTZ initially in setting of infection with elevated lactate    8. COPD  - Appears dyspneic without cough or wheeze on admission  - Continue albuterol as needed    9. Type II DM  - A1c was 7.8% in ED  - Check CBGs and use SSI for now    10. Depression, insomnia  - Holding doxepin and insomnia while working-up encephalopathy    11. Chronic pain  - No pain complaints on admission  - Hold Norco and gabapentin until mental status improves    DVT prophylaxis: Eliquis  Code Status: Full  Family Communication: Discussed with husband at bedside Disposition Plan:  Patient is from: Home  Anticipated d/c is to: TBD Anticipated d/c date is: 02/06/20 Patient currently: Pending improvement in mental status, additional lab work pending  Consults called: None  Admission status: Inpatient     Briscoe Deutscher, MD [facility]  02/03/2020, 5:26 AM

## 2020-02-03 NOTE — Progress Notes (Signed)
   02/03/20 2310  Assess: MEWS Score  Temp 98.8 F (37.1 C)  BP (!) 166/92  Pulse Rate (!) 111  Resp (!) 24  SpO2 96 %  O2 Device Nasal Cannula  O2 Flow Rate (L/min) 3 L/min  Assess: MEWS Score  MEWS Temp 0  MEWS Systolic 0  MEWS Pulse 2  MEWS RR 1  MEWS LOC 0  MEWS Score 3  MEWS Score Color Yellow  Assess: if the MEWS score is Yellow or Red  Were vital signs taken at a resting state? Yes  Focused Assessment Change from prior assessment (see assessment flowsheet)  Early Detection of Sepsis Score *See Row Information* Medium  MEWS guidelines implemented *See Row Information* No, previously yellow, continue vital signs every 4 hours  Treat  MEWS Interventions Administered prn meds/treatments  Take Vital Signs  Increase Vital Sign Frequency  Yellow: Q 2hr X 2 then Q 4hr X 2, if remains yellow, continue Q 4hrs  Escalate  MEWS: Escalate Yellow: discuss with charge nurse/RN and consider discussing with provider and RRT  Notify: Charge Nurse/RN  Name of Charge Nurse/RN Notified Barney Drain, RN  Date Charge Nurse/RN Notified 02/03/20  Time Charge Nurse/RN Notified 2330  Notify: Provider  Provider Name/Title Webb Silversmith, NP  Date Provider Notified 02/03/20  Time Provider Notified 2333  Notification Type  (text)  Notification Reason Change in status  Response See new orders  Document  Patient Outcome Stabilized after interventions  Patient has Afib so her heart rate fluctuates. I gave her some metoprolol and told her to take slow breaths because she is breathing through her mouth. She has some expiratory wheezing so I will contact [facility] regarding her continuous fluids. She also has a history of CHF.

## 2020-02-03 NOTE — ED Notes (Signed)
Encephalopathy, ?cellulitis, hypothyroidism    Gilles Chiquito, MD 02/03/20 [phone]

## 2020-02-03 NOTE — Consult Note (Signed)
[facility] Card[facility]  CARDIOLOGY CONSULT NOTE  Patient ID: Bethany Joseph MRN: [id number] DOB/AGE: [birth year] 74 y.o.  Admit date: 02/02/2020 Referring Physician Chipper Herb Primary Physician [facility] Primary Cardiologist [facility] Reason for Consultation elevated troponin  HPI: 74 year old female referred for evaluation of elevated troponin.  The patient has a history of chronic atrial fibrillation and chronic diastolic congestive heart failure followed by Dr. Juliann Pares.  She was in her usual state of health until approximately 2 to 3 days ago when she started to experiencing increasing mental status, worsening lethargy and confusion.  The patient was also experiencing redness on the medial aspect of her left lower leg.  Patient presented to [facility] ED where ECG revealed atrial fibrillation at controlled rate of 87 bpm without ischemic ST-T wave changes.  Head CT was unremarkable.  Patient is admitted with acute encephalopathy felt to be secondary to cellulitis of left lower leg.  Troponins are elevated ( 53,61,443 ) in the absence of chest pain or acute ischemic ECG changes.  Patient continues to be confused, not able to answer questions appropriately.  She is febrile with a temperature of 103.  Review of systems complete and found to be negative unless listed above     Past Medical History:  Diagnosis Date  . Acquired hypothyroidism    Diagnosed with peripheral resistance  . Allergic rhinitis   . Aortic valve disorder   . Arrhythmia   . Arthritis   . Asthma   . Asthma without status asthmaticus    unspecified  . At risk for falls    uses cane, arthritis  . Atrial fibrillation (HCC)    Permanent at this time CHADSVASc=3 (age 31, DM, female) 05/30/09 - Afib at 107 4/25 A fib 88 with better rate control on Multaq, but dc Multaq since in permanent A Fib, refuses Coumadin therapy   . Avascular necrosis (HCC)    left foot  . Diabetes (HCC)   . Diabetic neuropathy (HCC)    unspecified  . Difficult  intubation    has a difficult intubation card  . Fracture of distal femur (HCC)    left periprosthetic, status post ORIF  . GERD (gastroesophageal reflux disease)   . Gout   . Heart murmur   . History of atrial fibrillation   . History of chicken pox   . Humerus distal fracture    left - [place] on 05/21/12  . Hyperkeratosis    Pre-ulcerative hyperkeratotic lesions first MTPJs  . Hyperlipidemia   . Hypertension   . Hyperthyroidism   . Mycotic toenails   . Osteoarthritis   . Osteoporosis, post-menopausal    Status post bilateral knee replacement. DEXA 09/2009: t=-1.6 spine, t=-3.1 right fem neck  . Paroxysmal tachycardia (HCC)    now in chronic A fib but rate is well controlled  . Type II diabetes mellitus (HCC)     Past Surgical History:  Procedure Laterality Date  . CHOLECYSTECTOMY    . ENDOSCOPIC RETROGRADE CHOLANGIOPANCREATOGRAPHY (ERCP) WITH PROPOFOL N/A 08/25/2019   Procedure: ENDOSCOPIC RETROGRADE CHOLANGIOPANCREATOGRAPHY (ERCP) WITH PROPOFOL;  Surgeon: Midge Minium, MD;  Location: [facility] ENDOSCOPY;  Service: Endoscopy;  Laterality: N/A;  . ERCP N/A 11/03/2019   Procedure: ENDOSCOPIC RETROGRADE CHOLANGIOPANCREATOGRAPHY (ERCP);  Surgeon: Midge Minium, MD;  Location: [facility] ENDOSCOPY;  Service: Endoscopy;  Laterality: N/A;  . FEMUR FRACTURE SURGERY Left 2010  . INTRAMEDULLARY (IM) NAIL INTERTROCHANTERIC Right 07/15/2017   Procedure: INTRAMEDULLARY (IM) NAIL INTERTROCHANTRIC;  Surgeon: Kennedy Bucker, MD;  Location: [facility] ORS;  Service: Orthopedics;  Laterality:  Right;  Marland Kitchen JOINT REPLACEMENT  2007   knee replacement surgeries  . ORIF FIBULA FRACTURE    . ORIF HUMERUS FRACTURE Left 05/28/2012   supracondylar distal  . REPLACEMENT TOTAL KNEE BILATERAL    . THYROIDECTOMY  1975   [place]  . TOTAL ABDOMINAL HYSTERECTOMY W/ BILATERAL SALPINGOOPHORECTOMY  1983   [place]  . TOTAL KNEE ARTHROPLASTY Bilateral     Medications Prior to Admission  Medication Sig Dispense Refill Last Dose  .  acetaminophen (TYLENOL) 325 MG tablet Take 650 mg by mouth every 4 (four) hours as needed.   prn at prn  . Cholecalciferol 4000 units CAPS Take 1 capsule by mouth daily.   Past Week at Unknown time  . doxepin (SINEQUAN) 10 MG capsule Take 10 mg by mouth at bedtime.   Past Week at Unknown time  . ELIQUIS 5 MG TABS tablet Take 5 mg by mouth every 12 (twelve) hours.  11 02/02/2020 at Unknown time  . gabapentin (NEURONTIN) 100 MG capsule Take 100 mg by mouth daily.   Past Week at Unknown time  . gabapentin (NEURONTIN) 400 MG capsule Take 400 mg by mouth at bedtime.   Past Week at Unknown time  . hydrochlorothiazide (HYDRODIURIL) 12.5 MG tablet Take 12.5 mg by mouth daily.   Past Week at Unknown time  . HYDROcodone-acetaminophen (NORCO/VICODIN) 5-325 MG tablet Take 1 tablet by mouth every 4 (four) hours as needed.   prn at prn  . Lactobacillus (FLORAJEN ACIDOPHILUS PO) Take by mouth.   Past Week at Unknown time  . levothyroxine (SYNTHROID, LEVOTHROID) 25 MCG tablet Take 25 mcg by mouth daily before breakfast.   Past Week at Unknown time  . liothyronine (CYTOMEL) 25 MCG tablet Take 100 mcg by mouth daily. 4 tabs   Past Week at Unknown time  . loratadine (CLARITIN) 10 MG tablet Take 10 mg by mouth daily.   Past Week at Unknown time  . losartan (COZAAR) 50 MG tablet Take 50 mg by mouth daily.   Past Week at Unknown time  . Melatonin 10 MG TABS Take 1 tablet by mouth at bedtime as needed.   prn at prn  . metFORMIN (GLUCOPHAGE-XR) 500 MG 24 hr tablet Take 1,000 mg by mouth every evening.   Past Week at Unknown time  . Omega 3 1200 MG CAPS Take 1,200 mg by mouth daily.   Past Week at Unknown time  . omeprazole (PRILOSEC) 40 MG capsule TAKE ONE CAPSULE BY MOUTH DAILY   Past Week at Unknown time  . potassium chloride SA (KLOR-CON) 20 MEQ tablet Take 20 mEq by mouth daily.   Past Week at Unknown time  . pravastatin (PRAVACHOL) 20 MG tablet Take 20 mg by mouth.   Past Week at Unknown time  . torsemide (DEMADEX)  20 MG tablet Take 20 mg by mouth 2 (two) times daily.   Past Week at Unknown time  . vitamin E 400 UNIT capsule Take 400 Units by mouth 3 (three) times daily.    Past Week at Unknown time  . zolpidem (AMBIEN) 5 MG tablet Take 5 mg by mouth at bedtime as needed for sleep.   prn at prn  . albuterol (VENTOLIN HFA) 108 (90 Base) MCG/ACT inhaler USE 2 PUFFS TWICE DAILY AS NEEDED shortness of breath (Patient not taking: Reported on 02/03/2020)   Not Taking at prn  . Bacillus Coagulans-Inulin (ALIGN PREBIOTIC-PROBIOTIC PO) Take 1 capsule by mouth daily. (Patient not taking: Reported on 02/03/2020)   Not  Taking at Unknown time  . bisacodyl (DULCOLAX) 10 MG suppository Place 10 mg rectally as needed for moderate constipation. (Patient not taking: Reported on 02/03/2020)   Not Taking at Unknown time  . nystatin (MYCOSTATIN) 100000 UNIT/ML suspension  (Patient not taking: Reported on 02/03/2020)   Not Taking at Unknown time  . raloxifene (EVISTA) 60 MG tablet TAKE ONE (1) TABLET BY MOUTH EVERY DAY (Patient not taking: No sig reported)   Not Taking at Unknown time  . sennosides-docusate sodium (SENOKOT-S) 8.6-50 MG tablet Take 2 tablets by mouth 2 (two) times daily. (Patient not taking: No sig reported)   Not Taking at Unknown time  . simethicone (MYLICON) 80 MG chewable tablet Chew 1 tablet (80 mg total) by mouth every 6 (six) hours as needed for flatulence. (Patient not taking: Reported on 02/03/2020) 30 tablet 0 Not Taking at Unknown time   Social History   Socioeconomic History  . Marital status: Married    Spouse name: Molly MaduroRobert  . Number of children: 2  . Years of education: 612  . Highest education level: High school graduate  Occupational History  . Not on file  Tobacco Use  . Smoking status: Former Smoker    Types: Cigarettes    Quit date: 02/13/1979    Years since quitting: 41.0  . Smokeless tobacco: Never Used  Vaping Use  . Vaping Use: Never used  Substance and Sexual Activity  . Alcohol use:  Yes    Alcohol/week: 14.0 standard drinks    Types: 14 Glasses of wine per week  . Drug use: No  . Sexual activity: Not Currently  Other Topics Concern  . Not on file  Social History Narrative  . Not on file   Social Determinants of Health   Financial Resource Strain: Not on file  Food Insecurity: Not on file  Transportation Needs: Not on file  Physical Activity: Not on file  Stress: Not on file  Social Connections: Not on file  Intimate Partner Violence: Not on file    Family History  Problem Relation Age of Onset  . Colon cancer Mother   . Osteoporosis Mother   . Arthritis Mother   . Early death Mother   . Diabetes Father   . Hypertension Father   . Heart attack Father   . Coronary artery disease Father   . Early death Father   . Heart disease Father   . Hyperlipidemia Father   . Depression Brother   . Diabetes Brother   . Mental illness Brother   . Asthma Maternal Grandfather   . COPD Maternal Grandfather   . Asthma Maternal Aunt   . COPD Maternal Aunt   . Cancer Maternal Aunt       Review of systems complete and found to be negative unless listed above      PHYSICAL EXAM  General: Well developed, well nourished, in no acute distress HEENT:  Normocephalic and atramatic Neck:  No JVD.  Lungs: Clear bilaterally to auscultation and percussion. Heart: HRRR . Normal S1 and S2 without gallops or murmurs.  Abdomen: Bowel sounds are positive, abdomen soft and non-tender  Msk:  Back normal, normal gait. Normal strength and tone for age. Extremities: No clubbing, cyanosis or edema.   Neuro: Alert and oriented X 3. Psych:  Good affect, responds appropriately  Labs:   Lab Results  Component Value Date   WBC 11.4 (H) 02/02/2020   HGB 11.1 (L) 02/02/2020   HCT 34.7 (L) 02/02/2020  MCV 85.9 02/02/2020   PLT 225 02/02/2020    Recent Labs  Lab 02/02/20 1948  NA 139  K 3.6  CL 99  CO2 26  BUN 34*  CREATININE 1.05*  CALCIUM 9.1  PROT 9.2*  BILITOT  0.9  ALKPHOS 126  ALT 18  AST 31  GLUCOSE 226*   Lab Results  Component Value Date   TROPONINI <0.03 03/12/2018   No results found for: CHOL No results found for: HDL No results found for: LDLCALC No results found for: TRIG No results found for: CHOLHDL No results found for: LDLDIRECT    Radiology: DG Chest 2 View  Result Date: 02/02/2020 CLINICAL DATA:  Increased weakness and altered mental status over several days EXAM: CHEST - 2 VIEW COMPARISON:  08/23/2019 FINDINGS: Frontal and lateral views of the chest demonstrate an unremarkable cardiac silhouette. Chronic central vascular congestion without airspace disease, effusion, or pneumothorax. No acute bony abnormalities. IMPRESSION: 1. No acute intrathoracic process. Electronically Signed   By: Sharlet Salina M.D.   On: 02/02/2020 20:36   CT Head Wo Contrast  Result Date: 02/03/2020 CLINICAL DATA:  Altered mental status. EXAM: CT HEAD WITHOUT CONTRAST TECHNIQUE: Contiguous axial images were obtained from the base of the skull through the vertex without intravenous contrast. COMPARISON:  August 23, 2019 FINDINGS: Brain: There is mild cerebral atrophy with widening of the extra-axial spaces and ventricular dilatation. There are areas of decreased attenuation within the white matter tracts of the supratentorial brain, consistent with microvascular disease changes. Vascular: No hyperdense vessel or unexpected calcification. Skull: Normal. Negative for fracture or focal lesion. Sinuses/Orbits: No acute finding. Other: None. IMPRESSION: 1. Generalized cerebral atrophy. 2. No acute intracranial abnormality. Electronically Signed   By: Aram Candela M.D.   On: 02/03/2020 00:03    EKG: Atrial fibrillation at a rate of 87 bpm  ASSESSMENT AND PLAN:   1.  Elevated troponin, ( 78,58,850 ) in the absence of chest pain or ischemic ECG changes, in the setting of acute encephalopathy secondary to left leg cellulitis, likely demand supply ischemia 2.   Acute encephalopathy 3.  Left lower extremity cellulitis 4.  Chronic atrial fibrillation, rate controlled, on Eliquis for stroke prevention 5.  Chronic diastolic congestive heart failure, appears euvolemic  Recommendations  1.  Agree with overall current therapy 2.  Repeat high-sensitivity troponin x2 3.  Defer full dose anticoagulation at this time 4.  Continue Eliquis for stroke prevention 5.  2D echocardiogram 6.  Further recommendations pending 2D echocardiogram results  Signed: Marcina Millard MD,PhD, [facility] 02/03/2020, 8:54 AM

## 2020-02-03 NOTE — Progress Notes (Signed)
PROGRESS NOTE    Bethany Joseph  SJG:[id number] DOB: 03/10/1945 DOA: 02/02/2020 PCP: Yisroel Ramming, MD   Chief complaint.  Left leg swelling and a fever Brief Narrative:  Bethany Joseph is a 74 y.o. female with medical history significant for hypothyroidism, atrial fibrillation on Eliquis, COPD, type 2 diabetes mellitus, chronic diastolic CHF, depression, insomnia, and hypertension, now presenting to the emergency department with pain and erythema involving the left lower extremity and 2 to 3 days of lethargy and confusion. Patient is diagnosed with left lower extremities cellulitis, will start on antibiotics with Rocephin. Patient also had elevated troponin, cardiology consult is obtained   Assessment & Plan:   Principal Problem:   Acute encephalopathy Active Problems:   Essential hypertension   Type 2 diabetes mellitus (HCC)   COPD (chronic obstructive pulmonary disease) (HCC)   Acquired hypothyroidism   Persistent atrial fibrillation (HCC)   Left leg cellulitis   Elevated troponin   Chronic diastolic CHF (congestive heart failure) (HCC)   Mild renal insufficiency   Depression  #1.  Sepsis secondary to cellulitis.  POA. Left lower extremity cellulitis. Acute metabolic encephalopathy. Patient had high fever, tachycardia, tachypnea, lactic acidosis associated with cellulitis.  Meet sepsis criteria at time of admission. Patient had a persistent high fever.  We will continue antibiotic Rocephin, also covered for MRSA with vancomycin. We will follow up on the blood culture results.  2.  Persistent atrial fibrillation. Elevated troponin. Chronic diastolic congestive heart failure. Cardiology has seen the patient.  Elevated troponin was due to sepsis and demand ischemia.  No additional work-up is needed. Patient does not have volume overloaded at this time. Continue Eliquis.  3.  Type 2 diabetes. Elevated glucose due to stress.  Continue sliding scale insulin for  now.  #4.  COPD. Continue home medicines     DVT prophylaxis: Eliquis Code Status: Full Family Communication: Husband updated at bedside. Disposition Plan:     Status is: Inpatient  Remains inpatient appropriate because:Inpatient level of care appropriate due to severity of illness   Dispo: The patient is from: Home              Anticipated d/c is to: Home              Anticipated d/c date is: 2 days              Patient currently is not medically stable to d/c.        I/O last 3 completed shifts: In: 1100 [IV Piggyback:1100] Out: -  No intake/output data recorded.     Consultants:   None  Procedures: None  Antimicrobials:  Rocephin and vancomycin  Subjective: Patient is confused today, she is on 2 L oxygen for comfort.  Any short of breath or cough. She still has some pain in the left lower extremity. Still have a high fever and some chills. No abdominal pain or nausea vomiting.  No diarrhea. No dysuria hematuria   Objective: Vitals:   02/03/20 0604 02/03/20 0746 02/03/20 0822 02/03/20 0854  BP: (!) 142/61 108/62 137/71 (!) 155/91  Pulse: (!) 118 (!) 108 100 (!) 102  Resp: (!) 31 20 (!) 34 (!) 32  Temp: 100 F (37.8 C)  (!) 103.1 F (39.5 C) (!) 103.2 F (39.6 C)  TempSrc: Oral  Oral Oral  SpO2: 95% 96% 95% 96%  Weight:      Height:        Intake/Output Summary (Last 24 hours) at 02/03/2020 0925 Last  data filed at 02/03/2020 0452 Gross per 24 hour  Intake 1100 ml  Output --  Net 1100 ml   Filed Weights   02/02/20 1938  Weight: 79.4 kg    Examination:  General exam: Appears calm and comfortable  Respiratory system: Clear to auscultation. Respiratory effort normal. Cardiovascular system: Irregular. No JVD, murmurs, rubs, gallops or clicks. No pedal edema. Gastrointestinal system: Abdomen is nondistended, soft and nontender. No organomegaly or masses felt. Normal bowel sounds heard. Central nervous system: Drowsy and oriented x2.  No focal neurological deficits. Extremities: Left lower extremity red, tender to touch.  Some petechia. Skin: No rashes, lesions or ulcers Psychiatry: . Mood & affect appropriate.     Data Reviewed: I have personally reviewed following labs and imaging studies  CBC: Recent Labs  Lab 02/02/20 1948  WBC 11.4*  HGB 11.1*  HCT 34.7*  MCV 85.9  PLT 225   Basic Metabolic Panel: Recent Labs  Lab 02/02/20 1948  NA 139  K 3.6  CL 99  CO2 26  GLUCOSE 226*  BUN 34*  CREATININE 1.05*  CALCIUM 9.1   GFR: Estimated Creatinine Clearance: 50.2 mL/min (A) (by C-G formula based on SCr of 1.05 mg/dL (H)). Liver Function Tests: Recent Labs  Lab 02/02/20 1948  AST 31  ALT 18  ALKPHOS 126  BILITOT 0.9  PROT 9.2*  ALBUMIN 3.9   No results for input(s): LIPASE, AMYLASE in the last 168 hours. Recent Labs  Lab 02/02/20 2326  AMMONIA 11   Coagulation Profile: Recent Labs  Lab 02/02/20 1948  INR 1.3*   Cardiac Enzymes: No results for input(s): CKTOTAL, CKMB, CKMBINDEX, TROPONINI in the last 168 hours. BNP (last 3 results) No results for input(s): PROBNP in the last 8760 hours. HbA1C: Recent Labs    02/02/20 1948  HGBA1C 7.8*   CBG: Recent Labs  Lab 02/02/20 2000 02/02/20 2345 02/03/20 0416 02/03/20 0912  GLUCAP 204* 200* 203* 158*   Lipid Profile: No results for input(s): CHOL, HDL, LDLCALC, TRIG, CHOLHDL, LDLDIRECT in the last 72 hours. Thyroid Function Tests: Recent Labs    02/02/20 1948 02/02/20 2328  TSH <0.010*  --   FREET4  --  <0.25*   Anemia Panel: No results for input(s): VITAMINB12, FOLATE, FERRITIN, TIBC, IRON, RETICCTPCT in the last 72 hours. Sepsis Labs: Recent Labs  Lab 02/02/20 1948 02/02/20 2325 02/03/20 0203  PROCALCITON  --   --  0.19  LATICACIDVEN 2.2* 1.9  --     Recent Results (from the past 240 hour(s))  Resp Panel by RT-PCR (Flu A&B, Covid) Nasopharyngeal Swab     Status: None   Collection Time: 02/02/20 11:24 PM    Specimen: Nasopharyngeal Swab; Nasopharyngeal(NP) swabs in vial transport medium  Result Value Ref Range Status   SARS Coronavirus 2 by RT PCR NEGATIVE NEGATIVE Final    Comment: (NOTE) SARS-CoV-2 target nucleic acids are NOT DETECTED.  The SARS-CoV-2 RNA is generally detectable in upper respiratory specimens during the acute phase of infection. The lowest concentration of SARS-CoV-2 viral copies this assay can detect is 138 copies/mL. A negative result does not preclude SARS-Cov-2 infection and should not be used as the sole basis for treatment or other patient management decisions. A negative result may occur with  improper specimen collection/handling, submission of specimen other than nasopharyngeal swab, presence of viral mutation(s) within the areas targeted by this assay, and inadequate number of viral copies(<138 copies/mL). A negative result must be combined with clinical observations, patient history, and  epidemiological information. The expected result is Negative.  Fact Sheet for Patients:  BloggerCourse.comhttps://www.fda.gov/media/152166/download  Fact Sheet for Healthcare Providers:  SeriousBroker.ithttps://www.fda.gov/media/152162/download  This test is no t yet approved or cleared by the [place] FDA and  has been authorized for detection and/or diagnosis of SARS-CoV-2 by FDA under an Emergency Use Authorization (EUA). This EUA will remain  in effect (meaning this test can be used) for the duration of the COVID-19 declaration under Section 564(b)(1) of the Act, 21 U.S.C.section 360bbb-3(b)(1), unless the authorization is terminated  or revoked sooner.       Influenza A by PCR NEGATIVE NEGATIVE Final   Influenza B by PCR NEGATIVE NEGATIVE Final    Comment: (NOTE) The Xpert Xpress SARS-CoV-2/FLU/RSV plus assay is intended as an aid in the diagnosis of influenza from Nasopharyngeal swab specimens and should not be used as a sole basis for treatment. Nasal washings and aspirates are  unacceptable for Xpert Xpress SARS-CoV-2/FLU/RSV testing.  Fact Sheet for Patients: BloggerCourse.comhttps://www.fda.gov/media/152166/download  Fact Sheet for Healthcare Providers: SeriousBroker.ithttps://www.fda.gov/media/152162/download  This test is not yet approved or cleared by the [place] FDA and has been authorized for detection and/or diagnosis of SARS-CoV-2 by FDA under an Emergency Use Authorization (EUA). This EUA will remain in effect (meaning this test can be used) for the duration of the COVID-19 declaration under Section 564(b)(1) of the Act, 21 U.S.C. section 360bbb-3(b)(1), unless the authorization is terminated or revoked.  Performed at [facility], [address] [place], [place] [address]   Blood culture (routine single)     Status: None (Preliminary result)   Collection Time: 02/02/20 11:28 PM   Specimen: BLOOD  Result Value Ref Range Status   Specimen Description BLOOD LEFT ASSIST CONTROL  Final   Special Requests   Final    BOTTLES DRAWN AEROBIC AND ANAEROBIC Blood Culture adequate volume   Culture   Final    NO GROWTH < 12 HOURS Performed at [facility], [address] [place], [place] [address]    Report Status PENDING  Incomplete         Radiology Studies: DG Chest 2 View  Result Date: 02/02/2020 CLINICAL DATA:  Increased weakness and altered mental status over several days EXAM: CHEST - 2 VIEW COMPARISON:  08/23/2019 FINDINGS: Frontal and lateral views of the chest demonstrate an unremarkable cardiac silhouette. Chronic central vascular congestion without airspace disease, effusion, or pneumothorax. No acute bony abnormalities. IMPRESSION: 1. No acute intrathoracic process. Electronically Signed   By: Sharlet SalinaMichael  Brown M.D.   On: 02/02/2020 20:36   CT Head Wo Contrast  Result Date: 02/03/2020 CLINICAL DATA:  Altered mental status. EXAM: CT HEAD WITHOUT CONTRAST TECHNIQUE: Contiguous axial images were obtained from the base of the skull through the  vertex without intravenous contrast. COMPARISON:  August 23, 2019 FINDINGS: Brain: There is mild cerebral atrophy with widening of the extra-axial spaces and ventricular dilatation. There are areas of decreased attenuation within the white matter tracts of the supratentorial brain, consistent with microvascular disease changes. Vascular: No hyperdense vessel or unexpected calcification. Skull: Normal. Negative for fracture or focal lesion. Sinuses/Orbits: No acute finding. Other: None. IMPRESSION: 1. Generalized cerebral atrophy. 2. No acute intracranial abnormality. Electronically Signed   By: Aram Candelahaddeus  Houston M.D.   On: 02/03/2020 00:03        Scheduled Meds:  apixaban  5 mg Oral Q12H   insulin aspart  0-5 Units Subcutaneous QHS   insulin aspart  0-9 Units Subcutaneous TID WC  levothyroxine  25 mcg Oral QAC breakfast   liothyronine  100 mcg Oral Daily   losartan  50 mg Oral Daily   pantoprazole  40 mg Oral Daily   pravastatin  20 mg Oral q1800   Continuous Infusions:  sodium chloride     cefTRIAXone (ROCEPHIN)  IV     lactated ringers       LOS: 0 days    Time spent:     Marrion Coy, MD [facility]   To contact the attending provider between 7A-7P or the covering provider during after hours 7P-7A, please log into the web site [email or web address] and access using universal Walker password for that web site. If you do not have the password, please call the hospital operator.  02/03/2020, 9:25 AM

## 2020-02-03 NOTE — Progress Notes (Signed)
PHARMACY - PHYSICIAN COMMUNICATION CRITICAL VALUE ALERT - BLOOD CULTURE IDENTIFICATION (BCID)  Bethany Joseph is an 74 y.o. female who presented to [facility] on 02/02/2020 with a chief complaint of LLE cellulitis  Assessment:  1/2 Streptococcus sp (negative for S. pneumoniae, S. agalactiae, and S. pyogenes)  Name of physician (or Provider) Contacted: Zhang  Current antibiotics: ceftriaxone and vancomycin  Changes to prescribed antibiotics recommended:   Patient is on recommended antibiotics - adjust ceftriaxone dose to 2 grams every 24 hours  Results for orders placed or performed during the hospital encounter of 02/02/20  Blood Culture ID Panel (Reflexed) (Collected: 02/02/2020 11:28 PM)  Result Value Ref Range   Enterococcus faecalis NOT DETECTED NOT DETECTED   Enterococcus Faecium NOT DETECTED NOT DETECTED   Listeria monocytogenes NOT DETECTED NOT DETECTED   Staphylococcus species NOT DETECTED NOT DETECTED   Staphylococcus aureus (BCID) NOT DETECTED NOT DETECTED   Staphylococcus epidermidis NOT DETECTED NOT DETECTED   Staphylococcus lugdunensis NOT DETECTED NOT DETECTED   Streptococcus species DETECTED (A) NOT DETECTED   Streptococcus agalactiae NOT DETECTED NOT DETECTED   Streptococcus pneumoniae NOT DETECTED NOT DETECTED   Streptococcus pyogenes NOT DETECTED NOT DETECTED   A.calcoaceticus-baumannii NOT DETECTED NOT DETECTED   Bacteroides fragilis NOT DETECTED NOT DETECTED   Enterobacterales NOT DETECTED NOT DETECTED   Enterobacter cloacae complex NOT DETECTED NOT DETECTED   Escherichia coli NOT DETECTED NOT DETECTED   Klebsiella aerogenes NOT DETECTED NOT DETECTED   Klebsiella oxytoca NOT DETECTED NOT DETECTED   Klebsiella pneumoniae NOT DETECTED NOT DETECTED   Proteus species NOT DETECTED NOT DETECTED   Salmonella species NOT DETECTED NOT DETECTED   Serratia marcescens NOT DETECTED NOT DETECTED   Haemophilus influenzae NOT DETECTED NOT DETECTED   Neisseria  meningitidis NOT DETECTED NOT DETECTED   Pseudomonas aeruginosa NOT DETECTED NOT DETECTED   Stenotrophomonas maltophilia NOT DETECTED NOT DETECTED   Candida albicans NOT DETECTED NOT DETECTED   Candida auris NOT DETECTED NOT DETECTED   Candida glabrata NOT DETECTED NOT DETECTED   Candida krusei NOT DETECTED NOT DETECTED   Candida parapsilosis NOT DETECTED NOT DETECTED   Candida tropicalis NOT DETECTED NOT DETECTED   Cryptococcus neoformans/gattii NOT DETECTED NOT DETECTED    Bethany Joseph 02/03/2020  1:13 PM

## 2020-02-04 DIAGNOSIS — G9340 Encephalopathy, unspecified: Secondary | ICD-10-CM

## 2020-02-04 DIAGNOSIS — I4819 Other persistent atrial fibrillation: Secondary | ICD-10-CM

## 2020-02-04 DIAGNOSIS — L03116 Cellulitis of left lower limb: Secondary | ICD-10-CM

## 2020-02-04 LAB — ECHOCARDIOGRAM COMPLETE
Height: 67 in
S' Lateral: 3.45 cm
Weight: 2800.72 oz

## 2020-02-04 LAB — RPR: RPR Ser Ql: NONREACTIVE

## 2020-02-04 LAB — CBC
HCT: 29.5 % — ABNORMAL LOW (ref 36.0–46.0)
Hemoglobin: 9.6 g/dL — ABNORMAL LOW (ref 12.0–15.0)
MCH: 27.7 pg (ref 26.0–34.0)
MCHC: 32.5 g/dL (ref 30.0–36.0)
MCV: 85.3 fL (ref 80.0–100.0)
Platelets: 167 10*3/uL (ref 150–400)
RBC: 3.46 MIL/uL — ABNORMAL LOW (ref 3.87–5.11)
RDW: 16.2 % — ABNORMAL HIGH (ref 11.5–15.5)
WBC: 11.5 10*3/uL — ABNORMAL HIGH (ref 4.0–10.5)
nRBC: 0.2 % (ref 0.0–0.2)

## 2020-02-04 LAB — BASIC METABOLIC PANEL
Anion gap: 14 (ref 5–15)
BUN: 34 mg/dL — ABNORMAL HIGH (ref 8–23)
CO2: 22 mmol/L (ref 22–32)
Calcium: 8.9 mg/dL (ref 8.9–10.3)
Chloride: 102 mmol/L (ref 98–111)
Creatinine, Ser: 0.86 mg/dL (ref 0.44–1.00)
GFR, Estimated: >60 mL/min (ref >60)
Glucose, Bld: 180 mg/dL — ABNORMAL HIGH (ref 70–99)
Potassium: 2.9 mmol/L — ABNORMAL LOW (ref 3.5–5.1)
Sodium: 138 mmol/L (ref 135–145)

## 2020-02-04 LAB — MAGNESIUM: Magnesium: 2 mg/dL (ref 1.7–2.4)

## 2020-02-04 LAB — GLUCOSE, CAPILLARY
Glucose-Capillary: 154 mg/dL — ABNORMAL HIGH (ref 70–99)
Glucose-Capillary: 187 mg/dL — ABNORMAL HIGH (ref 70–99)
Glucose-Capillary: 134 mg/dL — ABNORMAL HIGH (ref 70–99)
Glucose-Capillary: 149 mg/dL — ABNORMAL HIGH (ref 70–99)

## 2020-02-04 LAB — T3, FREE: T3, Free: 0.6 pg/mL — ABNORMAL LOW (ref 2.0–4.4)

## 2020-02-04 MED ORDER — VANCOMYCIN HCL 1500 MG/300ML IV SOLN
1500.0000 mg | INTRAVENOUS | Status: DC
  Administered 2020-02-05: 06:00:00 1500 mg via INTRAVENOUS
  Filled 2020-02-04: qty 300

## 2020-02-04 MED ORDER — POTASSIUM CHLORIDE 10 MEQ/100ML IV SOLN
10.0000 meq | INTRAVENOUS | Status: AC
  Administered 2020-02-04 (×2): 10 meq via INTRAVENOUS
  Filled 2020-02-04 (×2): qty 100

## 2020-02-04 MED ORDER — HYDROCODONE-ACETAMINOPHEN 5-325 MG PO TABS
1.0000 | ORAL_TABLET | Freq: Four times a day (QID) | ORAL | Status: DC | PRN
  Administered 2020-02-04 – 2020-02-07 (×10): 1 via ORAL
  Filled 2020-02-04 (×10): qty 1

## 2020-02-04 NOTE — Progress Notes (Signed)
Mobility Specialist - Progress Note   02/04/20 1500  Mobility  Range of Motion/Exercises Right leg;Left leg (AP, SLR, ABD, ISO)  Level of Assistance Standby assist, set-up cues, supervision of patient - no hands on  Assistive Device None  Distance Ambulated (ft) 0 ft  Mobility Response Tolerated well  Mobility performed by Mobility specialist  $Mobility charge 1 Mobility    Pre-mobility: 87 HR, 97% SpO2 During mobility: 107 HR Post-mobility: 98 HR, 97% SpO2   Pt was lying in bed upon arrival with family present, utilizing 3L [place] O2. Pt agreed to session. Pt c/o pain "everywhere" rating it a 8/10. Pt needed some encouragement for participation, but was able to perform supine exercises: ankle pumps x20, straight leg raises x10, hip abduction x10, and hip isometrics x10 with SBA. Pt was left in bed with all needs in reach. Nurse notified.   Bethany Joseph Mobility Specialist 02/04/20, 3:09 PM

## 2020-02-04 NOTE — Consult Note (Signed)
WOC Nurse Consult Note: Reason for Consult: area under right great toe Wound type:calleous; healed area Pressure Injury POA: NA Measurement: NA Wound bed: NA Drainage (amount, consistency, odor) none Periwound:intact  Dressing procedure/placement/frequency: No open wound, evidence of healed area with callous.  Patient has been followed by Dr. Ether Griffins in the past; recommend follow up with this provider on a regular basis due to the history of prior ulceration and DM   Bethany Joseph [facility], CNS, CWON-AP [phone]

## 2020-02-04 NOTE — Progress Notes (Signed)
Contacted NP Ouma regarding patient's elevated RR and irregular heart rate. Patient has a history of CHF and had LR going at 75 mL/hr. She also had some expiratory wheezing. I was instructed to stop maintenance fluids. Patient is fairing well. I gave her some metoprolol for the Afib and her respirations are better since I stopped the fluids. She has albuterol as a prn for wheezing which I gave. Will continue to monitor.  Bethany Joseph

## 2020-02-04 NOTE — Progress Notes (Signed)
Pharmacy Antibiotic Note  Bethany Joseph is a 74 y.o. female with PMH of hypothyroidism, atrial fibrillation on Eliquis, COPD, type 2 diabetes mellitus, chronic diastolic CHF, depression, insomnia, and hypertension admitted on 02/02/2020 with sepsis due to LLE cellulitis. She has a recent history of Klebsiella bacteremia in July 2021. Pharmacy has been consulted for vancomycin dosing. Her renal function is now at what appears to be her baseline level.  Plan:  adjust vancomycin dose to 1500 mg IV every 24 hours  Ke: 0.050 h-1, T1/2: 13.9 h  Css (calculated): 35.2 / 11.4 mcg/mL  Daily SCr while on IV vancomycin to assess renal function   Levels as clinically indicated  Height: 5\' 7"  (170.2 cm) Weight: 84.7 kg (186 lb 11.7 oz) IBW/kg (Calculated) : 61.6  Temp (24hrs), Avg:99.4 F (37.4 C), Min:97.8 F (36.6 C), Max:103.2 F (39.6 C)  Recent Labs  Lab 02/02/20 1948 02/02/20 2325 02/04/20 0401  WBC 11.4*  --  11.5*  CREATININE 1.05*  --  0.86  LATICACIDVEN 2.2* 1.9  --     Estimated Creatinine Clearance: 63.2 mL/min (by C-G formula based on SCr of 0.86 mg/dL).    Allergies  Allergen Reactions  . Neosporin [Neomycin-Polymyxin-Gramicidin] Hives  . Tape     Other reaction(s): Unknown Adhesive bandage  . Codeine Rash  . Latex Rash  . Neomycin-Bacitracin Zn-Polymyx Rash    Other reaction(s): Unknown DERMATOLOGICALS   . Penicillins Rash and Other (See Comments)    Has patient had a PCN reaction causing immediate rash, facial/tongue/throat swelling, SOB or lightheadedness with hypotension: Unknown Has patient had a PCN reaction causing severe rash involving mucus membranes or skin necrosis: Unknown Has patient had a PCN reaction that required hospitalization: Unknown Has patient had a PCN reaction occurring within the last 10 years: No If all of the above answers are "NO", then may proceed with Cephalosporin use.     Antimicrobials this admission: ceftriaxone 12/22 >>   vancomycin 12/22 >>   Microbiology results: 12/21 BCx: 2/2 Streptococcus sp (negative for S. pneumoniae, S. agalactiae, and S. pyogenes) 12/21 UCx: re-collect 12/21 SARS CoV-2: negative 12/21 influenza A/B: negative   Thank you for allowing pharmacy to be a part of this patient's care.  1/22 02/04/2020 7:46 AM

## 2020-02-04 NOTE — Progress Notes (Signed)
PROGRESS NOTE    Bethany SpinnerKarla Joseph  UJW:[id number] DOB: 01/20/1946 DOA: 02/02/2020 PCP: [name], Whitman L, MD   Chief complaint.  Left leg swelling. Brief Narrative:  Bethany BickersKarla Drozdowskiis a 74 y.o.femalewith medical history significant forhypothyroidism, atrial fibrillation on Eliquis, COPD, type 2 diabetes mellitus, chronic diastolic CHF, depression, insomnia, and hypertension, now presenting to the emergency department with pain and erythema involving the left lower extremity and 2 to 3 days of lethargy and confusion. Patient is diagnosed with left lower extremities cellulitis, will start on antibiotics with Rocephin. Patient also had elevated troponin, cardiology consult is obtained    Assessment & Plan:   Principal Problem:   Acute encephalopathy Active Problems:   Essential hypertension   Type 2 diabetes mellitus (HCC)   COPD (chronic obstructive pulmonary disease) (HCC)   Acquired hypothyroidism   Persistent atrial fibrillation (HCC)   Left leg cellulitis   Elevated troponin   Chronic diastolic CHF (congestive heart failure) (HCC)   Mild renal insufficiency   Depression  #1.  Sepsis secondary to cellulitis. Gram-positive cocci septicemia. Left lower extremity cellulitis. Acute metabolic encephalopathy. Patient leg edema is better.  Blood culture came back positive for gram-positive cocci, pending final results. We will continue vancomycin and Rocephin for now. Patient mental status has been improving.  Fever came down. Leg looks better today. Will decide if a PICC line is needed for IV antibiotics tomorrow after final culture results available.  #2.  Persistent atrial fibrillation. Elevated troponin. Chronic combined systolic and diastolic congestive heart failure. Reviewed echocardiogram, ejection fraction 45 to 50%.  Patient has been followed by cardiology. Continue Eliquis for stroke prevention. Patient does not have any evidence of volume overload.  3.   Type 2 diabetes. Continue current regimen.  4.  COPD. Stable.    DVT prophylaxis: Eliquis Code Status: Full Family Communication: Husband updated Disposition Plan:  .   Status is: Inpatient  Remains inpatient appropriate because:Inpatient level of care appropriate due to severity of illness   Dispo: The patient is from: Home              Anticipated d/c is to: Home              Anticipated d/c date is: 2 days              Patient currently is not medically stable to d/c.        I/O last 3 completed shifts: In: 2189.7 [P.O.:480; I.V.:209.7; IV Piggyback:1500] Out: 1200 [Urine:1200] Total I/O In: 240 [P.O.:240] Out: -      Consultants:   None  Procedures: None  Antimicrobials:  Rocephin and vancomycin  Subjective: Patient doing better today, although no additional fever.  Still has pain in the left leg. No nausea vomiting or abdominal pain. No chest pain shortness of breath or cough. No dysuria hematuria.  Objective: Vitals:   02/03/20 2310 02/04/20 0416 02/04/20 0737 02/04/20 1126  BP: (!) 166/92 (!) 145/85 (!) 143/107 125/77  Pulse: (!) 111 97 96 93  Resp: (!) 24 (!) 24 (!) 27 20  Temp: 98.8 F (37.1 C) 97.8 F (36.6 C) 99.3 F (37.4 C) 98.1 F (36.7 C)  TempSrc: Oral Oral Oral Oral  SpO2: 96% 96% 100% 97%  Weight:  84.7 kg    Height:        Intake/Output Summary (Last 24 hours) at 02/04/2020 1249 Last data filed at 02/04/2020 0900 Gross per 24 hour  Intake 1287.75 ml  Output 1200 ml  Net 87.75  ml   Filed Weights   02/02/20 1938 02/04/20 0416  Weight: 79.4 kg 84.7 kg    Examination:  General exam: Appears calm and comfortable  Respiratory system: Clear to auscultation. Respiratory effort normal. Cardiovascular system: Irregular, no JVD, murmurs, rubs, gallops or clicks. No pedal edema. Gastrointestinal system: Abdomen is nondistended, soft and nontender. No organomegaly or masses felt. Normal bowel sounds heard. Central nervous  system: Alert and oriented x3. No focal neurological deficits. Extremities: Left leg swelling, redness much better.  No tenderness today Skin: No rashes, lesions or ulcers Psychiatry:  Mood & affect appropriate.     Data Reviewed: I have personally reviewed following labs and imaging studies  CBC: Recent Labs  Lab 02/02/20 1948 02/04/20 0401  WBC 11.4* 11.5*  HGB 11.1* 9.6*  HCT 34.7* 29.5*  MCV 85.9 85.3  PLT 225 167   Basic Metabolic Panel: Recent Labs  Lab 02/02/20 1948 02/04/20 0401  NA 139 138  K 3.6 2.9*  CL 99 102  CO2 26 22  GLUCOSE 226* 180*  BUN 34* 34*  CREATININE 1.05* 0.86  CALCIUM 9.1 8.9  MG  --  2.0   GFR: Estimated Creatinine Clearance: 63.2 mL/min (by C-G formula based on SCr of 0.86 mg/dL). Liver Function Tests: Recent Labs  Lab 02/02/20 1948  AST 31  ALT 18  ALKPHOS 126  BILITOT 0.9  PROT 9.2*  ALBUMIN 3.9   No results for input(s): LIPASE, AMYLASE in the last 168 hours. Recent Labs  Lab 02/02/20 2326  AMMONIA 11   Coagulation Profile: Recent Labs  Lab 02/02/20 1948  INR 1.3*   Cardiac Enzymes: No results for input(s): CKTOTAL, CKMB, CKMBINDEX, TROPONINI in the last 168 hours. BNP (last 3 results) No results for input(s): PROBNP in the last 8760 hours. HbA1C: Recent Labs    02/02/20 1948  HGBA1C 7.8*   CBG: Recent Labs  Lab 02/03/20 1206 02/03/20 1557 02/03/20 2016 02/04/20 0739 02/04/20 1123  GLUCAP 160* 176* 203* 154* 187*   Lipid Profile: No results for input(s): CHOL, HDL, LDLCALC, TRIG, CHOLHDL, LDLDIRECT in the last 72 hours. Thyroid Function Tests: Recent Labs    02/02/20 1948 02/02/20 2328 02/03/20 0608  TSH <0.010*  --   --   FREET4  --  <0.25*  --   T3FREE  --   --  0.6*   Anemia Panel: Recent Labs    02/03/20 0608  VITAMINB12 179*   Sepsis Labs: Recent Labs  Lab 02/02/20 1948 02/02/20 2325 02/03/20 0203  PROCALCITON  --   --  0.19  LATICACIDVEN 2.2* 1.9  --     Recent Results  (from the past 240 hour(s))  Urine culture     Status: Abnormal   Collection Time: 02/02/20  1:06 PM   Specimen: Urine, Random  Result Value Ref Range Status   Specimen Description   Final    URINE, RANDOM Performed at [facility], [address], [place], [place] [address]    Special Requests   Final    NONE Performed at [facility], [address] [place], [place] [address]    Culture MULTIPLE SPECIES PRESENT, SUGGEST RECOLLECTION (A)  Final   Report Status 02/03/2020 FINAL  Final  Resp Panel by RT-PCR (Flu A&B, Covid) Nasopharyngeal Swab     Status: None   Collection Time: 02/02/20 11:24 PM   Specimen: Nasopharyngeal Swab; Nasopharyngeal(NP) swabs in vial transport medium  Result Value Ref Range Status   SARS Coronavirus 2 by  RT PCR NEGATIVE NEGATIVE Final    Comment: (NOTE) SARS-CoV-2 target nucleic acids are NOT DETECTED.  The SARS-CoV-2 RNA is generally detectable in upper respiratory specimens during the acute phase of infection. The lowest concentration of SARS-CoV-2 viral copies this assay can detect is 138 copies/mL. A negative result does not preclude SARS-Cov-2 infection and should not be used as the sole basis for treatment or other patient management decisions. A negative result may occur with  improper specimen collection/handling, submission of specimen other than nasopharyngeal swab, presence of viral mutation(s) within the areas targeted by this assay, and inadequate number of viral copies(<138 copies/mL). A negative result must be combined with clinical observations, patient history, and epidemiological information. The expected result is Negative.  Fact Sheet for Patients:  BloggerCourse.com  Fact Sheet for Healthcare Providers:  SeriousBroker.it  This test is no t yet approved or cleared by the [place] FDA and  has been authorized for detection and/or diagnosis of SARS-CoV-2  by FDA under an Emergency Use Authorization (EUA). This EUA will remain  in effect (meaning this test can be used) for the duration of the COVID-19 declaration under Section 564(b)(1) of the Act, 21 U.S.C.section 360bbb-3(b)(1), unless the authorization is terminated  or revoked sooner.       Influenza A by PCR NEGATIVE NEGATIVE Final   Influenza B by PCR NEGATIVE NEGATIVE Final    Comment: (NOTE) The Xpert Xpress SARS-CoV-2/FLU/RSV plus assay is intended as an aid in the diagnosis of influenza from Nasopharyngeal swab specimens and should not be used as a sole basis for treatment. Nasal washings and aspirates are unacceptable for Xpert Xpress SARS-CoV-2/FLU/RSV testing.  Fact Sheet for Patients: BloggerCourse.com  Fact Sheet for Healthcare Providers: SeriousBroker.it  This test is not yet approved or cleared by the [place] FDA and has been authorized for detection and/or diagnosis of SARS-CoV-2 by FDA under an Emergency Use Authorization (EUA). This EUA will remain in effect (meaning this test can be used) for the duration of the COVID-19 declaration under Section 564(b)(1) of the Act, 21 U.S.C. section 360bbb-3(b)(1), unless the authorization is terminated or revoked.  Performed at [facility], [address] [place], [place] [address]   Blood culture (routine single)     Status: None (Preliminary result)   Collection Time: 02/02/20 11:28 PM   Specimen: BLOOD  Result Value Ref Range Status   Specimen Description   Final    BLOOD LEFT ANTECUBITAL Performed at [facility], [address], [place], [place] [address]    Special Requests   Final    BOTTLES DRAWN AEROBIC AND ANAEROBIC Blood Culture adequate volume   Culture  Setup Time   Final    GRAM POSITIVE COCCI IN BOTH AEROBIC AND ANAEROBIC BOTTLES Organism ID to follow CRITICAL RESULT CALLED TO, READ BACK BY AND VERIFIED WITH: K.PATEL,PHARMD  AT 1308 ON 02/03/20 BY GM Performed at [facility], [address] [place], [place] [address]    Culture [facility] POSITIVE COCCI  Final   Report Status PENDING  Incomplete  Blood Culture ID Panel (Reflexed)     Status: Abnormal   Collection Time: 02/02/20 11:28 PM  Result Value Ref Range Status   Enterococcus faecalis NOT DETECTED NOT DETECTED Final   Enterococcus Faecium NOT DETECTED NOT DETECTED Final   Listeria monocytogenes NOT DETECTED NOT DETECTED Final   Staphylococcus species NOT DETECTED NOT DETECTED Final   Staphylococcus aureus (BCID) NOT DETECTED NOT DETECTED Final   Staphylococcus epidermidis  NOT DETECTED NOT DETECTED Final   Staphylococcus lugdunensis NOT DETECTED NOT DETECTED Final   Streptococcus species DETECTED (A) NOT DETECTED Final    Comment: Not Enterococcus species, Streptococcus agalactiae, Streptococcus pyogenes, or Streptococcus pneumoniae. CRITICAL RESULT CALLED TO, READ BACK BY AND VERIFIED WITH: K.PATEL,PHARMD AT 1308 ON 02/03/20 BY GM    Streptococcus agalactiae NOT DETECTED NOT DETECTED Final   Streptococcus pneumoniae NOT DETECTED NOT DETECTED Final   Streptococcus pyogenes NOT DETECTED NOT DETECTED Final   A.calcoaceticus-baumannii NOT DETECTED NOT DETECTED Final   Bacteroides fragilis NOT DETECTED NOT DETECTED Final   Enterobacterales NOT DETECTED NOT DETECTED Final   Enterobacter cloacae complex NOT DETECTED NOT DETECTED Final   Escherichia coli NOT DETECTED NOT DETECTED Final   Klebsiella aerogenes NOT DETECTED NOT DETECTED Final   Klebsiella oxytoca NOT DETECTED NOT DETECTED Final   Klebsiella pneumoniae NOT DETECTED NOT DETECTED Final   Proteus species NOT DETECTED NOT DETECTED Final   Salmonella species NOT DETECTED NOT DETECTED Final   Serratia marcescens NOT DETECTED NOT DETECTED Final   Haemophilus influenzae NOT DETECTED NOT DETECTED Final   Neisseria meningitidis NOT DETECTED NOT DETECTED Final   Pseudomonas aeruginosa NOT  DETECTED NOT DETECTED Final   Stenotrophomonas maltophilia NOT DETECTED NOT DETECTED Final   Candida albicans NOT DETECTED NOT DETECTED Final   Candida auris NOT DETECTED NOT DETECTED Final   Candida glabrata NOT DETECTED NOT DETECTED Final   Candida krusei NOT DETECTED NOT DETECTED Final   Candida parapsilosis NOT DETECTED NOT DETECTED Final   Candida tropicalis NOT DETECTED NOT DETECTED Final   Cryptococcus neoformans/gattii NOT DETECTED NOT DETECTED Final    Comment: Performed at [facility], [address] [place], [place] [address]         Radiology Studies: DG Chest 2 View  Result Date: 02/02/2020 CLINICAL DATA:  Increased weakness and altered mental status over several days EXAM: CHEST - 2 VIEW COMPARISON:  08/23/2019 FINDINGS: Frontal and lateral views of the chest demonstrate an unremarkable cardiac silhouette. Chronic central vascular congestion without airspace disease, effusion, or pneumothorax. No acute bony abnormalities. IMPRESSION: 1. No acute intrathoracic process. Electronically Signed   By: Sharlet Salina M.D.   On: 02/02/2020 20:36   CT Head Wo Contrast  Result Date: 02/03/2020 CLINICAL DATA:  Altered mental status. EXAM: CT HEAD WITHOUT CONTRAST TECHNIQUE: Contiguous axial images were obtained from the base of the skull through the vertex without intravenous contrast. COMPARISON:  August 23, 2019 FINDINGS: Brain: There is mild cerebral atrophy with widening of the extra-axial spaces and ventricular dilatation. There are areas of decreased attenuation within the white matter tracts of the supratentorial brain, consistent with microvascular disease changes. Vascular: No hyperdense vessel or unexpected calcification. Skull: Normal. Negative for fracture or focal lesion. Sinuses/Orbits: No acute finding. Other: None. IMPRESSION: 1. Generalized cerebral atrophy. 2. No acute intracranial abnormality. Electronically Signed   By: Aram Candela M.D.   On: 02/03/2020  00:03   ECHOCARDIOGRAM COMPLETE  Result Date: 02/04/2020    ECHOCARDIOGRAM REPORT   Patient Name:   Bethany Joseph Date of Exam: 02/03/2020 Medical Rec #:  [id number]        Height:       67.0 in Accession #:    [id number]       Weight:       175.0 lb Date of Birth:  04/23/1945         BSA:          1.911 m Patient  Age:    74 years         BP:           112/69 mmHg Patient Gender: F                HR:           95 bpm. Exam Location:  [facility] Procedure: 2D Echo, Color Doppler and Cardiac Doppler Indications:     Elevated troponin  History:         Patient has no prior history of Echocardiogram examinations.                  Arrythmias:Atrial Fibrillation, Signs/Symptoms:Murmur; Risk                  Factors:Diabetes.  Sonographer:     Cristela Blue RDCS (AE) Referring Phys:  [id number] Lyn Hollingshead PARASCHOS Diagnosing Phys: Arnoldo Hooker MD  Sonographer Comments: Technically challenging study due to limited acoustic windows, no apical window and no subcostal window. IMPRESSIONS  1. Left ventricular ejection fraction, by estimation, is 45 to 50%. The left ventricle has mildly decreased function. The left ventricle demonstrates global hypokinesis. Left ventricular diastolic parameters were normal.  2. Right ventricular systolic function is normal. The right ventricular size is normal.  3. Left atrial size was mildly dilated.  4. The mitral valve is normal in structure. Mild mitral valve regurgitation.  5. The aortic valve is normal in structure. Aortic valve regurgitation is not visualized. FINDINGS  Left Ventricle: Left ventricular ejection fraction, by estimation, is 45 to 50%. The left ventricle has mildly decreased function. The left ventricle demonstrates global hypokinesis. The left ventricular internal cavity size was normal in size. There is  no left ventricular hypertrophy. Left ventricular diastolic parameters were normal. Right Ventricle: The right ventricular size is normal. No increase in right ventricular wall  thickness. Right ventricular systolic function is normal. Left Atrium: Left atrial size was mildly dilated. Right Atrium: Right atrial size was normal in size. Pericardium: There is no evidence of pericardial effusion. Mitral Valve: The mitral valve is normal in structure. Mild mitral valve regurgitation. Tricuspid Valve: The tricuspid valve is normal in structure. Tricuspid valve regurgitation is mild. Aortic Valve: The aortic valve is normal in structure. Aortic valve regurgitation is not visualized. Pulmonic Valve: The pulmonic valve was normal in structure. Pulmonic valve regurgitation is not visualized. Aorta: The aortic root and ascending aorta are structurally normal, with no evidence of dilitation. IAS/Shunts: No atrial level shunt detected by color flow Doppler.  LEFT VENTRICLE PLAX 2D LVIDd:         5.06 cm LVIDs:         3.45 cm LV PW:         1.05 cm LV IVS:        1.00 cm LVOT diam:     2.00 cm LVOT Area:     3.14 cm  LEFT ATRIUM         Index LA diam:    5.00 cm 2.62 cm/m   AORTA Ao Root diam: 3.20 cm TRICUSPID VALVE TR Peak grad:   37.7 mmHg TR Vmax:        307.00 cm/s  SHUNTS Systemic Diam: 2.00 cm Arnoldo Hooker MD Electronically signed by Arnoldo Hooker MD Signature Date/Time: 02/04/2020/9:41:08 AM    Final         Scheduled Meds: . apixaban  5 mg Oral Q12H  . insulin aspart  0-5 Units Subcutaneous QHS  .  insulin aspart  0-9 Units Subcutaneous TID WC  . levothyroxine  25 mcg Oral QAC breakfast  . liothyronine  75 mcg Oral Daily  . losartan  50 mg Oral Daily  . mouth rinse  15 mL Mouth Rinse BID  . pantoprazole  40 mg Oral Daily  . pravastatin  20 mg Oral q1800   Continuous Infusions: . sodium chloride    . cefTRIAXone (ROCEPHIN)  IV Stopped (02/03/20 1751)  . [START ON 02/05/2020] vancomycin       LOS: 1 day    Time spent: 28 minutes    Marrion Coy, MD [facility]   To contact the attending provider between 7A-7P or the covering provider during after  hours 7P-7A, please log into the web site [email or web address] and access using universal [place] password for that web site. If you do not have the password, please call the hospital operator.  02/04/2020, 12:49 PM

## 2020-02-04 NOTE — Progress Notes (Signed)
[facility] Cardiology [facility] Encounter Note  Patient: Bethany Joseph / Admit Date: 02/02/2020 / Date of Encounter: 02/04/2020, 11:10 AM   Subjective: 12/23.  Patient feels much better since admission with significant improvement in clearing of her sensorium.  The patient's infection of left lower extremity appears to be improved with slightly less edema and erythema.  The patient appears to be tolerating her antibiotics well.  Atrial fibrillation appears well controlled heart rate with no evidence of significant concerns.  Patient remains on Eliquis for further risk reduction in stroke with no evidence of bleeding complications There has been no evidence of chest discomfort or EKG changes or acute coronary syndrome despite elevation of troponin most consistent with myocardial injury from atrial fibrillation with rapid ventricular rate and infection  Echocardiogram showing mild global LV systolic dysfunction with ejection fraction of 45% most consistent with chronic nonvalvular atrial fibrillation rather than congestive heart failure.  There is no evidence of significant valvular heart disease  Review of Systems: Positive for: Leg pain shortness of breath Negative for: Vision change, hearing change, syncope, dizziness, nausea, vomiting,diarrhea, bloody stool, stomach pain, cough, congestion, diaphoresis, urinary frequency, urinary pain,skin lesions, skin rashes Others previously listed  Objective: Telemetry: Atrial fibrillation with controlled ventricular rate Physical Exam: Blood pressure (!) 143/107, pulse 96, temperature 99.3 F (37.4 C), temperature source Oral, resp. rate (!) 27, height 5\' 7"  (1.702 m), weight 84.7 kg, SpO2 100 %. Body mass index is 29.25 kg/m. General: Well developed, well nourished, in no acute distress. Head: Normocephalic, atraumatic, sclera non-icteric, no xanthomas, nares are without discharge. Neck: No apparent masses Lungs: Normal respirations with no  wheezes, no rhonchi, no rales , few crackles   Heart: He rate rate and rhythm, normal S1 S2, no murmur, no rub, no gallop, PMI is normal size and placement, carotid upstroke normal without bruit, jugular venous pressure normal Abdomen: Soft, non-tender, non-distended with normoactive bowel sounds. No hepatosplenomegaly. Abdominal aorta is normal size without bruit Extremities: Trace to 1+ edema, no clubbing, no cyanosis, no ulcers, with erythema and cellulitis of left leg Peripheral: 2+ radial, 2+ femoral, 2+ dorsal pedal pulses Neuro: Alert and oriented. Moves all extremities spontaneously. Psych:  Responds to questions appropriately with a normal affect.   Intake/Output Summary (Last 24 hours) at 02/04/2020 1110 Last data filed at 02/04/2020 0900 Gross per 24 hour  Intake 1287.75 ml  Output 1200 ml  Net 87.75 ml    Inpatient Medications:  . apixaban  5 mg Oral Q12H  . insulin aspart  0-5 Units Subcutaneous QHS  . insulin aspart  0-9 Units Subcutaneous TID WC  . levothyroxine  25 mcg Oral QAC breakfast  . liothyronine  75 mcg Oral Daily  . losartan  50 mg Oral Daily  . mouth rinse  15 mL Mouth Rinse BID  . pantoprazole  40 mg Oral Daily  . pravastatin  20 mg Oral q1800   Infusions:  . sodium chloride    . cefTRIAXone (ROCEPHIN)  IV Stopped (02/03/20 1751)  . potassium chloride 10 mEq (02/04/20 1043)  . vancomycin 1,250 mg (02/04/20 0535)    Labs: Recent Labs    02/02/20 1948 02/04/20 0401  NA 139 138  K 3.6 2.9*  CL 99 102  CO2 26 22  GLUCOSE 226* 180*  BUN 34* 34*  CREATININE 1.05* 0.86  CALCIUM 9.1 8.9  MG  --  2.0   Recent Labs    02/02/20 1948  AST 31  ALT 18  ALKPHOS 126  BILITOT 0.9  PROT 9.2*  ALBUMIN 3.9   Recent Labs    02/02/20 1948 02/04/20 0401  WBC 11.4* 11.5*  HGB 11.1* 9.6*  HCT 34.7* 29.5*  MCV 85.9 85.3  PLT 225 167   No results for input(s): CKTOTAL, CKMB, TROPONINI in the last 72 hours. Invalid input(s): POCBNP Recent Labs     02/02/20 1948  HGBA1C 7.8*     Weights: Filed Weights   02/02/20 1938 02/04/20 0416  Weight: 79.4 kg 84.7 kg     Radiology/Studies:  DG Chest 2 View  Result Date: 02/02/2020 CLINICAL DATA:  Increased weakness and altered mental status over several days EXAM: CHEST - 2 VIEW COMPARISON:  08/23/2019 FINDINGS: Frontal and lateral views of the chest demonstrate an unremarkable cardiac silhouette. Chronic central vascular congestion without airspace disease, effusion, or pneumothorax. No acute bony abnormalities. IMPRESSION: 1. No acute intrathoracic process. Electronically Signed   By: Sharlet Salina M.D.   On: 02/02/2020 20:36   CT Head Wo Contrast  Result Date: 02/03/2020 CLINICAL DATA:  Altered mental status. EXAM: CT HEAD WITHOUT CONTRAST TECHNIQUE: Contiguous axial images were obtained from the base of the skull through the vertex without intravenous contrast. COMPARISON:  August 23, 2019 FINDINGS: Brain: There is mild cerebral atrophy with widening of the extra-axial spaces and ventricular dilatation. There are areas of decreased attenuation within the white matter tracts of the supratentorial brain, consistent with microvascular disease changes. Vascular: No hyperdense vessel or unexpected calcification. Skull: Normal. Negative for fracture or focal lesion. Sinuses/Orbits: No acute finding. Other: None. IMPRESSION: 1. Generalized cerebral atrophy. 2. No acute intracranial abnormality. Electronically Signed   By: Aram Candela M.D.   On: 02/03/2020 00:03   ECHOCARDIOGRAM COMPLETE  Result Date: 02/04/2020    ECHOCARDIOGRAM REPORT   Patient Name:   Bethany Joseph Date of Exam: 02/03/2020 Medical Rec #:  [id number]        Height:       67.0 in Accession #:    [id number]       Weight:       175.0 lb Date of Birth:  21-Jul-1945         BSA:          1.911 m Patient Age:    74 years         BP:           112/69 mmHg Patient Gender: F                HR:           95 bpm. Exam Location:  [facility]  Procedure: 2D Echo, Color Doppler and Cardiac Doppler Indications:     Elevated troponin  History:         Patient has no prior history of Echocardiogram examinations.                  Arrythmias:Atrial Fibrillation, Signs/Symptoms:Murmur; Risk                  Factors:Diabetes.  Sonographer:     Cristela Blue RDCS (AE) Referring Phys:  [id number] Lyn Hollingshead PARASCHOS Diagnosing Phys: Arnoldo Hooker MD  Sonographer Comments: Technically challenging study due to limited acoustic windows, no apical window and no subcostal window. IMPRESSIONS  1. Left ventricular ejection fraction, by estimation, is 45 to 50%. The left ventricle has mildly decreased function. The left ventricle demonstrates global hypokinesis. Left ventricular diastolic parameters were normal.  2. Right ventricular systolic function is normal. The  right ventricular size is normal.  3. Left atrial size was mildly dilated.  4. The mitral valve is normal in structure. Mild mitral valve regurgitation.  5. The aortic valve is normal in structure. Aortic valve regurgitation is not visualized. FINDINGS  Left Ventricle: Left ventricular ejection fraction, by estimation, is 45 to 50%. The left ventricle has mildly decreased function. The left ventricle demonstrates global hypokinesis. The left ventricular internal cavity size was normal in size. There is  no left ventricular hypertrophy. Left ventricular diastolic parameters were normal. Right Ventricle: The right ventricular size is normal. No increase in right ventricular wall thickness. Right ventricular systolic function is normal. Left Atrium: Left atrial size was mildly dilated. Right Atrium: Right atrial size was normal in size. Pericardium: There is no evidence of pericardial effusion. Mitral Valve: The mitral valve is normal in structure. Mild mitral valve regurgitation. Tricuspid Valve: The tricuspid valve is normal in structure. Tricuspid valve regurgitation is mild. Aortic Valve: The aortic valve is normal  in structure. Aortic valve regurgitation is not visualized. Pulmonic Valve: The pulmonic valve was normal in structure. Pulmonic valve regurgitation is not visualized. Aorta: The aortic root and ascending aorta are structurally normal, with no evidence of dilitation. IAS/Shunts: No atrial level shunt detected by color flow Doppler.  LEFT VENTRICLE PLAX 2D LVIDd:         5.06 cm LVIDs:         3.45 cm LV PW:         1.05 cm LV IVS:        1.00 cm LVOT diam:     2.00 cm LVOT Area:     3.14 cm  LEFT ATRIUM         Index LA diam:    5.00 cm 2.62 cm/m   AORTA Ao Root diam: 3.20 cm TRICUSPID VALVE TR Peak grad:   37.7 mmHg TR Vmax:        307.00 cm/s  SHUNTS Systemic Diam: 2.00 cm Arnoldo Hooker MD Electronically signed by Arnoldo Hooker MD Signature Date/Time: 02/04/2020/9:41:08 AM    Final      Assessment and Recommendation  74 y.o. female with known chronic nonvalvular atrial fibrillation hypertension hyperlipidemia with chronic lower extremity edema and new onset of left lower extremity cellulitis causing infection and sepsis now slightly improved without evidence of acute coronary syndrome 1.  No additional medication management for heart rate control of atrial fibrillation which appears reasonable at this time despite infection and sepsis.  If more rapid rate would consider the possibility of addition of metoprolol at low dose 2.  Continue anticoagulation for further risk reduction in stroke with atrial fibrillation and 5 mg of Eliquis twice per day without change 3.  Continue hypertension control with losartan without change 4.  Continue treatment of left lower extremity edema and cellulitis with antibiotics and diuretics as necessary 5.  No further cardiac diagnostics necessary at this time  Signed, Arnoldo Hooker M.D. FACC

## 2020-02-05 DIAGNOSIS — A409 Streptococcal sepsis, unspecified: Secondary | ICD-10-CM

## 2020-02-05 DIAGNOSIS — A400 Sepsis due to streptococcus, group A: Secondary | ICD-10-CM

## 2020-02-05 LAB — CBC
HCT: 27.9 % — ABNORMAL LOW (ref 36.0–46.0)
Hemoglobin: 8.9 g/dL — ABNORMAL LOW (ref 12.0–15.0)
MCH: 27.2 pg (ref 26.0–34.0)
MCHC: 31.9 g/dL (ref 30.0–36.0)
MCV: 85.3 fL (ref 80.0–100.0)
Platelets: 166 10*3/uL (ref 150–400)
RBC: 3.27 MIL/uL — ABNORMAL LOW (ref 3.87–5.11)
RDW: 16 % — ABNORMAL HIGH (ref 11.5–15.5)
WBC: 8.3 10*3/uL (ref 4.0–10.5)
nRBC: 0.2 % (ref 0.0–0.2)

## 2020-02-05 LAB — IRON AND TIBC
Iron: 19 ug/dL — ABNORMAL LOW (ref 28–170)
Saturation Ratios: 6 % — ABNORMAL LOW (ref 10.4–31.8)
TIBC: 321 ug/dL (ref 250–450)
UIBC: 302 ug/dL

## 2020-02-05 LAB — BASIC METABOLIC PANEL
Anion gap: 13 (ref 5–15)
BUN: 30 mg/dL — ABNORMAL HIGH (ref 8–23)
CO2: 24 mmol/L (ref 22–32)
Calcium: 8.7 mg/dL — ABNORMAL LOW (ref 8.9–10.3)
Chloride: 101 mmol/L (ref 98–111)
Creatinine, Ser: 0.8 mg/dL (ref 0.44–1.00)
GFR, Estimated: >60 mL/min (ref >60)
Glucose, Bld: 208 mg/dL — ABNORMAL HIGH (ref 70–99)
Potassium: 2.8 mmol/L — ABNORMAL LOW (ref 3.5–5.1)
Sodium: 138 mmol/L (ref 135–145)

## 2020-02-05 LAB — GLUCOSE, CAPILLARY
Glucose-Capillary: 171 mg/dL — ABNORMAL HIGH (ref 70–99)
Glucose-Capillary: 174 mg/dL — ABNORMAL HIGH (ref 70–99)
Glucose-Capillary: 152 mg/dL — ABNORMAL HIGH (ref 70–99)

## 2020-02-05 LAB — MAGNESIUM: Magnesium: 1.9 mg/dL (ref 1.7–2.4)

## 2020-02-05 LAB — VITAMIN B12: Vitamin B-12: 251 pg/mL (ref 180–914)

## 2020-02-05 MED ORDER — POTASSIUM CHLORIDE 10 MEQ/100ML IV SOLN
10.0000 meq | Freq: Once | INTRAVENOUS | Status: AC
  Administered 2020-02-05: 13:00:00 10 meq via INTRAVENOUS
  Filled 2020-02-05: qty 100

## 2020-02-05 MED ORDER — POTASSIUM CHLORIDE 10 MEQ/100ML IV SOLN
10.0000 meq | INTRAVENOUS | Status: AC
  Administered 2020-02-05 (×2): 10 meq via INTRAVENOUS
  Filled 2020-02-05 (×2): qty 100

## 2020-02-05 MED ORDER — POTASSIUM CHLORIDE CRYS ER 20 MEQ PO TBCR
40.0000 meq | EXTENDED_RELEASE_TABLET | Freq: Once | ORAL | Status: AC
  Administered 2020-02-05: 06:00:00 40 meq via ORAL
  Filled 2020-02-05: qty 2

## 2020-02-05 NOTE — TOC Initial Note (Signed)
Transition of Care [facility]) - Initial/Assessment Note    Patient Details  Name: Bethany Joseph MRN: [id number] Date of Birth: Mar 26, 1945  Transition of Care [facility] CM/SW Contact:    Chapman Fitch, RN Phone Number: 02/05/2020, 10:18 AM  Clinical Narrative:                 Patient admitted from home with acute encephalopathy Patient admitted from home Lives at home with her Husband  PCP Lurlean Leyden - states that the MD is semi retired, so they will be seeing someone new in the practice  Pharmacy [facility] - denies issues obtaining medications  Patient states that she ambulates with a walker.   Patient has an area on her toe that has been assessed by the [facility] RN.  No open area, patient states that the area has not required dressing changes in some time.    Patient requiring acute O2 PT eval pending   Expected Discharge Plan: Home/Self Care Barriers to Discharge: Continued Medical Work up   Patient Goals and CMS Choice        Expected Discharge Plan and Services Expected Discharge Plan: Home/Self Care       Living arrangements for the past 2 months: Single Family Home                                      Prior Living Arrangements/Services Living arrangements for the past 2 months: Single Family Home Lives with:: Self Patient language and need for interpreter reviewed:: Yes Do you feel safe going back to the place where you live?: Yes      Need for Family Participation in Patient Care: Yes (Comment) Care giver support system in place?: Yes (comment) Current home services: DME Criminal Activity/Legal Involvement Pertinent to Current Situation/Hospitalization: No - Comment as needed  Activities of Daily Living Home Assistive Devices/Equipment: None ADL Screening (condition at time of admission) Patient's cognitive ability adequate to safely complete daily activities?: No Is the patient deaf or have difficulty hearing?: No Does the patient have difficulty  seeing, even when wearing glasses/contacts?: No Does the patient have difficulty concentrating, remembering, or making decisions?: Yes Patient able to express need for assistance with ADLs?: Yes Does the patient have difficulty dressing or bathing?: Yes Independently performs ADLs?: No Communication: Independent Dressing (OT): Needs assistance Is this a change from baseline?: Pre-admission baseline Grooming: Needs assistance Is this a change from baseline?: Pre-admission baseline Feeding: Independent Bathing: Needs assistance Is this a change from baseline?: Pre-admission baseline Toileting: Needs assistance Is this a change from baseline?: Pre-admission baseline In/Out Bed: Needs assistance Is this a change from baseline?: Pre-admission baseline Walks in Home: Needs assistance Is this a change from baseline?: Pre-admission baseline Does the patient have difficulty walking or climbing stairs?: Yes Weakness of Legs: None Weakness of Arms/Hands: None  Permission Sought/Granted                  Emotional Assessment       Orientation: : Oriented to Self,Oriented to Place,Oriented to  Time,Oriented to Situation Alcohol / Substance Use: Not Applicable Psych Involvement: No (comment)  Admission diagnosis:  Rash [R21] Lactic acid acidosis [E87.2] Troponin I above reference range [R77.8] Acute encephalopathy [G93.40] Altered mental status, unspecified altered mental status type [R41.82] Patient Active Problem List   Diagnosis Date Noted  . Acute encephalopathy 02/03/2020  . Left leg cellulitis 02/03/2020  . Elevated troponin  02/03/2020  . Chronic diastolic CHF (congestive heart failure) (HCC) 02/03/2020  . Mild renal insufficiency 02/03/2020  . Depression 02/03/2020  . Acquired hypothyroidism 10/22/2019  . Gout 10/22/2019  . Paroxysmal tachycardia (HCC) 10/22/2019  . Acute cholangitis   . Choledocholithiasis   . Stricture and stenosis of esophagus   . Sepsis (HCC)  08/23/2019  . S/P ORIF (open reduction internal fixation) fracture 08/05/2017  . Cerumen impaction 08/05/2017  . Humerus distal fracture   . Hip fracture (HCC) 07/15/2017  . Closed nondisplaced fracture of left pubis with routine healing 09/18/2016  . Drug-induced constipation 07/24/2016  . Osteoporosis 07/24/2016  . Persistent atrial fibrillation (HCC) 07/24/2016  . Fracture of multiple pubic rami (HCC) 07/19/2016  . Leg pain 06/25/2016  . Varicose veins of lower extremities with ulcer (HCC) 03/26/2016  . Chronic venous insufficiency 03/26/2016  . Essential hypertension 03/26/2016  . Type 2 diabetes mellitus (HCC) 03/26/2016  . COPD (chronic obstructive pulmonary disease) (HCC) 03/26/2016  . Pain in femur 01/22/2013  . Fall 12/07/2012  . Aortic valve disorder 08/21/2012  . Generalized osteoarthritis of multiple sites 08/21/2012  . Status post bilateral knee replacements 08/21/2012   PCP:  Yisroel Ramming, MD Pharmacy:   [facility] [id number] [place], [place] - [address] [place] [address] Phone: [phone] Fax: [phone]  [facility] [place], [place] - [address] [place] [address] Phone: [phone] Fax: [phone]     Social Determinants of Health (SDOH) Interventions    Readmission Risk Interventions Readmission Risk Prevention Plan 02/05/2020  Transportation Screening Complete  Palliative Care Screening Not Applicable  Medication Review (RN Care Manager) Complete  Some recent data might be hidden

## 2020-02-05 NOTE — Progress Notes (Signed)
Notified NP Ouma regarding patient's potassium- 2.8. Appropriate orders were placed.  Will continue to monitor.  Arturo Morton

## 2020-02-05 NOTE — Progress Notes (Signed)
PT Cancellation Note  Patient Details Name: Allisyn Kunz MRN: [id number] DOB: October 30, 1945   Cancelled Treatment:    Reason Eval/Treat Not Completed: Medical issues which prohibited therapy (Per chart review K+ remains outside fo range for exertion/exercise (2.8). Of note, last 2 troponins (12/22) still uptrending, however cardiology does not suspect ACS. Will attempt evaluation at later date/time once appropriate.)   Ellisa Devivo C 02/05/2020, 1:20 PM

## 2020-02-05 NOTE — Care Management Important Message (Signed)
Important Message  Patient Details  Name: Bethany Joseph MRN: [id number] Date of Birth: Apr 09, 1945   Medicare Important Message Given:  Yes     Olegario Messier A Blong Busk 02/05/2020, 11:37 AM

## 2020-02-05 NOTE — Progress Notes (Signed)
PROGRESS NOTE    Bethany SpinnerKarla Joseph  ONG:[id number] DOB: 11/29/1945 DOA: 02/02/2020 PCP: [name], Whitman L, MD   Chief complaint.  Left leg swelling. Brief Narrative:  Bethany BickersKarla Drozdowskiis a 74 y.o.femalewith medical history significant forhypothyroidism, atrial fibrillation on Eliquis, COPD, type 2 diabetes mellitus, chronic diastolic CHF, depression, insomnia, and hypertension, now presenting to the emergency department with pain and erythema involving the left lower extremity and 2 to 3 days of lethargy and confusion. Patient is diagnosed with left lower extremities cellulitis, will start on antibiotics with Rocephin. Patient also had elevated troponin, cardiology consult is obtained. 12/24.  Blood culture came back positive for group G Streptococcus.  Antibiotics changed to Rocephin only.   Assessment & Plan:   Principal Problem:   Acute encephalopathy Active Problems:   Essential hypertension   Type 2 diabetes mellitus (HCC)   COPD (chronic obstructive pulmonary disease) (HCC)   Acquired hypothyroidism   Persistent atrial fibrillation (HCC)   Left leg cellulitis   Elevated troponin   Chronic diastolic CHF (congestive heart failure) (HCC)   Mild renal insufficiency   Depression  #1.  Sepsis secondary to cellulitis. Group G Streptococcus septicemia Left lower extremity cellulitis. Acute metabolic cephalopathy. Patient condition gradually improving, her mental status has improved.  No confusion today. Left leg still swelling with some redness. Blood culture came back with group B streptococcus.  Antibiotics switched to Rocephin alone. Discussed with infectious pharmacist, may consider change to oral Levaquin or cephalexin 1 g every 6 hours for 2 weeks at time of discharge.  Echocardiogram showed ejection fraction 45 to 50%, no gross valvular vegetation.  #2.  Persistent atrial fibrillation. Chronic combined systolic and diastolic congestive heart failure. Non-STEMI ruled  in.  Secondary to sepsis and atrial fibrillation. Appreciate cardiology consult.  No additional work-up scheduled. Heart rate under control, continue Eliquis for anticoagulation. Patient appears to be euvolemic, no exacerbation of congestive heart failure.  3.  Hypokalemia. Patient received 40 mEq oral potassium, will give additional 20 mEq IV potassium, current potassium level 2.8.  #4.  Type 2 diabetes.  Continue current regimen.  4.  COPD. Stable.  #5.  Anemia. Hemoglobin is lower today. Check iron B12 level.  Recheck a CBC tomorrow.    DVT prophylaxis: Eliquis Code Status: Full Family Communication: Husband at bedside Disposition Plan:  .   Status is: Inpatient  Remains inpatient appropriate because:Inpatient level of care appropriate due to severity of illness   Dispo: The patient is from: Home              Anticipated d/c is to: Home              Anticipated d/c date is: 1 day              Patient currently is not medically stable to d/c.        I/O last 3 completed shifts: In: 1270 [P.O.:720; IV Piggyback:550] Out: 2400 [Urine:2400] Total I/O In: 240 [P.O.:240] Out: 400 [Urine:400]     Consultants:   None  Procedures: None  Antimicrobials: Rocephin.  Subjective: Patient doing better today.  She has no confusion, she states that she did not sleep well last night, but mental status has improved. No fever or chills. Left leg still red and slightly warm.  Some swelling. Denies any short of breath or cough. No dysuria hematuria.   Objective: Vitals:   02/04/20 2336 02/05/20 0500 02/05/20 0501 02/05/20 0749  BP: 139/84  121/79 109/74  Pulse: 92  (!)  101 (!) 101  Resp: (!) Temp: 97.6 F (36.4 C)  98.1 F (36.7 C) 97.8 F (36.6 C)  TempSrc: Oral  Oral Oral  SpO2: 98%  99% 100%  Weight:  83.5 kg    Height:        Intake/Output Summary (Last 24 hours) at 02/05/2020 1030 Last data filed at 02/05/2020 0940 Gross per 24 hour   Intake 1270 ml  Output 1600 ml  Net -330 ml   Filed Weights   02/02/20 1938 02/04/20 0416 02/05/20 0500  Weight: 79.4 kg 84.7 kg 83.5 kg    Examination:  General exam: Appears calm and comfortable  Respiratory system: Clear to auscultation. Respiratory effort normal. Cardiovascular system: Irregular, no JVD, murmurs, rubs, gallops or clicks.  Gastrointestinal system: Abdomen is nondistended, soft and nontender. No organomegaly or masses felt. Normal bowel sounds heard. Central nervous system: Alert and oriented. No focal neurological deficits. Extremities: Left leg still red, warm.  Some swelling. Skin: No rashes, lesions or ulcers Psychiatry: Judgement and insight appear normal. Mood & affect appropriate.     Data Reviewed: I have personally reviewed following labs and imaging studies  CBC: Recent Labs  Lab 02/02/20 1948 02/04/20 0401 02/05/20 0415  WBC 11.4* 11.5* 8.3  HGB 11.1* 9.6* 8.9*  HCT 34.7* 29.5* 27.9*  MCV 85.9 85.3 85.3  PLT 225 167 166   Basic Metabolic Panel: Recent Labs  Lab 02/02/20 1948 02/04/20 0401 02/05/20 0415  NA 139 138 138  K 3.6 2.9* 2.8*  CL 99 102 101  CO2 GLUCOSE 226* 180* 208*  BUN 34* 34* 30*  CREATININE 1.05* 0.86 0.80  CALCIUM 9.1 8.9 8.7*  MG  --  2.0 1.9   GFR: Estimated Creatinine Clearance: 67.5 mL/min (by C-G formula based on SCr of 0.8 mg/dL). Liver Function Tests: Recent Labs  Lab 02/02/20 1948  AST 31  ALT 18  ALKPHOS 126  BILITOT 0.9  PROT 9.2*  ALBUMIN 3.9   No results for input(s): LIPASE, AMYLASE in the last 168 hours. Recent Labs  Lab 02/02/20 2326  AMMONIA 11   Coagulation Profile: Recent Labs  Lab 02/02/20 1948  INR 1.3*   Cardiac Enzymes: No results for input(s): CKTOTAL, CKMB, CKMBINDEX, TROPONINI in the last 168 hours. BNP (last 3 results) No results for input(s): PROBNP in the last 8760 hours. HbA1C: Recent Labs    02/02/20 1948  HGBA1C 7.8*   CBG: Recent Labs  Lab  02/03/20 2016 02/04/20 0739 02/04/20 1123 02/04/20 1613 02/04/20 2143  GLUCAP 203* 154* 187* 134* 149*   Lipid Profile: No results for input(s): CHOL, HDL, LDLCALC, TRIG, CHOLHDL, LDLDIRECT in the last 72 hours. Thyroid Function Tests: Recent Labs    02/02/20 1948 02/02/20 2328 02/03/20 0608  TSH <0.010*  --   --   FREET4  --  <0.25*  --   T3FREE  --   --  0.6*   Anemia Panel: Recent Labs    02/03/20 0608  VITAMINB12 179*   Sepsis Labs: Recent Labs  Lab 02/02/20 1948 02/02/20 2325 02/03/20 0203  PROCALCITON  --   --  0.19  LATICACIDVEN 2.2* 1.9  --     Recent Results (from the past 240 hour(s))  Urine culture     Status: Abnormal   Collection Time: 02/02/20  1:06 PM   Specimen: Urine, Random  Result Value Ref Range Status   Specimen Description   Final    URINE,  RANDOM Performed at [facility], [address] [place], [place] [address]    Special Requests   Final    NONE Performed at [facility], [address] [place], [place] [address]    Culture MULTIPLE SPECIES PRESENT, SUGGEST RECOLLECTION (A)  Final   Report Status 02/03/2020 FINAL  Final  Resp Panel by RT-PCR (Flu A&B, Covid) Nasopharyngeal Swab     Status: None   Collection Time: 02/02/20 11:24 PM   Specimen: Nasopharyngeal Swab; Nasopharyngeal(NP) swabs in vial transport medium  Result Value Ref Range Status   SARS Coronavirus 2 by RT PCR NEGATIVE NEGATIVE Final    Comment: (NOTE) SARS-CoV-2 target nucleic acids are NOT DETECTED.  The SARS-CoV-2 RNA is generally detectable in upper respiratory specimens during the acute phase of infection. The lowest concentration of SARS-CoV-2 viral copies this assay can detect is 138 copies/mL. A negative result does not preclude SARS-Cov-2 infection and should not be used as the sole basis for treatment or other patient management decisions. A negative result may occur with  improper specimen collection/handling, submission of  specimen other than nasopharyngeal swab, presence of viral mutation(s) within the areas targeted by this assay, and inadequate number of viral copies(<138 copies/mL). A negative result must be combined with clinical observations, patient history, and epidemiological information. The expected result is Negative.  Fact Sheet for Patients:  BloggerCourse.com  Fact Sheet for Healthcare Providers:  SeriousBroker.it  This test is no t yet approved or cleared by the [place] FDA and  has been authorized for detection and/or diagnosis of SARS-CoV-2 by FDA under an Emergency Use Authorization (EUA). This EUA will remain  in effect (meaning this test can be used) for the duration of the COVID-19 declaration under Section 564(b)(1) of the Act, 21 U.S.C.section 360bbb-3(b)(1), unless the authorization is terminated  or revoked sooner.       Influenza A by PCR NEGATIVE NEGATIVE Final   Influenza B by PCR NEGATIVE NEGATIVE Final    Comment: (NOTE) The Xpert Xpress SARS-CoV-2/FLU/RSV plus assay is intended as an aid in the diagnosis of influenza from Nasopharyngeal swab specimens and should not be used as a sole basis for treatment. Nasal washings and aspirates are unacceptable for Xpert Xpress SARS-CoV-2/FLU/RSV testing.  Fact Sheet for Patients: BloggerCourse.com  Fact Sheet for Healthcare Providers: SeriousBroker.it  This test is not yet approved or cleared by the [place] FDA and has been authorized for detection and/or diagnosis of SARS-CoV-2 by FDA under an Emergency Use Authorization (EUA). This EUA will remain in effect (meaning this test can be used) for the duration of the COVID-19 declaration under Section 564(b)(1) of the Act, 21 U.S.C. section 360bbb-3(b)(1), unless the authorization is terminated or revoked.  Performed at [facility], [address] [place], [place] [address]   Blood culture (routine single)     Status: Abnormal (Preliminary result)   Collection Time: 02/02/20 11:28 PM   Specimen: BLOOD  Result Value Ref Range Status   Specimen Description BLOOD LEFT ANTECUBITAL  Final   Special Requests   Final    BOTTLES DRAWN AEROBIC AND ANAEROBIC Blood Culture adequate volume   Culture  Setup Time   Final    GRAM POSITIVE COCCI IN BOTH AEROBIC AND ANAEROBIC BOTTLES Organism ID to follow CRITICAL RESULT CALLED TO, READ BACK BY AND VERIFIED WITH: K.PATEL,PHARMD AT 1308 ON 02/03/20 BY GM    Culture STREPTOCOCCUS GROUP G (A)  Final   Report Status PENDING  Incomplete  Organism ID, Bacteria STREPTOCOCCUS GROUP G  Final      Susceptibility   Streptococcus group g - MIC*    CLINDAMYCIN <=0.25 SENSITIVE Sensitive     AMPICILLIN <=0.25 SENSITIVE Sensitive     ERYTHROMYCIN <=0.12 SENSITIVE Sensitive     VANCOMYCIN <=0.12 SENSITIVE Sensitive     CEFTRIAXONE <=0.12 SENSITIVE Sensitive     LEVOFLOXACIN Value in next row Sensitive      1 SENSITIVEPerformed at [facility], [address], [place], [place] [address]    * STREPTOCOCCUS GROUP G  Blood Culture ID Panel (Reflexed)     Status: Abnormal   Collection Time: 02/02/20 11:28 PM  Result Value Ref Range Status   Enterococcus faecalis NOT DETECTED NOT DETECTED Final   Enterococcus Faecium NOT DETECTED NOT DETECTED Final   Listeria monocytogenes NOT DETECTED NOT DETECTED Final   Staphylococcus species NOT DETECTED NOT DETECTED Final   Staphylococcus aureus (BCID) NOT DETECTED NOT DETECTED Final   Staphylococcus epidermidis NOT DETECTED NOT DETECTED Final   Staphylococcus lugdunensis NOT DETECTED NOT DETECTED Final   Streptococcus species DETECTED (A) NOT DETECTED Final    Comment: Not Enterococcus species, Streptococcus agalactiae, Streptococcus pyogenes, or Streptococcus pneumoniae. CRITICAL RESULT CALLED TO, READ BACK BY AND VERIFIED WITH: K.PATEL,PHARMD AT 1308 ON  02/03/20 BY GM    Streptococcus agalactiae NOT DETECTED NOT DETECTED Final   Streptococcus pneumoniae NOT DETECTED NOT DETECTED Final   Streptococcus pyogenes NOT DETECTED NOT DETECTED Final   A.calcoaceticus-baumannii NOT DETECTED NOT DETECTED Final   Bacteroides fragilis NOT DETECTED NOT DETECTED Final   Enterobacterales NOT DETECTED NOT DETECTED Final   Enterobacter cloacae complex NOT DETECTED NOT DETECTED Final   Escherichia coli NOT DETECTED NOT DETECTED Final   Klebsiella aerogenes NOT DETECTED NOT DETECTED Final   Klebsiella oxytoca NOT DETECTED NOT DETECTED Final   Klebsiella pneumoniae NOT DETECTED NOT DETECTED Final   Proteus species NOT DETECTED NOT DETECTED Final   Salmonella species NOT DETECTED NOT DETECTED Final   Serratia marcescens NOT DETECTED NOT DETECTED Final   Haemophilus influenzae NOT DETECTED NOT DETECTED Final   Neisseria meningitidis NOT DETECTED NOT DETECTED Final   Pseudomonas aeruginosa NOT DETECTED NOT DETECTED Final   Stenotrophomonas maltophilia NOT DETECTED NOT DETECTED Final   Candida albicans NOT DETECTED NOT DETECTED Final   Candida auris NOT DETECTED NOT DETECTED Final   Candida glabrata NOT DETECTED NOT DETECTED Final   Candida krusei NOT DETECTED NOT DETECTED Final   Candida parapsilosis NOT DETECTED NOT DETECTED Final   Candida tropicalis NOT DETECTED NOT DETECTED Final   Cryptococcus neoformans/gattii NOT DETECTED NOT DETECTED Final    Comment: Performed at [facility], [address] [place], [place] [address]         Radiology Studies: ECHOCARDIOGRAM COMPLETE  Result Date: 02/04/2020    ECHOCARDIOGRAM REPORT   Patient Name:   Bethany Joseph Date of Exam: 02/03/2020 Medical Rec #:  [id number]        Height:       67.0 in Accession #:    [id number]       Weight:       175.0 lb Date of Birth:  02/20/1945         BSA:          1.911 m Patient Age:    74 years         BP:           112/69 mmHg Patient Gender: F  HR:           95 bpm. Exam Location:  [facility] Procedure: 2D Echo, Color Doppler and Cardiac Doppler Indications:     Elevated troponin  History:         Patient has no prior history of Echocardiogram examinations.                  Arrythmias:Atrial Fibrillation, Signs/Symptoms:Murmur; Risk                  Factors:Diabetes.  Sonographer:     Cristela Blue RDCS (AE) Referring Phys:  [id number] Lyn Hollingshead PARASCHOS Diagnosing Phys: Arnoldo Hooker MD  Sonographer Comments: Technically challenging study due to limited acoustic windows, no apical window and no subcostal window. IMPRESSIONS  1. Left ventricular ejection fraction, by estimation, is 45 to 50%. The left ventricle has mildly decreased function. The left ventricle demonstrates global hypokinesis. Left ventricular diastolic parameters were normal.  2. Right ventricular systolic function is normal. The right ventricular size is normal.  3. Left atrial size was mildly dilated.  4. The mitral valve is normal in structure. Mild mitral valve regurgitation.  5. The aortic valve is normal in structure. Aortic valve regurgitation is not visualized. FINDINGS  Left Ventricle: Left ventricular ejection fraction, by estimation, is 45 to 50%. The left ventricle has mildly decreased function. The left ventricle demonstrates global hypokinesis. The left ventricular internal cavity size was normal in size. There is  no left ventricular hypertrophy. Left ventricular diastolic parameters were normal. Right Ventricle: The right ventricular size is normal. No increase in right ventricular wall thickness. Right ventricular systolic function is normal. Left Atrium: Left atrial size was mildly dilated. Right Atrium: Right atrial size was normal in size. Pericardium: There is no evidence of pericardial effusion. Mitral Valve: The mitral valve is normal in structure. Mild mitral valve regurgitation. Tricuspid Valve: The tricuspid valve is normal in structure. Tricuspid valve regurgitation is  mild. Aortic Valve: The aortic valve is normal in structure. Aortic valve regurgitation is not visualized. Pulmonic Valve: The pulmonic valve was normal in structure. Pulmonic valve regurgitation is not visualized. Aorta: The aortic root and ascending aorta are structurally normal, with no evidence of dilitation. IAS/Shunts: No atrial level shunt detected by color flow Doppler.  LEFT VENTRICLE PLAX 2D LVIDd:         5.06 cm LVIDs:         3.45 cm LV PW:         1.05 cm LV IVS:        1.00 cm LVOT diam:     2.00 cm LVOT Area:     3.14 cm  LEFT ATRIUM         Index LA diam:    5.00 cm 2.62 cm/m   AORTA Ao Root diam: 3.20 cm TRICUSPID VALVE TR Peak grad:   37.7 mmHg TR Vmax:        307.00 cm/s  SHUNTS Systemic Diam: 2.00 cm Arnoldo Hooker MD Electronically signed by Arnoldo Hooker MD Signature Date/Time: 02/04/2020/9:41:08 AM    Final         Scheduled Meds: . apixaban  5 mg Oral Q12H  . insulin aspart  0-5 Units Subcutaneous QHS  . insulin aspart  0-9 Units Subcutaneous TID WC  . levothyroxine  25 mcg Oral QAC breakfast  . liothyronine  75 mcg Oral Daily  . losartan  50 mg Oral Daily  . mouth rinse  15 mL Mouth Rinse BID  .  pantoprazole  40 mg Oral Daily  . pravastatin  20 mg Oral q1800   Continuous Infusions: . sodium chloride    . cefTRIAXone (ROCEPHIN)  IV Stopped (02/04/20 1733)     LOS: 2 days    Time spent: 34 minutes    Marrion Coy, MD [facility]   To contact the attending provider between 7A-7P or the covering provider during after hours 7P-7A, please log into the web site [email or web address] and access using universal [facility] password for that web site. If you do not have the password, please call the hospital operator.  02/05/2020, 10:30 AM

## 2020-02-05 NOTE — Progress Notes (Signed)
[facility] Cardiology [facility] Encounter Note  Patient: Bethany Joseph / Admit Date: 02/02/2020 / Date of Encounter: 02/05/2020, 9:34 AM   Subjective: 12/23.  Patient feels much better since admission with significant improvement in clearing of her sensorium.  The patient's infection of left lower extremity appears to be improved with slightly less edema and erythema.  The patient appears to be tolerating her antibiotics well.  Atrial fibrillation appears well controlled heart rate with no evidence of significant concerns.  Patient remains on Eliquis for further risk reduction in stroke with no evidence of bleeding complications There has been no evidence of chest discomfort or EKG changes or acute coronary syndrome despite elevation of troponin most consistent with myocardial injury from atrial fibrillation with rapid ventricular rate and infection  12/24.  Patient has a slight amount of cough and did not sleep perfectly last night but overall has done well since her admission.  Heart rate control of atrial fibrillation without additional medication management has been stable at 90 bpm at rest.  The patient's lower extremity edema and cellulitis slowly improving.  The patient does not have any anginal symptoms or other heart failure type symptoms despite elevation of troponin consistent with demand ischemia.  Echocardiogram showing mild global LV systolic dysfunction with ejection fraction of 45% most consistent with chronic nonvalvular atrial fibrillation rather than congestive heart failure.  There is no evidence of significant valvular heart disease  Review of Systems: Positive for: Leg pain shortness of breath Negative for: Vision change, hearing change, syncope, dizziness, nausea, vomiting,diarrhea, bloody stool, stomach pain, cough, congestion, diaphoresis, urinary frequency, urinary pain,skin lesions, skin rashes Others previously listed  Objective: Telemetry: Atrial fibrillation  with controlled ventricular rate Physical Exam: Blood pressure 109/74, pulse (!) 101, temperature 97.8 F (36.6 C), temperature source Oral, resp. rate 20, height 5\' 7"  (1.702 m), weight 83.5 kg, SpO2 100 %. Body mass index is 28.83 kg/m. General: Well developed, well nourished, in no acute distress. Head: Normocephalic, atraumatic, sclera non-icteric, no xanthomas, nares are without discharge. Neck: No apparent masses Lungs: Normal respirations with no wheezes, no rhonchi, no rales , few crackles   Heart: Irregular rate and rhythm, normal S1 S2, no murmur, no rub, no gallop, PMI is normal size and placement, carotid upstroke normal without bruit, jugular venous pressure normal Abdomen: Soft, non-tender, non-distended with normoactive bowel sounds. No hepatosplenomegaly. Abdominal aorta is normal size without bruit Extremities: Trace to 1+ edema, no clubbing, no cyanosis, no ulcers, with erythema and cellulitis of left leg Peripheral: 2+ radial, 2+ femoral, 2+ dorsal pedal pulses Neuro: Alert and oriented. Moves all extremities spontaneously. Psych:  Responds to questions appropriately with a normal affect.   Intake/Output Summary (Last 24 hours) at 02/05/2020 0934 Last data filed at 02/05/2020 02/07/2020 Gross per 24 hour  Intake 1030 ml  Output 1200 ml  Net -170 ml    Inpatient Medications:  . apixaban  5 mg Oral Q12H  . insulin aspart  0-5 Units Subcutaneous QHS  . insulin aspart  0-9 Units Subcutaneous TID WC  . levothyroxine  25 mcg Oral QAC breakfast  . liothyronine  75 mcg Oral Daily  . losartan  50 mg Oral Daily  . mouth rinse  15 mL Mouth Rinse BID  . pantoprazole  40 mg Oral Daily  . pravastatin  20 mg Oral q1800   Infusions:  . sodium chloride    . cefTRIAXone (ROCEPHIN)  IV Stopped (02/04/20 1733)    Labs: Recent Labs  02/04/20 0401 02/05/20 0415  NA 138 138  K 2.9* 2.8*  CL 102 101  CO2 22 24  GLUCOSE 180* 208*  BUN 34* 30*  CREATININE 0.86 0.80  CALCIUM  8.9 8.7*  MG 2.0 1.9   Recent Labs    02/02/20 1948  AST 31  ALT 18  ALKPHOS 126  BILITOT 0.9  PROT 9.2*  ALBUMIN 3.9   Recent Labs    02/04/20 0401 02/05/20 0415  WBC 11.5* 8.3  HGB 9.6* 8.9*  HCT 29.5* 27.9*  MCV 85.3 85.3  PLT 167 166   No results for input(s): CKTOTAL, CKMB, TROPONINI in the last 72 hours. Invalid input(s): POCBNP Recent Labs    02/02/20 1948  HGBA1C 7.8*     Weights: Filed Weights   02/02/20 1938 02/04/20 0416 02/05/20 0500  Weight: 79.4 kg 84.7 kg 83.5 kg     Radiology/Studies:  DG Chest 2 View  Result Date: 02/02/2020 CLINICAL DATA:  Increased weakness and altered mental status over several days EXAM: CHEST - 2 VIEW COMPARISON:  08/23/2019 FINDINGS: Frontal and lateral views of the chest demonstrate an unremarkable cardiac silhouette. Chronic central vascular congestion without airspace disease, effusion, or pneumothorax. No acute bony abnormalities. IMPRESSION: 1. No acute intrathoracic process. Electronically Signed   By: Sharlet Salina M.D.   On: 02/02/2020 20:36   CT Head Wo Contrast  Result Date: 02/03/2020 CLINICAL DATA:  Altered mental status. EXAM: CT HEAD WITHOUT CONTRAST TECHNIQUE: Contiguous axial images were obtained from the base of the skull through the vertex without intravenous contrast. COMPARISON:  August 23, 2019 FINDINGS: Brain: There is mild cerebral atrophy with widening of the extra-axial spaces and ventricular dilatation. There are areas of decreased attenuation within the white matter tracts of the supratentorial brain, consistent with microvascular disease changes. Vascular: No hyperdense vessel or unexpected calcification. Skull: Normal. Negative for fracture or focal lesion. Sinuses/Orbits: No acute finding. Other: None. IMPRESSION: 1. Generalized cerebral atrophy. 2. No acute intracranial abnormality. Electronically Signed   By: Aram Candela M.D.   On: 02/03/2020 00:03   ECHOCARDIOGRAM COMPLETE  Result Date:  02/04/2020    ECHOCARDIOGRAM REPORT   Patient Name:   Bethany Joseph Date of Exam: 02/03/2020 Medical Rec #:  [id number]        Height:       67.0 in Accession #:    [id number]       Weight:       175.0 lb Date of Birth:  [birth year]         BSA:          1.911 m Patient Age:    74 years         BP:           112/69 mmHg Patient Gender: F                HR:           95 bpm. Exam Location:  [facility] Procedure: 2D Echo, Color Doppler and Cardiac Doppler Indications:     Elevated troponin  History:         Patient has no prior history of Echocardiogram examinations.                  Arrythmias:Atrial Fibrillation, Signs/Symptoms:Murmur; Risk                  Factors:Diabetes.  Sonographer:     Cristela Blue RDCS (AE) Referring Phys:  [phone] Lyn Hollingshead PARASCHOS Diagnosing  Phys: Arnoldo Hooker MD  Sonographer Comments: Technically challenging study due to limited acoustic windows, no apical window and no subcostal window. IMPRESSIONS  1. Left ventricular ejection fraction, by estimation, is 45 to 50%. The left ventricle has mildly decreased function. The left ventricle demonstrates global hypokinesis. Left ventricular diastolic parameters were normal.  2. Right ventricular systolic function is normal. The right ventricular size is normal.  3. Left atrial size was mildly dilated.  4. The mitral valve is normal in structure. Mild mitral valve regurgitation.  5. The aortic valve is normal in structure. Aortic valve regurgitation is not visualized. FINDINGS  Left Ventricle: Left ventricular ejection fraction, by estimation, is 45 to 50%. The left ventricle has mildly decreased function. The left ventricle demonstrates global hypokinesis. The left ventricular internal cavity size was normal in size. There is  no left ventricular hypertrophy. Left ventricular diastolic parameters were normal. Right Ventricle: The right ventricular size is normal. No increase in right ventricular wall thickness. Right ventricular systolic function is  normal. Left Atrium: Left atrial size was mildly dilated. Right Atrium: Right atrial size was normal in size. Pericardium: There is no evidence of pericardial effusion. Mitral Valve: The mitral valve is normal in structure. Mild mitral valve regurgitation. Tricuspid Valve: The tricuspid valve is normal in structure. Tricuspid valve regurgitation is mild. Aortic Valve: The aortic valve is normal in structure. Aortic valve regurgitation is not visualized. Pulmonic Valve: The pulmonic valve was normal in structure. Pulmonic valve regurgitation is not visualized. Aorta: The aortic root and ascending aorta are structurally normal, with no evidence of dilitation. IAS/Shunts: No atrial level shunt detected by color flow Doppler.  LEFT VENTRICLE PLAX 2D LVIDd:         5.06 cm LVIDs:         3.45 cm LV PW:         1.05 cm LV IVS:        1.00 cm LVOT diam:     2.00 cm LVOT Area:     3.14 cm  LEFT ATRIUM         Index LA diam:    5.00 cm 2.62 cm/m   AORTA Ao Root diam: 3.20 cm TRICUSPID VALVE TR Peak grad:   37.7 mmHg TR Vmax:        307.00 cm/s  SHUNTS Systemic Diam: 2.00 cm Arnoldo Hooker MD Electronically signed by Arnoldo Hooker MD Signature Date/Time: 02/04/2020/9:41:08 AM    Final      Assessment and Recommendation  74 y.o. female with known chronic nonvalvular atrial fibrillation hypertension hyperlipidemia with chronic lower extremity edema and new onset of left lower extremity cellulitis causing infection and sepsis now slightly improved without evidence of acute coronary syndrome 1.  No additional medication management for heart rate control of atrial fibrillation which appears reasonable at this time despite infection and sepsis.  If more rapid rate would consider the possibility of addition of metoprolol at low dose 2.  Continue anticoagulation for further risk reduction in stroke with atrial fibrillation and 5 mg of Eliquis twice per day without change 3.  Continue hypertension control with losartan  without change 4.  Continue treatment of left lower extremity edema and cellulitis with antibiotics and diuretics as necessary but appears to be overall slowly improving 5.  No further cardiac diagnostics necessary at this time 6.  No additional changes in cardiac management at this time and if ambulating well will be able to be discharged home with further management as outpatient  Signed, Serafina Royals M.D. FACC

## 2020-02-06 DIAGNOSIS — R6520 Severe sepsis without septic shock: Secondary | ICD-10-CM

## 2020-02-06 DIAGNOSIS — A400 Sepsis due to streptococcus, group A: Secondary | ICD-10-CM

## 2020-02-06 DIAGNOSIS — R778 Other specified abnormalities of plasma proteins: Secondary | ICD-10-CM

## 2020-02-06 LAB — BASIC METABOLIC PANEL
Anion gap: 8 (ref 5–15)
BUN: 27 mg/dL — ABNORMAL HIGH (ref 8–23)
CO2: 25 mmol/L (ref 22–32)
Calcium: 8.8 mg/dL — ABNORMAL LOW (ref 8.9–10.3)
Chloride: 106 mmol/L (ref 98–111)
Creatinine, Ser: 0.78 mg/dL (ref 0.44–1.00)
GFR, Estimated: >60 mL/min (ref >60)
Glucose, Bld: 185 mg/dL — ABNORMAL HIGH (ref 70–99)
Potassium: 4.1 mmol/L (ref 3.5–5.1)
Sodium: 139 mmol/L (ref 135–145)

## 2020-02-06 LAB — CBC
HCT: 27.1 % — ABNORMAL LOW (ref 36.0–46.0)
Hemoglobin: 8.4 g/dL — ABNORMAL LOW (ref 12.0–15.0)
MCH: 26.7 pg (ref 26.0–34.0)
MCHC: 31 g/dL (ref 30.0–36.0)
MCV: 86 fL (ref 80.0–100.0)
Platelets: 188 10*3/uL (ref 150–400)
RBC: 3.15 MIL/uL — ABNORMAL LOW (ref 3.87–5.11)
RDW: 16 % — ABNORMAL HIGH (ref 11.5–15.5)
WBC: 7.4 10*3/uL (ref 4.0–10.5)
nRBC: 0.3 % — ABNORMAL HIGH (ref 0.0–0.2)

## 2020-02-06 LAB — VARICELLA-ZOSTER BY PCR: Varicella-Zoster, PCR: NEGATIVE

## 2020-02-06 LAB — GLUCOSE, CAPILLARY
Glucose-Capillary: 163 mg/dL — ABNORMAL HIGH (ref 70–99)
Glucose-Capillary: 168 mg/dL — ABNORMAL HIGH (ref 70–99)
Glucose-Capillary: 173 mg/dL — ABNORMAL HIGH (ref 70–99)
Glucose-Capillary: 131 mg/dL — ABNORMAL HIGH (ref 70–99)

## 2020-02-06 LAB — MAGNESIUM: Magnesium: 1.8 mg/dL (ref 1.7–2.4)

## 2020-02-06 MED ORDER — DOXYCYCLINE HYCLATE 100 MG PO TABS
100.0000 mg | ORAL_TABLET | Freq: Two times a day (BID) | ORAL | Status: DC
  Administered 2020-02-06 – 2020-02-07 (×3): 100 mg via ORAL
  Filled 2020-02-06 (×3): qty 1

## 2020-02-06 MED ORDER — CYANOCOBALAMIN 1000 MCG/ML IJ SOLN
1000.0000 ug | Freq: Once | INTRAMUSCULAR | Status: AC
  Administered 2020-02-06: 09:00:00 1000 ug via INTRAMUSCULAR
  Filled 2020-02-06: qty 1

## 2020-02-06 MED ORDER — SACCHAROMYCES BOULARDII 250 MG PO CAPS
250.0000 mg | ORAL_CAPSULE | Freq: Two times a day (BID) | ORAL | Status: DC
  Administered 2020-02-06 – 2020-02-07 (×3): 250 mg via ORAL
  Filled 2020-02-06 (×4): qty 1

## 2020-02-06 MED ORDER — SODIUM CHLORIDE 0.9 % IV SOLN
300.0000 mg | Freq: Once | INTRAVENOUS | Status: AC
  Administered 2020-02-06: 12:00:00 300 mg via INTRAVENOUS
  Filled 2020-02-06: qty 15

## 2020-02-06 NOTE — Progress Notes (Signed)
PROGRESS NOTE    Bethany Joseph  EGB:[id number] DOB: [birth year] DOA: 02/02/2020 PCP: Yisroel Ramming, MD   Chief complaint.  Left leg swelling. Brief Narrative:  Bethany Joseph a 73 y.o.femalewith medical history significant forhypothyroidism, atrial fibrillation on Eliquis, COPD, type 2 diabetes mellitus, chronic diastolic CHF, depression, insomnia, and hypertension, now presenting to the emergency department with pain and erythema involving the left lower extremity and 2 to 3 days of lethargy and confusion. Patient is diagnosed with left lower extremities cellulitis, will start on antibiotics with Rocephin. Patient also had elevated troponin, cardiology consult is obtained. 12/24.  Blood culture came back positive for group G Streptococcus.  Antibiotics changed to Rocephin only.   Assessment & Plan:   Principal Problem:   Acute encephalopathy Active Problems:   Essential hypertension   Type 2 diabetes mellitus (HCC)   COPD (chronic obstructive pulmonary disease) (HCC)   Acquired hypothyroidism   Persistent atrial fibrillation (HCC)   Left leg cellulitis   Elevated troponin   Chronic diastolic CHF (congestive heart failure) (HCC)   Mild renal insufficiency   Depression   Sepsis due to group A Streptococcus with acute organ dysfunction without septic shock (HCC)  #1.  Sepsis secondary to cellulitis. Streptococcus Canis septicemia. Left lower extremity cellulitis. Acute metabolic encephalopathy. Patient mental status has improved.  Left leg swelling is better, but redness still present. Echocardiogram did not show any gross valvular vegetation.  Blood culture came back with Streptococcus Canis.  Apparently, there is only 1 blood draw at the first blood culture.  So I cannot rely totally on the resolved to treat a cellulitis.  As a result, I will continue Rocephin, but add doxycycline in case there is MRSA locally. Patient may be able to discharge tomorrow with oral  cephalexin 1 g every 6 hours for 2 weeks and doxycycline for additional 7 days. Repeat culture since yesterday pending.  #2.  Persistent atrial fibrillation. Chronic combined systolic and diastolic congestive heart failure. Non-STEMI. Appreciate cardiology consult.  No additional work-up is indicated.  Continue anticoagulation for stroke prevention.  Currently patient does not have any volume overload.  3.  Hypokalemia.  Potassium normalized.  4.  Iron deficient anemia. Patient has significant iron deficiency, give IV iron.  Borderline B12 level, give B12 shot also check homocystine level. Patient does not have active bleeding.  5.  COPD. Stable.    DVT prophylaxis: Eliquis Code Status: Full Family Communication: None Disposition Plan:  .   Status is: Inpatient  Remains inpatient appropriate because:Inpatient level of care appropriate due to severity of illness   Dispo: The patient is from: Home              Anticipated d/c is to: Home              Anticipated d/c date is: 1 day              Patient currently is not medically stable to d/c.        I/O last 3 completed shifts: In: 1140.4 [P.O.:720; IV Piggyback:420.4] Out: 1650 [Urine:1650] No intake/output data recorded.     Consultants:   Cardiology  Procedures: None  Antimicrobials:  Rocephin  Doxycycline.  Subjective: Patient still complaining left leg pain with some redness.  No fever or chills. No confusion or headaches. Denies any short of breath or cough. No abdominal pain or nausea vomiting.  No diarrhea constipation. No fever or chills. No headache or dizziness. No chest pain or palpitation.  Objective: Vitals:   02/05/20 2122 02/05/20 2336 02/06/20 0429 02/06/20 0621  BP: 115/65 121/73 120/82   Pulse: 96 89 87   Resp: 18 18 18    Temp: 97.6 F (36.4 C) 98 F (36.7 C) 97.8 F (36.6 C)   TempSrc: Oral  Oral   SpO2: 95% 95% 96%   Weight:    85 kg  Height:        Intake/Output  Summary (Last 24 hours) at 02/06/2020 0903 Last data filed at 02/06/2020 02/08/2020 Gross per 24 hour  Intake 1140.42 ml  Output 1150 ml  Net -9.58 ml   Filed Weights   02/04/20 0416 02/05/20 0500 02/06/20 0621  Weight: 84.7 kg 83.5 kg 85 kg    Examination:  General exam: Appears calm and comfortable  Respiratory system: Clear to auscultation. Respiratory effort normal. Cardiovascular system: Irregular. No JVD, murmurs, rubs, gallops or clicks.  Gastrointestinal system: Abdomen is nondistended, soft and nontender. No organomegaly or masses felt. Normal bowel sounds heard. Central nervous system: Alert and oriented x3. No focal neurological deficits. Extremities: Left leg still swelling, but gradually improving. Skin: No rashes, lesions or ulcers Psychiatry: Judgement and insight appear normal. Mood & affect appropriate.     Data Reviewed: I have personally reviewed following labs and imaging studies  CBC: Recent Labs  Lab 02/02/20 1948 02/04/20 0401 02/05/20 0415 02/06/20 0509  WBC 11.4* 11.5* 8.3 7.4  HGB 11.1* 9.6* 8.9* 8.4*  HCT 34.7* 29.5* 27.9* 27.1*  MCV 85.9 85.3 85.3 86.0  PLT 225 167 166 188   Basic Metabolic Panel: Recent Labs  Lab 02/02/20 1948 02/04/20 0401 02/05/20 0415 02/06/20 0509  NA 139 138 138 139  K 3.6 2.9* 2.8* 4.1  CL 99 102 101 106  CO2 26 22 24 25   GLUCOSE 226* 180* 208* 185*  BUN 34* 34* 30* 27*  CREATININE 1.05* 0.86 0.80 0.78  CALCIUM 9.1 8.9 8.7* 8.8*  MG  --  2.0 1.9 1.8   GFR: Estimated Creatinine Clearance: 68.1 mL/min (by C-G formula based on SCr of 0.78 mg/dL). Liver Function Tests: Recent Labs  Lab 02/02/20 1948  AST 31  ALT 18  ALKPHOS 126  BILITOT 0.9  PROT 9.2*  ALBUMIN 3.9   No results for input(s): LIPASE, AMYLASE in the last 168 hours. Recent Labs  Lab 02/02/20 2326  AMMONIA 11   Coagulation Profile: Recent Labs  Lab 02/02/20 1948  INR 1.3*   Cardiac Enzymes: No results for input(s): CKTOTAL, CKMB,  CKMBINDEX, TROPONINI in the last 168 hours. BNP (last 3 results) No results for input(s): PROBNP in the last 8760 hours. HbA1C: No results for input(s): HGBA1C in the last 72 hours. CBG: Recent Labs  Lab 02/04/20 2143 02/05/20 1229 02/05/20 1505 02/05/20 2122 02/06/20 0815  GLUCAP 149* 171* 174* 152* 163*   Lipid Profile: No results for input(s): CHOL, HDL, LDLCALC, TRIG, CHOLHDL, LDLDIRECT in the last 72 hours. Thyroid Function Tests: No results for input(s): TSH, T4TOTAL, FREET4, T3FREE, THYROIDAB in the last 72 hours. Anemia Panel: Recent Labs    02/05/20 0415 02/05/20 0416  VITAMINB12  --  251  TIBC 321  --   IRON 19*  --    Sepsis Labs: Recent Labs  Lab 02/02/20 1948 02/02/20 2325 02/03/20 0203  PROCALCITON  --   --  0.19  LATICACIDVEN 2.2* 1.9  --     Recent Results (from the past 240 hour(s))  Urine culture     Status: Abnormal   Collection  Time: 02/02/20  1:06 PM   Specimen: Urine, Random  Result Value Ref Range Status   Specimen Description   Final    URINE, RANDOM Performed at [facility], [address] [place], [place] [address]    Special Requests   Final    NONE Performed at [facility], [address] [place], [place] [address]    Culture MULTIPLE SPECIES PRESENT, SUGGEST RECOLLECTION (A)  Final   Report Status 02/03/2020 FINAL  Final  Resp Panel by RT-PCR (Flu A&B, Covid) Nasopharyngeal Swab     Status: None   Collection Time: 02/02/20 11:24 PM   Specimen: Nasopharyngeal Swab; Nasopharyngeal(NP) swabs in vial transport medium  Result Value Ref Range Status   SARS Coronavirus 2 by RT PCR NEGATIVE NEGATIVE Final    Comment: (NOTE) SARS-CoV-2 target nucleic acids are NOT DETECTED.  The SARS-CoV-2 RNA is generally detectable in upper respiratory specimens during the acute phase of infection. The lowest concentration of SARS-CoV-2 viral copies this assay can detect is 138 copies/mL. A negative result does not preclude  SARS-Cov-2 infection and should not be used as the sole basis for treatment or other patient management decisions. A negative result may occur with  improper specimen collection/handling, submission of specimen other than nasopharyngeal swab, presence of viral mutation(s) within the areas targeted by this assay, and inadequate number of viral copies(<138 copies/mL). A negative result must be combined with clinical observations, patient history, and epidemiological information. The expected result is Negative.  Fact Sheet for Patients:  BloggerCourse.comhttps://www.fda.gov/media/152166/download  Fact Sheet for Healthcare Providers:  SeriousBroker.ithttps://www.fda.gov/media/152162/download  This test is no t yet approved or cleared by the [place] FDA and  has been authorized for detection and/or diagnosis of SARS-CoV-2 by FDA under an Emergency Use Authorization (EUA). This EUA will remain  in effect (meaning this test can be used) for the duration of the COVID-19 declaration under Section 564(b)(1) of the Act, 21 U.S.C.section 360bbb-3(b)(1), unless the authorization is terminated  or revoked sooner.       Influenza A by PCR NEGATIVE NEGATIVE Final   Influenza B by PCR NEGATIVE NEGATIVE Final    Comment: (NOTE) The Xpert Xpress SARS-CoV-2/FLU/RSV plus assay is intended as an aid in the diagnosis of influenza from Nasopharyngeal swab specimens and should not be used as a sole basis for treatment. Nasal washings and aspirates are unacceptable for Xpert Xpress SARS-CoV-2/FLU/RSV testing.  Fact Sheet for Patients: BloggerCourse.comhttps://www.fda.gov/media/152166/download  Fact Sheet for Healthcare Providers: SeriousBroker.ithttps://www.fda.gov/media/152162/download  This test is not yet approved or cleared by the [place] FDA and has been authorized for detection and/or diagnosis of SARS-CoV-2 by FDA under an Emergency Use Authorization (EUA). This EUA will remain in effect (meaning this test can be used) for the duration of  the COVID-19 declaration under Section 564(b)(1) of the Act, 21 U.S.C. section 360bbb-3(b)(1), unless the authorization is terminated or revoked.  Performed at [facility], [address] [place], [place] [address]   Blood culture (routine single)     Status: Abnormal (Preliminary result)   Collection Time: 02/02/20 11:28 PM   Specimen: BLOOD  Result Value Ref Range Status   Specimen Description   Final    BLOOD LEFT ANTECUBITAL Performed at [facility], [address] [place], [place] [address]    Special Requests   Final    BOTTLES DRAWN AEROBIC AND ANAEROBIC Blood Culture adequate volume Performed at [facility], [address] [place], [place] [address]  Culture  Setup Time   Final    GRAM POSITIVE COCCI IN BOTH AEROBIC AND ANAEROBIC BOTTLES Organism ID to follow CRITICAL RESULT CALLED TO, READ BACK BY AND VERIFIED WITH: K.PATEL,PHARMD AT 1308 ON 02/03/20 BY GM Performed at [facility], [address] [place], [place] [address]    Culture (A)  Final    STREPTOCOCCUS GROUP G STREPTOCOCCUS CANIS SUSCEPTIBILITIES TO FOLLOW Performed at [facility], [address] [place], [place] [address]    Report Status PENDING  Incomplete   Organism ID, Bacteria STREPTOCOCCUS GROUP G  Final      Susceptibility   Streptococcus group g - MIC*    CLINDAMYCIN <=0.25 SENSITIVE Sensitive     AMPICILLIN <=0.25 SENSITIVE Sensitive     ERYTHROMYCIN <=0.12 SENSITIVE Sensitive     VANCOMYCIN <=0.12 SENSITIVE Sensitive     CEFTRIAXONE <=0.12 SENSITIVE Sensitive     LEVOFLOXACIN 1 SENSITIVE Sensitive     * STREPTOCOCCUS GROUP G  Blood Culture ID Panel (Reflexed)     Status: Abnormal   Collection Time: 02/02/20 11:28 PM  Result Value Ref Range Status   Enterococcus faecalis NOT DETECTED NOT DETECTED Final   Enterococcus Faecium NOT DETECTED NOT DETECTED Final   Listeria monocytogenes NOT DETECTED NOT DETECTED Final   Staphylococcus species  NOT DETECTED NOT DETECTED Final   Staphylococcus aureus (BCID) NOT DETECTED NOT DETECTED Final   Staphylococcus epidermidis NOT DETECTED NOT DETECTED Final   Staphylococcus lugdunensis NOT DETECTED NOT DETECTED Final   Streptococcus species DETECTED (A) NOT DETECTED Final    Comment: Not Enterococcus species, Streptococcus agalactiae, Streptococcus pyogenes, or Streptococcus pneumoniae. CRITICAL RESULT CALLED TO, READ BACK BY AND VERIFIED WITH: K.PATEL,PHARMD AT 1308 ON 02/03/20 BY GM    Streptococcus agalactiae NOT DETECTED NOT DETECTED Final   Streptococcus pneumoniae NOT DETECTED NOT DETECTED Final   Streptococcus pyogenes NOT DETECTED NOT DETECTED Final   A.calcoaceticus-baumannii NOT DETECTED NOT DETECTED Final   Bacteroides fragilis NOT DETECTED NOT DETECTED Final   Enterobacterales NOT DETECTED NOT DETECTED Final   Enterobacter cloacae complex NOT DETECTED NOT DETECTED Final   Escherichia coli NOT DETECTED NOT DETECTED Final   Klebsiella aerogenes NOT DETECTED NOT DETECTED Final   Klebsiella oxytoca NOT DETECTED NOT DETECTED Final   Klebsiella pneumoniae NOT DETECTED NOT DETECTED Final   Proteus species NOT DETECTED NOT DETECTED Final   Salmonella species NOT DETECTED NOT DETECTED Final   Serratia marcescens NOT DETECTED NOT DETECTED Final   Haemophilus influenzae NOT DETECTED NOT DETECTED Final   Neisseria meningitidis NOT DETECTED NOT DETECTED Final   Pseudomonas aeruginosa NOT DETECTED NOT DETECTED Final   Stenotrophomonas maltophilia NOT DETECTED NOT DETECTED Final   Candida albicans NOT DETECTED NOT DETECTED Final   Candida auris NOT DETECTED NOT DETECTED Final   Candida glabrata NOT DETECTED NOT DETECTED Final   Candida krusei NOT DETECTED NOT DETECTED Final   Candida parapsilosis NOT DETECTED NOT DETECTED Final   Candida tropicalis NOT DETECTED NOT DETECTED Final   Cryptococcus neoformans/gattii NOT DETECTED NOT DETECTED Final    Comment: Performed at [facility], [address] [place], [place] [address]  CULTURE, BLOOD (ROUTINE X 2) w Reflex to ID Panel     Status: None (Preliminary result)   Collection Time: 02/05/20  1:00 PM   Specimen: BLOOD  Result Value Ref Range Status   Specimen Description BLOOD RIGHT ANTECUBITAL  Final   Special Requests   Final    BOTTLES DRAWN AEROBIC  AND ANAEROBIC Blood Culture results may not be optimal due to an inadequate volume of blood received in culture bottles   Culture   Final    NO GROWTH < 24 HOURS Performed at [facility], [address] [place], [place] [address]    Report Status PENDING  Incomplete  CULTURE, BLOOD (ROUTINE X 2) w Reflex to ID Panel     Status: None (Preliminary result)   Collection Time: 02/05/20  1:18 PM   Specimen: BLOOD  Result Value Ref Range Status   Specimen Description BLOOD RIGHT ANTECUBITAL  Final   Special Requests   Final    BOTTLES DRAWN AEROBIC AND ANAEROBIC Blood Culture adequate volume   Culture   Final    NO GROWTH < 24 HOURS Performed at [facility], [address] [place], [place] [address]    Report Status PENDING  Incomplete         Radiology Studies: No results found.      Scheduled Meds: . apixaban  5 mg Oral Q12H  . doxycycline  100 mg Oral Q12H  . insulin aspart  0-5 Units Subcutaneous QHS  . insulin aspart  0-9 Units Subcutaneous TID WC  . levothyroxine  25 mcg Oral QAC breakfast  . liothyronine  75 mcg Oral Daily  . losartan  50 mg Oral Daily  . mouth rinse  15 mL Mouth Rinse BID  . pantoprazole  40 mg Oral Daily  . pravastatin  20 mg Oral q1800   Continuous Infusions: . sodium chloride    . cefTRIAXone (ROCEPHIN)  IV Stopped (02/05/20 2025)  . iron sucrose       LOS: 3 days    Time spent: 34 minutes    Marrion Coy, MD [facility]   To contact the attending provider between 7A-7P or the covering provider during after hours 7P-7A, please log into the web site [email or web address] and access  using universal [place] password for that web site. If you do not have the password, please call the hospital operator.  02/06/2020, 9:03 AM

## 2020-02-06 NOTE — Progress Notes (Signed)
[facility] Cardiology [facility] Encounter Note  Patient: Bethany Joseph / Admit Date: 02/02/2020 / Date of Encounter: 02/06/2020, 6:07 AM   Subjective: 12/23.  Patient feels much better since admission with significant improvement in clearing of her sensorium.  The patient's infection of left lower extremity appears to be improved with slightly less edema and erythema.  The patient appears to be tolerating her antibiotics well.  Atrial fibrillation appears well controlled heart rate with no evidence of significant concerns.  Patient remains on Eliquis for further risk reduction in stroke with no evidence of bleeding complications There has been no evidence of chest discomfort or EKG changes or acute coronary syndrome despite elevation of troponin most consistent with myocardial injury from atrial fibrillation with rapid ventricular rate and infection  12/24.  Patient has a slight amount of cough and did not sleep perfectly last night but overall has done well since her admission.  Heart rate control of atrial fibrillation without additional medication management has been stable at 90 bpm at rest.  The patient's lower extremity edema and cellulitis slowly improving.  The patient does not have any anginal symptoms or other heart failure type symptoms despite elevation of troponin consistent with demand ischemia.  12/25.  No significant change in cardiovascular symptoms at this time.  Heart rate with atrial fibrillation still reasonable at 90 to 100 bpm at rest.  Patient has not been significantly ambulating but no evidence of cardiac symptoms.  Patient still does have redness and swelling of her left leg insistent with cellulitis for which medication management is being changed  Echocardiogram showing mild global LV systolic dysfunction with ejection fraction of 45% most consistent with chronic nonvalvular atrial fibrillation rather than congestive heart failure.  There is no evidence of significant  valvular heart disease  Review of Systems: Positive for: Leg pain shortness of breath Negative for: Vision change, hearing change, syncope, dizziness, nausea, vomiting,diarrhea, bloody stool, stomach pain, cough, congestion, diaphoresis, urinary frequency, urinary pain,skin lesions, skin rashes Others previously listed  Objective: Telemetry: Atrial fibrillation with controlled ventricular rate Physical Exam: Blood pressure 120/82, pulse 87, temperature 97.8 F (36.6 C), temperature source Oral, resp. rate 18, height 5\' 7"  (1.702 m), weight 83.5 kg, SpO2 96 %. Body mass index is 28.83 kg/m. General: Well developed, well nourished, in no acute distress. Head: Normocephalic, atraumatic, sclera non-icteric, no xanthomas, nares are without discharge. Neck: No apparent masses Lungs: Normal respirations with no wheezes, no rhonchi, no rales , few crackles   Heart: Irregular rate and rhythm, normal S1 S2, no murmur, no rub, no gallop, PMI is normal size and placement, carotid upstroke normal without bruit, jugular venous pressure normal Abdomen: Soft, non-tender, non-distended with normoactive bowel sounds. No hepatosplenomegaly. Abdominal aorta is normal size without bruit Extremities: Trace to 1+ edema, no clubbing, no cyanosis, no ulcers, with erythema and cellulitis of left leg Peripheral: 2+ radial, 2+ femoral, 2+ dorsal pedal pulses Neuro: Alert and oriented. Moves all extremities spontaneously. Psych:  Responds to questions appropriately with a normal affect.   Intake/Output Summary (Last 24 hours) at 02/06/2020 0607 Last data filed at 02/06/2020 0306 Gross per 24 hour  Intake 1140.42 ml  Output 850 ml  Net 290.42 ml    Inpatient Medications:  . apixaban  5 mg Oral Q12H  . insulin aspart  0-5 Units Subcutaneous QHS  . insulin aspart  0-9 Units Subcutaneous TID WC  . levothyroxine  25 mcg Oral QAC breakfast  . liothyronine  75 mcg Oral Daily  .  losartan  50 mg Oral Daily  . mouth  rinse  15 mL Mouth Rinse BID  . pantoprazole  40 mg Oral Daily  . pravastatin  20 mg Oral q1800   Infusions:  . sodium chloride    . cefTRIAXone (ROCEPHIN)  IV Stopped (02/05/20 2025)    Labs: Recent Labs    02/05/20 0415 02/06/20 0509  NA 138 139  K 2.8* 4.1  CL 101 106  CO2 24 25  GLUCOSE 208* 185*  BUN 30* 27*  CREATININE 0.80 0.78  CALCIUM 8.7* 8.8*  MG 1.9 1.8   No results for input(s): AST, ALT, ALKPHOS, BILITOT, PROT, ALBUMIN in the last 72 hours. Recent Labs    02/05/20 0415 02/06/20 0509  WBC 8.3 7.4  HGB 8.9* 8.4*  HCT 27.9* 27.1*  MCV 85.3 86.0  PLT 166 188   No results for input(s): CKTOTAL, CKMB, TROPONINI in the last 72 hours. Invalid input(s): POCBNP No results for input(s): HGBA1C in the last 72 hours.   Weights: Filed Weights   02/02/20 1938 02/04/20 0416 02/05/20 0500  Weight: 79.4 kg 84.7 kg 83.5 kg     Radiology/Studies:  DG Chest 2 View  Result Date: 02/02/2020 CLINICAL DATA:  Increased weakness and altered mental status over several days EXAM: CHEST - 2 VIEW COMPARISON:  08/23/2019 FINDINGS: Frontal and lateral views of the chest demonstrate an unremarkable cardiac silhouette. Chronic central vascular congestion without airspace disease, effusion, or pneumothorax. No acute bony abnormalities. IMPRESSION: 1. No acute intrathoracic process. Electronically Signed   By: Sharlet SalinaMichael  Brown M.D.   On: 02/02/2020 20:36   CT Head Wo Contrast  Result Date: 02/03/2020 CLINICAL DATA:  Altered mental status. EXAM: CT HEAD WITHOUT CONTRAST TECHNIQUE: Contiguous axial images were obtained from the base of the skull through the vertex without intravenous contrast. COMPARISON:  August 23, 2019 FINDINGS: Brain: There is mild cerebral atrophy with widening of the extra-axial spaces and ventricular dilatation. There are areas of decreased attenuation within the white matter tracts of the supratentorial brain, consistent with microvascular disease changes. Vascular:  No hyperdense vessel or unexpected calcification. Skull: Normal. Negative for fracture or focal lesion. Sinuses/Orbits: No acute finding. Other: None. IMPRESSION: 1. Generalized cerebral atrophy. 2. No acute intracranial abnormality. Electronically Signed   By: Aram Candelahaddeus  Houston M.D.   On: 02/03/2020 00:03   ECHOCARDIOGRAM COMPLETE  Result Date: 02/04/2020    ECHOCARDIOGRAM REPORT   Patient Name:   Bethany Joseph Date of Exam: 02/03/2020 Medical Rec #:  [id number]        Height:       67.0 in Accession #:    [id number]       Weight:       175.0 lb Date of Birth:  01/23/1946         BSA:          1.911 m Patient Age:    74 years         BP:           112/69 mmHg Patient Gender: F                HR:           95 bpm. Exam Location:  [facility] Procedure: 2D Echo, Color Doppler and Cardiac Doppler Indications:     Elevated troponin  History:         Patient has no prior history of Echocardiogram examinations.  Arrythmias:Atrial Fibrillation, Signs/Symptoms:Murmur; Risk                  Factors:Diabetes.  Sonographer:     Cristela Blue RDCS (AE) Referring Phys:  [id number] Lyn Hollingshead PARASCHOS Diagnosing Phys: Arnoldo Hooker MD  Sonographer Comments: Technically challenging study due to limited acoustic windows, no apical window and no subcostal window. IMPRESSIONS  1. Left ventricular ejection fraction, by estimation, is 45 to 50%. The left ventricle has mildly decreased function. The left ventricle demonstrates global hypokinesis. Left ventricular diastolic parameters were normal.  2. Right ventricular systolic function is normal. The right ventricular size is normal.  3. Left atrial size was mildly dilated.  4. The mitral valve is normal in structure. Mild mitral valve regurgitation.  5. The aortic valve is normal in structure. Aortic valve regurgitation is not visualized. FINDINGS  Left Ventricle: Left ventricular ejection fraction, by estimation, is 45 to 50%. The left ventricle has mildly decreased  function. The left ventricle demonstrates global hypokinesis. The left ventricular internal cavity size was normal in size. There is  no left ventricular hypertrophy. Left ventricular diastolic parameters were normal. Right Ventricle: The right ventricular size is normal. No increase in right ventricular wall thickness. Right ventricular systolic function is normal. Left Atrium: Left atrial size was mildly dilated. Right Atrium: Right atrial size was normal in size. Pericardium: There is no evidence of pericardial effusion. Mitral Valve: The mitral valve is normal in structure. Mild mitral valve regurgitation. Tricuspid Valve: The tricuspid valve is normal in structure. Tricuspid valve regurgitation is mild. Aortic Valve: The aortic valve is normal in structure. Aortic valve regurgitation is not visualized. Pulmonic Valve: The pulmonic valve was normal in structure. Pulmonic valve regurgitation is not visualized. Aorta: The aortic root and ascending aorta are structurally normal, with no evidence of dilitation. IAS/Shunts: No atrial level shunt detected by color flow Doppler.  LEFT VENTRICLE PLAX 2D LVIDd:         5.06 cm LVIDs:         3.45 cm LV PW:         1.05 cm LV IVS:        1.00 cm LVOT diam:     2.00 cm LVOT Area:     3.14 cm  LEFT ATRIUM         Index LA diam:    5.00 cm 2.62 cm/m   AORTA Ao Root diam: 3.20 cm TRICUSPID VALVE TR Peak grad:   37.7 mmHg TR Vmax:        307.00 cm/s  SHUNTS Systemic Diam: 2.00 cm Arnoldo Hooker MD Electronically signed by Arnoldo Hooker MD Signature Date/Time: 02/04/2020/9:41:08 AM    Final      Assessment and Recommendation  74 y.o. female with known chronic nonvalvular atrial fibrillation hypertension hyperlipidemia with chronic lower extremity edema and new onset of left lower extremity cellulitis causing infection and sepsis now slightly improved without evidence of acute coronary syndrome 1.  No additional medication management for heart rate control of atrial  fibrillation which appears reasonable at this time despite infection and sepsis.  If more rapid rate would consider the possibility of addition of metoprolol at low dose.  Currently stable 2.  Continue anticoagulation for further risk reduction in stroke with atrial fibrillation and 5 mg of Eliquis twice per day without change 3.  Continue hypertension control with losartan without change 4.  Continue treatment of left lower extremity edema and cellulitis with antibiotics and diuretics as necessary but may  need adjustments of medication management  5.  No further cardiac diagnostics necessary at this time 6.  No additional changes in cardiac management at this time and if ambulating well will be able to be discharged home with further management as outpatient  Signed, Arnoldo Hooker M.D. FACC

## 2020-02-07 DIAGNOSIS — I5032 Chronic diastolic (congestive) heart failure: Secondary | ICD-10-CM

## 2020-02-07 LAB — CBC WITH DIFFERENTIAL/PLATELET
Abs Immature Granulocytes: 0.2 10*3/uL — ABNORMAL HIGH (ref 0.00–0.07)
Basophils Absolute: 0 10*3/uL (ref 0.0–0.1)
Basophils Relative: 0 %
Eosinophils Absolute: 0.3 10*3/uL (ref 0.0–0.5)
Eosinophils Relative: 4 %
HCT: 25.7 % — ABNORMAL LOW (ref 36.0–46.0)
Hemoglobin: 8 g/dL — ABNORMAL LOW (ref 12.0–15.0)
Immature Granulocytes: 2 %
Lymphocytes Relative: 20 %
Lymphs Abs: 1.9 10*3/uL (ref 0.7–4.0)
MCH: 26.9 pg (ref 26.0–34.0)
MCHC: 31.1 g/dL (ref 30.0–36.0)
MCV: 86.5 fL (ref 80.0–100.0)
Monocytes Absolute: 1.1 10*3/uL — ABNORMAL HIGH (ref 0.1–1.0)
Monocytes Relative: 12 %
Neutro Abs: 5.8 10*3/uL (ref 1.7–7.7)
Neutrophils Relative %: 62 %
Platelets: 224 10*3/uL (ref 150–400)
RBC: 2.97 MIL/uL — ABNORMAL LOW (ref 3.87–5.11)
RDW: 16 % — ABNORMAL HIGH (ref 11.5–15.5)
WBC: 9.6 10*3/uL (ref 4.0–10.5)
nRBC: 0.6 % — ABNORMAL HIGH (ref 0.0–0.2)

## 2020-02-07 LAB — BASIC METABOLIC PANEL
Anion gap: 9 (ref 5–15)
BUN: 22 mg/dL (ref 8–23)
CO2: 24 mmol/L (ref 22–32)
Calcium: 8.7 mg/dL — ABNORMAL LOW (ref 8.9–10.3)
Chloride: 104 mmol/L (ref 98–111)
Creatinine, Ser: 0.67 mg/dL (ref 0.44–1.00)
GFR, Estimated: >60 mL/min (ref >60)
Glucose, Bld: 155 mg/dL — ABNORMAL HIGH (ref 70–99)
Potassium: 3.4 mmol/L — ABNORMAL LOW (ref 3.5–5.1)
Sodium: 137 mmol/L (ref 135–145)

## 2020-02-07 LAB — CULTURE, BLOOD (SINGLE): Special Requests: ADEQUATE

## 2020-02-07 LAB — GLUCOSE, CAPILLARY
Glucose-Capillary: 175 mg/dL — ABNORMAL HIGH (ref 70–99)
Glucose-Capillary: 165 mg/dL — ABNORMAL HIGH (ref 70–99)

## 2020-02-07 LAB — MAGNESIUM: Magnesium: 1.8 mg/dL (ref 1.7–2.4)

## 2020-02-07 MED ORDER — CEPHALEXIN 500 MG PO CAPS: 1000.0000 mg | ORAL_CAPSULE | Freq: Four times a day (QID) | ORAL | 0 refills | Status: AC

## 2020-02-07 MED ORDER — POTASSIUM CHLORIDE 20 MEQ PO PACK
40.0000 meq | PACK | Freq: Once | ORAL | Status: AC
  Administered 2020-02-07: 10:00:00 40 meq via ORAL
  Filled 2020-02-07: qty 2

## 2020-02-07 MED ORDER — DOXYCYCLINE HYCLATE 100 MG PO TBEC: 100.0000 mg | DELAYED_RELEASE_TABLET | Freq: Two times a day (BID) | ORAL | 0 refills | Status: AC

## 2020-02-07 MED ORDER — ONDANSETRON HCL 4 MG PO TABS: 4.0000 mg | ORAL_TABLET | Freq: Four times a day (QID) | ORAL | 0 refills | Status: DC | PRN

## 2020-02-07 NOTE — Progress Notes (Signed)
[facility] Cardiology [facility] Encounter Note  Patient: Bethany Joseph / Admit Date: 02/02/2020 / Date of Encounter: 02/07/2020, 8:41 AM   Subjective: 12/23.  Patient feels much better since admission with significant improvement in clearing of her sensorium.  The patient's infection of left lower extremity appears to be improved with slightly less edema and erythema.  The patient appears to be tolerating her antibiotics well.  Atrial fibrillation appears well controlled heart rate with no evidence of significant concerns.  Patient remains on Eliquis for further risk reduction in stroke with no evidence of bleeding complications There has been no evidence of chest discomfort or EKG changes or acute coronary syndrome despite elevation of troponin most consistent with myocardial injury from atrial fibrillation with rapid ventricular rate and infection  12/24.  Patient has a slight amount of cough and did not sleep perfectly last night but overall has done well since her admission.  Heart rate control of atrial fibrillation without additional medication management has been stable at 90 bpm at rest.  The patient's lower extremity edema and cellulitis slowly improving.  The patient does not have any anginal symptoms or other heart failure type symptoms despite elevation of troponin consistent with demand ischemia.  12/25.  No significant change in cardiovascular symptoms at this time.  Heart rate with atrial fibrillation still reasonable at 90 to 100 bpm at rest.  Patient has not been significantly ambulating but no evidence of cardiac symptoms.  Patient still does have redness and swelling of her left leg insistent with cellulitis for which medication management is being changed  12/26.  No significant changes in overall condition from yesterday.  Patient has still excellent heart rate control of atrial fibrillation with average heart rate approximately 90 bpm.  There has been no evidence of  significant worsening cardiovascular symptoms.  Patient does have continued slow improvements of cellulitis with some trace lower extremity edema and some heat in the left leg still.  Echocardiogram showing mild global LV systolic dysfunction with ejection fraction of 45% most consistent with chronic nonvalvular atrial fibrillation rather than congestive heart failure.  There is no evidence of significant valvular heart disease  Review of Systems: Positive for: Leg pain shortness of breath Negative for: Vision change, hearing change, syncope, dizziness, nausea, vomiting,diarrhea, bloody stool, stomach pain, cough, congestion, diaphoresis, urinary frequency, urinary pain,skin lesions, skin rashes Others previously listed  Objective: Telemetry: Atrial fibrillation with controlled ventricular rate Physical Exam: Blood pressure 121/73, pulse 66, temperature 98.4 F (36.9 C), temperature source Oral, resp. rate 20, height 5\' 7"  (1.702 m), weight 85 kg, SpO2 97 %. Body mass index is 29.35 kg/m. General: Well developed, well nourished, in no acute distress. Head: Normocephalic, atraumatic, sclera non-icteric, no xanthomas, nares are without discharge. Neck: No apparent masses Lungs: Normal respirations with no wheezes, no rhonchi, no rales , few crackles   Heart: Irregular rate and rhythm, normal S1 S2, no murmur, no rub, no gallop, PMI is normal size and placement, carotid upstroke normal without bruit, jugular venous pressure normal Abdomen: Soft, non-tender, non-distended with normoactive bowel sounds. No hepatosplenomegaly. Abdominal aorta is normal size without bruit Extremities: Trace to 1+ edema, no clubbing, no cyanosis, no ulcers, with erythema and cellulitis of left leg Peripheral: 2+ radial, 2+ femoral, 2+ dorsal pedal pulses Neuro: Alert and oriented. Moves all extremities spontaneously. Psych:  Responds to questions appropriately with a normal affect.   Intake/Output Summary (Last 24  hours) at 02/07/2020 0841 Last data filed at 02/07/2020 0700 Gross  per 24 hour  Intake 720 ml  Output 600 ml  Net 120 ml    Inpatient Medications:  . apixaban  5 mg Oral Q12H  . doxycycline  100 mg Oral Q12H  . insulin aspart  0-5 Units Subcutaneous QHS  . insulin aspart  0-9 Units Subcutaneous TID WC  . levothyroxine  25 mcg Oral QAC breakfast  . liothyronine  75 mcg Oral Daily  . losartan  50 mg Oral Daily  . mouth rinse  15 mL Mouth Rinse BID  . pantoprazole  40 mg Oral Daily  . potassium chloride  40 mEq Oral Once  . pravastatin  20 mg Oral q1800  . saccharomyces boulardii  250 mg Oral BID   Infusions:  . sodium chloride    . cefTRIAXone (ROCEPHIN)  IV 2 g (02/06/20 1659)    Labs: Recent Labs    02/06/20 0509 02/07/20 0543  NA 139 137  K 4.1 3.4*  CL 106 104  CO2 25 24  GLUCOSE 185* 155*  BUN 27* 22  CREATININE 0.78 0.67  CALCIUM 8.8* 8.7*  MG 1.8 1.8   No results for input(s): AST, ALT, ALKPHOS, BILITOT, PROT, ALBUMIN in the last 72 hours. Recent Labs    02/06/20 0509 02/07/20 0543  WBC 7.4 9.6  NEUTROABS  --  5.8  HGB 8.4* 8.0*  HCT 27.1* 25.7*  MCV 86.0 86.5  PLT 188 224   No results for input(s): CKTOTAL, CKMB, TROPONINI in the last 72 hours. Invalid input(s): POCBNP No results for input(s): HGBA1C in the last 72 hours.   Weights: Filed Weights   02/04/20 0416 02/05/20 0500 02/06/20 5916  Weight: 84.7 kg 83.5 kg 85 kg     Radiology/Studies:  DG Chest 2 View  Result Date: 02/02/2020 CLINICAL DATA:  Increased weakness and altered mental status over several days EXAM: CHEST - 2 VIEW COMPARISON:  08/23/2019 FINDINGS: Frontal and lateral views of the chest demonstrate an unremarkable cardiac silhouette. Chronic central vascular congestion without airspace disease, effusion, or pneumothorax. No acute bony abnormalities. IMPRESSION: 1. No acute intrathoracic process. Electronically Signed   By: Sharlet Salina M.D.   On: 02/02/2020 20:36   CT  Head Wo Contrast  Result Date: 02/03/2020 CLINICAL DATA:  Altered mental status. EXAM: CT HEAD WITHOUT CONTRAST TECHNIQUE: Contiguous axial images were obtained from the base of the skull through the vertex without intravenous contrast. COMPARISON:  August 23, 2019 FINDINGS: Brain: There is mild cerebral atrophy with widening of the extra-axial spaces and ventricular dilatation. There are areas of decreased attenuation within the white matter tracts of the supratentorial brain, consistent with microvascular disease changes. Vascular: No hyperdense vessel or unexpected calcification. Skull: Normal. Negative for fracture or focal lesion. Sinuses/Orbits: No acute finding. Other: None. IMPRESSION: 1. Generalized cerebral atrophy. 2. No acute intracranial abnormality. Electronically Signed   By: Aram Candela M.D.   On: 02/03/2020 00:03   ECHOCARDIOGRAM COMPLETE  Result Date: 02/04/2020    ECHOCARDIOGRAM REPORT   Patient Name:   LANAYAH GARTLEY Date of Exam: 02/03/2020 Medical Rec #:  [id number]        Height:       67.0 in Accession #:    [id number]       Weight:       175.0 lb Date of Birth:  05-Jul-1945         BSA:          1.911 m Patient Age:    74 years  BP:           112/69 mmHg Patient Gender: F                HR:           95 bpm. Exam Location:  [facility] Procedure: 2D Echo, Color Doppler and Cardiac Doppler Indications:     Elevated troponin  History:         Patient has no prior history of Echocardiogram examinations.                  Arrythmias:Atrial Fibrillation, Signs/Symptoms:Murmur; Risk                  Factors:Diabetes.  Sonographer:     Cristela BlueJerry Hege RDCS (AE) Referring Phys:  [id number] Lyn HollingsheadALEXANDER PARASCHOS Diagnosing Phys: Arnoldo HookerBruce Caelen Higinbotham MD  Sonographer Comments: Technically challenging study due to limited acoustic windows, no apical window and no subcostal window. IMPRESSIONS  1. Left ventricular ejection fraction, by estimation, is 45 to 50%. The left ventricle has mildly decreased function.  The left ventricle demonstrates global hypokinesis. Left ventricular diastolic parameters were normal.  2. Right ventricular systolic function is normal. The right ventricular size is normal.  3. Left atrial size was mildly dilated.  4. The mitral valve is normal in structure. Mild mitral valve regurgitation.  5. The aortic valve is normal in structure. Aortic valve regurgitation is not visualized. FINDINGS  Left Ventricle: Left ventricular ejection fraction, by estimation, is 45 to 50%. The left ventricle has mildly decreased function. The left ventricle demonstrates global hypokinesis. The left ventricular internal cavity size was normal in size. There is  no left ventricular hypertrophy. Left ventricular diastolic parameters were normal. Right Ventricle: The right ventricular size is normal. No increase in right ventricular wall thickness. Right ventricular systolic function is normal. Left Atrium: Left atrial size was mildly dilated. Right Atrium: Right atrial size was normal in size. Pericardium: There is no evidence of pericardial effusion. Mitral Valve: The mitral valve is normal in structure. Mild mitral valve regurgitation. Tricuspid Valve: The tricuspid valve is normal in structure. Tricuspid valve regurgitation is mild. Aortic Valve: The aortic valve is normal in structure. Aortic valve regurgitation is not visualized. Pulmonic Valve: The pulmonic valve was normal in structure. Pulmonic valve regurgitation is not visualized. Aorta: The aortic root and ascending aorta are structurally normal, with no evidence of dilitation. IAS/Shunts: No atrial level shunt detected by color flow Doppler.  LEFT VENTRICLE PLAX 2D LVIDd:         5.06 cm LVIDs:         3.45 cm LV PW:         1.05 cm LV IVS:        1.00 cm LVOT diam:     2.00 cm LVOT Area:     3.14 cm  LEFT ATRIUM         Index LA diam:    5.00 cm 2.62 cm/m   AORTA Ao Root diam: 3.20 cm TRICUSPID VALVE TR Peak grad:   37.7 mmHg TR Vmax:        307.00 cm/s   SHUNTS Systemic Diam: 2.00 cm Arnoldo HookerBruce Tsuruko Murtha MD Electronically signed by Arnoldo HookerBruce Ellery Tash MD Signature Date/Time: 02/04/2020/9:41:08 AM    Final      Assessment and Recommendation  74 y.o. female with known chronic nonvalvular atrial fibrillation hypertension hyperlipidemia with chronic lower extremity edema and new onset of left lower extremity cellulitis causing infection and sepsis now slightly improved without  evidence of acute coronary syndrome 1.  No additional medication management for heart rate control of atrial fibrillation which appears reasonable at this time despite infection and sepsis.  If more rapid rate would consider the possibility of addition of metoprolol at low dose.  Currently stable and not needing adjustments today 2.  Continue anticoagulation for further risk reduction in stroke with atrial fibrillation and 5 mg of Eliquis twice per day without change 3.  Continue hypertension control with losartan without change 4.  Continue treatment of left lower extremity edema and cellulitis with antibiotics and diuretics as necessary but may need adjustments of medication management  5.  No further cardiac diagnostics necessary at this time 6.  No additional changes in cardiac management at this time and if ambulating well will be able to be discharged home with further management as outpatient 7.  Please call if there is any further questions or concerns or need for further cardiovascular input but otherwise will see the patient as an outpatient for further outpatient adjustments.  Call if further questions  Signed, Arnoldo Hooker M.D. FACC

## 2020-02-07 NOTE — Discharge Summary (Signed)
Physician Discharge Summary  Patient ID: Bethany Joseph MRN: [id number] DOB/AGE: 75-07-1945 75 y.o.  Admit date: 02/02/2020 Discharge date: 02/07/2020  Admission Diagnoses:  Discharge Diagnoses:  Principal Problem:   Acute encephalopathy Active Problems:   Essential hypertension   Type 2 diabetes mellitus (HCC)   COPD (chronic obstructive pulmonary disease) (HCC)   Acquired hypothyroidism   Persistent atrial fibrillation (HCC)   Left leg cellulitis   Elevated troponin   Chronic diastolic CHF (congestive heart failure) (HCC)   Mild renal insufficiency   Depression   Sepsis due to group A Streptococcus with acute organ dysfunction without septic shock [facility])   Discharged Condition: good  Hospital Course:  Bethany Drozdowskiis a 74 y.o.femalewith medical history significant forhypothyroidism, atrial fibrillation on Eliquis, COPD, type 2 diabetes mellitus, chronic diastolic CHF, depression, insomnia, and hypertension, now presenting to the emergency department with pain and erythema involving the left lower extremity and 2 to 3 days of lethargy and confusion. Patient is diagnosed with left lower extremities cellulitis, will start on antibiotics with Rocephin. Patient also had elevated troponin, cardiology consult is obtained. 12/24.Blood culture came back positive for group G Streptococcus. Antibiotics changed to Rocephin only.   #1.  Sepsis secondary to cellulitis. Streptococcus Canis septicemia. Left lower extremity cellulitis. Acute metabolic encephalopathy. Patient mental status has improved.  Left leg swelling is better, but redness still present. Echocardiogram did not show any gross valvular vegetation.  Blood culture came back with Streptococcus Canis.  Apparently, there is only 1 blood draw at the first blood culture.  So I cannot rely totally on the resolved to treat a cellulitis.  As a result, she is treated Rocephin, also add doxycycline in case there is MRSA  locally. Patient will be discharged with oral cephalexin 1 g every 6 hours for 2 weeks and doxycycline for additional 6 days. Repeat culture negative in 2 days  #2.  Persistent atrial fibrillation. Chronic combined systolic and diastolic congestive heart failure. Non-STEMI. Patient is seen and followed by cardiology. No additional work-up is indicated.  Continue anticoagulation for stroke prevention.  Currently patient does not have any volume overload. She will be followed by cardiology as outpatient.  3.  Hypokalemia.  Potassium normalized.  4.  Iron deficient anemia. Patient has significant iron deficiency, received IV iron.  Borderline B12 level, give B12 shot.  Homocystine level still pending.  Please follow-up on results.  I will hold off oral iron supplement as patient is taking antibiotics, worried about GI irritation.  May consider when antibiotics completed. Patient does not have active bleeding.  5.  COPD. Stable.   Consults: cardiology  Significant Diagnostic Studies:     Treatments: Antibiotics  Discharge Exam: Blood pressure (!) 108/57, pulse 87, temperature 98.7 F (37.1 C), temperature source Oral, resp. rate 19, height 5\' 7"  (1.702 m), weight 85 kg, SpO2 91 %. General appearance: alert and cooperative Resp: clear to auscultation bilaterally Cardio: regular rate and rhythm, S1, S2 normal, no murmur, click, rub or gallop GI: soft, non-tender; bowel sounds normal; no masses,  no organomegaly Extremities: Left leg still red, but better, mild edema, none tender  Disposition: Discharge disposition: 01-Home or Self Care       Discharge Instructions    Diet - low sodium heart healthy   Complete by: As directed    Increase activity slowly   Complete by: As directed      Allergies as of 02/07/2020      Reactions   Neosporin [neomycin-polymyxin-gramicidin] Hives  Tape    Other reaction(s): Unknown Adhesive bandage   Codeine Rash   Latex Rash    Neomycin-bacitracin Zn-polymyx Rash   Other reaction(s): Unknown DERMATOLOGICALS   Penicillins Rash, Other (See Comments)   Has patient had a PCN reaction causing immediate rash, facial/tongue/throat swelling, SOB or lightheadedness with hypotension: Unknown Has patient had a PCN reaction causing severe rash involving mucus membranes or skin necrosis: Unknown Has patient had a PCN reaction that required hospitalization: Unknown Has patient had a PCN reaction occurring within the last 10 years: No If all of the above answers are "NO", then may proceed with Cephalosporin use.      Medication List    STOP taking these medications   hydrochlorothiazide 12.5 MG tablet Commonly known as: HYDRODIURIL   vitamin E 180 MG (400 UNITS) capsule     TAKE these medications   acetaminophen 325 MG tablet Commonly known as: TYLENOL Take 650 mg by mouth every 4 (four) hours as needed.   albuterol 108 (90 Base) MCG/ACT inhaler Commonly known as: VENTOLIN HFA USE 2 PUFFS TWICE DAILY AS NEEDED shortness of breath   ALIGN PREBIOTIC-PROBIOTIC PO Take 1 capsule by mouth daily.   bisacodyl 10 MG suppository Commonly known as: DULCOLAX Place 10 mg rectally as needed for moderate constipation.   cephALEXin 500 MG capsule Commonly known as: KEFLEX Take 2 capsules (1,000 mg total) by mouth 4 (four) times daily for 12 days.   Cholecalciferol 100 MCG (4000 UT) Caps Take 1 capsule by mouth daily.   doxepin 10 MG capsule Commonly known as: SINEQUAN Take 10 mg by mouth at bedtime.   doxycycline 100 MG EC tablet Commonly known as: DORYX Take 1 tablet (100 mg total) by mouth 2 (two) times daily for 6 days.   Eliquis 5 MG Tabs tablet Generic drug: apixaban Take 5 mg by mouth every 12 (twelve) hours.   FLORAJEN ACIDOPHILUS PO Take by mouth.   gabapentin 400 MG capsule Commonly known as: NEURONTIN Take 400 mg by mouth at bedtime.   gabapentin 100 MG capsule Commonly known as: NEURONTIN Take  100 mg by mouth daily.   HYDROcodone-acetaminophen 5-325 MG tablet Commonly known as: NORCO/VICODIN Take 1 tablet by mouth every 4 (four) hours as needed.   levothyroxine 25 MCG tablet Commonly known as: SYNTHROID Take 25 mcg by mouth daily before breakfast.   liothyronine 25 MCG tablet Commonly known as: CYTOMEL Take 75 mcg by mouth daily. 4 tabs   loratadine 10 MG tablet Commonly known as: CLARITIN Take 10 mg by mouth daily.   losartan 50 MG tablet Commonly known as: COZAAR Take 50 mg by mouth daily.   Melatonin 10 MG Tabs Take 1 tablet by mouth at bedtime as needed.   metFORMIN 500 MG 24 hr tablet Commonly known as: GLUCOPHAGE-XR Take 1,000 mg by mouth every evening.   nystatin 100000 UNIT/ML suspension Commonly known as: MYCOSTATIN   Omega 3 1200 MG Caps Take 1,200 mg by mouth daily.   omeprazole 40 MG capsule Commonly known as: PRILOSEC TAKE ONE CAPSULE BY MOUTH DAILY   ondansetron 4 MG tablet Commonly known as: ZOFRAN Take 1 tablet (4 mg total) by mouth every 6 (six) hours as needed for nausea.   potassium chloride SA 20 MEQ tablet Commonly known as: KLOR-CON Take 20 mEq by mouth daily.   pravastatin 20 MG tablet Commonly known as: PRAVACHOL Take 20 mg by mouth.   raloxifene 60 MG tablet Commonly known as: EVISTA TAKE ONE (1) TABLET  BY MOUTH EVERY DAY   sennosides-docusate sodium 8.6-50 MG tablet Commonly known as: SENOKOT-S Take 2 tablets by mouth 2 (two) times daily.   simethicone 80 MG chewable tablet Commonly known as: MYLICON Chew 1 tablet (80 mg total) by mouth every 6 (six) hours as needed for flatulence.   torsemide 20 MG tablet Commonly known as: DEMADEX Take 20 mg by mouth 2 (two) times daily.   zolpidem 5 MG tablet Commonly known as: AMBIEN Take 5 mg by mouth at bedtime as needed for sleep.       Follow-up Information    Callwood, Dwayne D, MD Follow up in 1 week(s).   Specialties: Cardiology, Internal Medicine Contact  information: [address] [place] [address] [phone]        Yisroel Ramming, MD Follow up in 1 week(s).   Specialty: Internal Medicine Contact information: [address] [facility] [place] [address] [phone]              36 minutes Signed: Marrion Coy 02/07/2020, 9:50 AM

## 2020-02-07 NOTE — Progress Notes (Signed)
The patient has been discharged. IV has been removed. Telemetry monitoring d/c. Education has been completed with the patient and husband using the teach back method. The patient had a hard time getting on the wheel chair. She refused for provider to evaluate her for SNF recommendation. She reports she would not go to SNF and would only go home. Two person assist to wheel chair.

## 2020-02-07 NOTE — Plan of Care (Signed)
Problem: Education: Goal: Knowledge of General Education information will improve Description: Including pain rating scale, medication(s)/side effects and non-pharmacologic comfort measures Outcome: Progressing   Problem: Education: Goal: Knowledge of General Education information will improve Description: Including pain rating scale, medication(s)/side effects and non-pharmacologic comfort measures 02/07/2020 1308 by Myles Rosenthal, Cory Roughen, RN Outcome: Completed/Met 02/07/2020 1308 by Myles Rosenthal, Cory Roughen, RN Outcome: Progressing   Problem: Clinical Measurements: Goal: Ability to maintain clinical measurements within normal limits will improve 02/07/2020 1308 by Myles Rosenthal, Cory Roughen, RN Outcome: Completed/Met 02/07/2020 1308 by Myles Rosenthal, Cory Roughen, RN Outcome: Progressing Goal: Will remain free from infection 02/07/2020 1308 by Myles Rosenthal, Cory Roughen, RN Outcome: Completed/Met 02/07/2020 1308 by Myles Rosenthal, Cory Roughen, RN Outcome: Progressing Goal: Diagnostic test results will improve 02/07/2020 1308 by Ronna Polio, RN Outcome: Completed/Met 02/07/2020 1308 by Myles Rosenthal, Cory Roughen, RN Outcome: Progressing Goal: Respiratory complications will improve 02/07/2020 1308 by Ronna Polio, RN Outcome: Completed/Met 02/07/2020 1308 by Myles Rosenthal, Cory Roughen, RN Outcome: Progressing Goal: Cardiovascular complication will be avoided 02/07/2020 1308 by Ronna Polio, RN Outcome: Completed/Met 02/07/2020 1308 by Ronna Polio, RN Outcome: Progressing

## 2020-02-08 LAB — HOMOCYSTEINE: Homocysteine: 15.5 umol/L (ref 0.0–19.2)

## 2020-02-10 LAB — CULTURE, BLOOD (ROUTINE X 2)
Culture: NO GROWTH
Culture: NO GROWTH
Special Requests: ADEQUATE

## 2020-02-17 DIAGNOSIS — D509 Iron deficiency anemia, unspecified: Secondary | ICD-10-CM | POA: Insufficient documentation

## 2020-05-10 ENCOUNTER — Encounter: Payer: Self-pay | Admitting: *Deleted

## 2020-06-01 ENCOUNTER — Ambulatory Visit (INDEPENDENT_AMBULATORY_CARE_PROVIDER_SITE_OTHER): Payer: Medicare Other | Admitting: Gastroenterology

## 2020-06-01 ENCOUNTER — Encounter: Payer: Self-pay | Admitting: Gastroenterology

## 2020-06-01 ENCOUNTER — Other Ambulatory Visit: Payer: Self-pay

## 2020-06-01 VITALS — BP 124/71 | HR 78 | Temp 98.1°F | Ht 68.0 in | Wt 180.0 lb

## 2020-06-01 DIAGNOSIS — D500 Iron deficiency anemia secondary to blood loss (chronic): Secondary | ICD-10-CM | POA: Diagnosis not present

## 2020-06-01 NOTE — Progress Notes (Signed)
Primary Care Physician: Dalphine Handing, MD  Primary Gastroenterologist:  Dr. Midge Minium  Chief Complaint  Patient presents with  . iron deficiency anemia    HPI: Bethany Joseph is a 75 y.o. female here for iron deficiency anemia.  The patient's most recent ferritin was normal at 38 and her last iron studies were back in December of last year. The patient had see me in the past for an ERCP with a stone extraction.  She is on Eliqis and was noted to have anemia with a normal MCV.  The patient was recommended to follow up with me due to her anemia.  She denies any black stools or bloody stools.  She also denies any sign of any other bleeding from her urine or peritoneal she is noticed.  There is no report of any unexplained weight loss.  She also denies fevers chills nausea and vomiting.  The patient did have a colonoscopy in 2014 and was recommended to have a repeat colonoscopy in 3 years but did not have that done  Past Medical History:  Diagnosis Date  . Acquired hypothyroidism    Diagnosed with peripheral resistance  . Allergic rhinitis   . Aortic valve disorder   . Arrhythmia   . Arthritis   . Asthma   . Asthma without status asthmaticus    unspecified  . At risk for falls    uses cane, arthritis  . Atrial fibrillation (HCC)    Permanent at this time CHADSVASc=3 (age 89, DM, female) 05/30/09 - Afib at 107 4/25 A fib 88 with better rate control on Multaq, but dc Multaq since in permanent A Fib, refuses Coumadin therapy   . Avascular necrosis (HCC)    left foot  . Diabetes (HCC)   . Diabetic neuropathy (HCC)    unspecified  . Difficult intubation    has a difficult intubation card  . Fracture of distal femur (HCC)    left periprosthetic, status post ORIF  . GERD (gastroesophageal reflux disease)   . Gout   . Heart murmur   . History of atrial fibrillation   . History of chicken pox   . Humerus distal fracture    left - inury on 05/21/12  . Hyperkeratosis     Pre-ulcerative hyperkeratotic lesions first MTPJs  . Hyperlipidemia   . Hypertension   . Hyperthyroidism   . Mycotic toenails   . Osteoarthritis   . Osteoporosis, post-menopausal    Status post bilateral knee replacement. DEXA 09/2009: t=-1.6 spine, t=-3.1 right fem neck  . Paroxysmal tachycardia (HCC)    now in chronic A fib but rate is well controlled  . Type II diabetes mellitus (HCC)     Current Outpatient Medications  Medication Sig Dispense Refill  . acetaminophen (TYLENOL) 325 MG tablet Take 650 mg by mouth every 4 (four) hours as needed.    . Cholecalciferol 4000 units CAPS Take 1 capsule by mouth daily.    Marland Kitchen doxepin (SINEQUAN) 10 MG capsule Take 10 mg by mouth at bedtime.    Marland Kitchen ELIQUIS 5 MG TABS tablet Take 5 mg by mouth every 12 (twelve) hours.  11  . gabapentin (NEURONTIN) 100 MG capsule Take 100 mg by mouth daily.    Marland Kitchen gabapentin (NEURONTIN) 400 MG capsule Take 400 mg by mouth at bedtime.    Marland Kitchen HYDROcodone-acetaminophen (NORCO/VICODIN) 5-325 MG tablet Take 1 tablet by mouth every 4 (four) hours as needed.    . Lactobacillus (FLORAJEN ACIDOPHILUS PO) Take  by mouth.    . levothyroxine (SYNTHROID, LEVOTHROID) 25 MCG tablet Take 25 mcg by mouth daily before breakfast.    . liothyronine (CYTOMEL) 25 MCG tablet Take 75 mcg by mouth daily. 4 tabs    . loratadine (CLARITIN) 10 MG tablet Take 10 mg by mouth daily.    Marland Kitchen losartan (COZAAR) 50 MG tablet Take 50 mg by mouth daily.    . Melatonin 10 MG TABS Take 1 tablet by mouth at bedtime as needed.    . metFORMIN (GLUCOPHAGE-XR) 500 MG 24 hr tablet Take 1,000 mg by mouth every evening.    . Omega 3 1200 MG CAPS Take 1,200 mg by mouth daily.    Marland Kitchen omeprazole (PRILOSEC) 40 MG capsule TAKE ONE CAPSULE BY MOUTH DAILY    . ondansetron (ZOFRAN) 4 MG tablet Take 1 tablet (4 mg total) by mouth every 6 (six) hours as needed for nausea. 20 tablet 0  . potassium chloride SA (KLOR-CON) 20 MEQ tablet Take 20 mEq by mouth daily.    . pravastatin  (PRAVACHOL) 20 MG tablet Take 20 mg by mouth.    . torsemide (DEMADEX) 20 MG tablet Take 20 mg by mouth 2 (two) times daily.    Marland Kitchen zolpidem (AMBIEN) 5 MG tablet Take 5 mg by mouth at bedtime as needed for sleep.    Marland Kitchen albuterol (VENTOLIN HFA) 108 (90 Base) MCG/ACT inhaler USE 2 PUFFS TWICE DAILY AS NEEDED shortness of breath (Patient not taking: No sig reported)    . Bacillus Coagulans-Inulin (ALIGN PREBIOTIC-PROBIOTIC PO) Take 1 capsule by mouth daily. (Patient not taking: No sig reported)    . bisacodyl (DULCOLAX) 10 MG suppository Place 10 mg rectally as needed for moderate constipation. (Patient not taking: No sig reported)    . nystatin (MYCOSTATIN) 100000 UNIT/ML suspension  (Patient not taking: Reported on 06/01/2020)    . raloxifene (EVISTA) 60 MG tablet TAKE ONE (1) TABLET BY MOUTH EVERY DAY (Patient not taking: No sig reported)    . sennosides-docusate sodium (SENOKOT-S) 8.6-50 MG tablet Take 2 tablets by mouth 2 (two) times daily. (Patient not taking: No sig reported)    . simethicone (MYLICON) 80 MG chewable tablet Chew 1 tablet (80 mg total) by mouth every 6 (six) hours as needed for flatulence. (Patient not taking: No sig reported) 30 tablet 0   No current facility-administered medications for this visit.    Allergies as of 06/01/2020 - Review Complete 06/01/2020  Allergen Reaction Noted  . Neosporin [neomycin-polymyxin-gramicidin] Hives 01/25/2016  . Tape  12/18/2013  . Codeine Rash 01/25/2016  . Latex Rash 01/25/2016  . Neomycin-bacitracin zn-polymyx Rash 08/21/2012  . Penicillins Rash and Other (See Comments) 01/25/2016    ROS:  General: Negative for anorexia, weight loss, fever, chills, fatigue, weakness. ENT: Negative for hoarseness, difficulty swallowing , nasal congestion. CV: Negative for chest pain, angina, palpitations, dyspnea on exertion, peripheral edema.  Respiratory: Negative for dyspnea at rest, dyspnea on exertion, cough, sputum, wheezing.  GI: See history of  present illness. GU:  Negative for dysuria, hematuria, urinary incontinence, urinary frequency, nocturnal urination.  Endo: Negative for unusual weight change.    Physical Examination:   BP 124/71   Pulse 78   Temp 98.1 F (36.7 C) (Temporal)   Ht 5\' 8"  (1.727 m)   Wt 180 lb (81.6 kg)   BMI 27.37 kg/m   General: Well-nourished, well-developed in no acute distress.  Eyes: No icterus. Conjunctivae pink. Lungs: Clear to auscultation bilaterally. Non-labored. Heart: Regular rate  and rhythm, no murmurs rubs or gallops.  Neuro: Alert and oriented x 3.  Grossly intact. Skin: Warm and dry, no jaundice.   Psych: Alert and cooperative, normal mood and affect.  Labs:    Imaging Studies: No results found.  Assessment and Plan:   Ericka Marcellus is a 75 y.o. y/o female Who comes in today with anemia.  The patient most recent ferritin was normal.  The patient had low iron back in December when she was in the hospital and at that time was ill.  The patient will be set up for a repeat blood test to check her iron and if it is low then she has been told that she may need a upper endoscopy and colonoscopy to look for source of GI bleeding.  The patient has been explained the plan and agrees with it.     Midge Minium, MD. Clementeen Graham    Note: This dictation was prepared with Dragon dictation along with smaller phrase technology. Any transcriptional errors that result from this process are unintentional.

## 2020-06-02 ENCOUNTER — Telehealth: Payer: Self-pay

## 2020-06-02 LAB — IRON AND TIBC
Iron Saturation: 20 % (ref 15–55)
Iron: 83 ug/dL (ref 27–139)
Total Iron Binding Capacity: 411 ug/dL (ref 250–450)
UIBC: 328 ug/dL (ref 118–369)

## 2020-06-02 NOTE — Telephone Encounter (Signed)
Pt notified of lab results

## 2020-06-02 NOTE — Telephone Encounter (Signed)
-----   Message from Midge Minium, MD sent at 06/02/2020  7:39 AM EDT ----- Let the patient know her iron studies are back to normal and since she is not taking iron there is no need to proceed with any GI workup at this time.

## 2020-11-02 DIAGNOSIS — Z79899 Other long term (current) drug therapy: Secondary | ICD-10-CM | POA: Insufficient documentation

## 2020-12-26 ENCOUNTER — Other Ambulatory Visit: Payer: Self-pay | Admitting: Internal Medicine

## 2020-12-26 ENCOUNTER — Other Ambulatory Visit (HOSPITAL_COMMUNITY): Payer: Self-pay | Admitting: Internal Medicine

## 2020-12-26 DIAGNOSIS — I824Y2 Acute embolism and thrombosis of unspecified deep veins of left proximal lower extremity: Secondary | ICD-10-CM

## 2020-12-26 DIAGNOSIS — R600 Localized edema: Secondary | ICD-10-CM

## 2020-12-27 ENCOUNTER — Other Ambulatory Visit: Payer: Self-pay

## 2020-12-27 ENCOUNTER — Ambulatory Visit
Admission: RE | Admit: 2020-12-27 | Discharge: 2020-12-27 | Disposition: A | Discharge status: Home / Self Care | Payer: Medicare Other | Source: Ambulatory Visit | Attending: Internal Medicine | Admitting: Internal Medicine

## 2020-12-27 DIAGNOSIS — R600 Localized edema: Secondary | ICD-10-CM | POA: Diagnosis present

## 2020-12-27 DIAGNOSIS — I824Y2 Acute embolism and thrombosis of unspecified deep veins of left proximal lower extremity: Secondary | ICD-10-CM | POA: Diagnosis present

## 2021-02-23 ENCOUNTER — Encounter (INDEPENDENT_AMBULATORY_CARE_PROVIDER_SITE_OTHER): Payer: Self-pay | Admitting: Vascular Surgery

## 2021-02-23 ENCOUNTER — Other Ambulatory Visit: Payer: Self-pay

## 2021-02-23 ENCOUNTER — Ambulatory Visit (INDEPENDENT_AMBULATORY_CARE_PROVIDER_SITE_OTHER): Payer: Medicare Other | Admitting: Vascular Surgery

## 2021-02-23 VITALS — BP 120/75 | HR 72 | Resp 16

## 2021-02-23 DIAGNOSIS — I890 Lymphedema, not elsewhere classified: Secondary | ICD-10-CM | POA: Diagnosis not present

## 2021-02-23 DIAGNOSIS — I10 Essential (primary) hypertension: Secondary | ICD-10-CM | POA: Diagnosis not present

## 2021-02-23 DIAGNOSIS — J439 Emphysema, unspecified: Secondary | ICD-10-CM

## 2021-02-23 DIAGNOSIS — I872 Venous insufficiency (chronic) (peripheral): Secondary | ICD-10-CM

## 2021-02-23 DIAGNOSIS — I4819 Other persistent atrial fibrillation: Secondary | ICD-10-CM

## 2021-02-23 NOTE — Progress Notes (Signed)
MRN : [id number]  Bethany Joseph is a 76 y.o. (May 03, 1945) female who presents with chief complaint of leg swelling.  History of Present Illness:   Patient is seen for evaluation of leg pain and leg swelling. The patient first noticed the swelling remotely. The swelling is associated with pain and discoloration. The pain and swelling worsens with prolonged dependency and improves with elevation. The pain is unrelated to activity.  The patient notes that in the morning the legs are significantly improved but they steadily worsened throughout the course of the day. The patient also notes a steady worsening of the discoloration in the ankle and shin area.   The patient denies claudication symptoms.  The patient denies symptoms consistent with rest pain.  The patient has no had any past angiography, interventions or vascular surgery.  Elevation makes the leg symptoms better, dependency makes them much worse. There is no history of ulcerations. The patient denies any recent changes in medications.  The patient has been wearing graduated compression on a daily basis.  The patient denies a history of DVT or PE. There is no prior history of phlebitis. There is no history of primary lymphedema.  No history of malignancies. No history of trauma or groin or pelvic surgery. There is no history of radiation treatment to the groin or pelvis  The patient denies amaurosis fugax or recent TIA symptoms. There are no recent neurological changes noted. The patient denies recent episodes of angina or shortness of breath   No outpatient medications have been marked as taking for the 02/23/21 encounter (Appointment) with Delana Meyer, Dolores Lory, MD.    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   Diabetes ([place])    Diabetic neuropathy ([place])    unspecified   Difficult intubation    has a difficult intubation card   Fracture of distal femur ([place])    left periprosthetic, status post ORIF   GERD (gastroesophageal reflux disease)    Gout    Heart murmur    History of atrial fibrillation    History of chicken pox    Humerus distal fracture    left - [place] on 05/21/12   Hyperkeratosis    Pre-ulcerative hyperkeratotic lesions first MTPJs   Hyperlipidemia    Hypertension    Hyperthyroidism    Mycotic toenails    Osteoarthritis    Osteoporosis, post-menopausal    Status post bilateral knee replacement. DEXA 09/2009: t=-1.6 spine, t=-3.1 right fem neck   Paroxysmal tachycardia (HCC)    now in chronic A fib but rate is well controlled   Type II diabetes mellitus ([place])     Past Surgical History:  Procedure Laterality Date   CHOLECYSTECTOMY     ENDOSCOPIC RETROGRADE CHOLANGIOPANCREATOGRAPHY (ERCP) WITH PROPOFOL N/A 08/25/2019   Procedure: ENDOSCOPIC RETROGRADE CHOLANGIOPANCREATOGRAPHY (ERCP) WITH PROPOFOL;  Surgeon: Lucilla Lame, MD;  Location: [facility] ENDOSCOPY;  Service: Endoscopy;  Laterality: N/A;   ERCP N/A 11/03/2019   Procedure: ENDOSCOPIC RETROGRADE CHOLANGIOPANCREATOGRAPHY (ERCP);  Surgeon: Lucilla Lame, MD;  Location: [facility] ENDOSCOPY;  Service: Endoscopy;  Laterality: N/A;   FEMUR FRACTURE SURGERY Left 2010   INTRAMEDULLARY (IM) NAIL INTERTROCHANTERIC Right 07/15/2017   Procedure: INTRAMEDULLARY (IM) NAIL INTERTROCHANTRIC;  Surgeon: Hessie Knows, MD;  Location: [facility] ORS;  Service: Orthopedics;  Laterality: Right;   JOINT REPLACEMENT  2007   knee replacement surgeries   ORIF FIBULA  FRACTURE     ORIF HUMERUS FRACTURE Left 05/28/2012   supracondylar distal   REPLACEMENT TOTAL KNEE BILATERAL     THYROIDECTOMY  1975   [place]   TOTAL ABDOMINAL HYSTERECTOMY W/ BILATERAL SALPINGOOPHORECTOMY  1983   [place]   TOTAL KNEE ARTHROPLASTY Bilateral     Social History Social History   Tobacco Use   Smoking status: Former    Types: Cigarettes    Quit date: 02/13/1979    Years since quitting: 42.0   Smokeless tobacco: Never  Vaping Use   Vaping Use: Never used  Substance Use Topics   Alcohol use: Yes    Alcohol/week: 14.0 standard drinks    Types: 14 Glasses of wine per week   Drug use: No    Family History Family History  Problem Relation Age of Onset   Colon cancer Mother    Osteoporosis Mother    Arthritis Mother    Early death Mother    Diabetes Father    Hypertension Father    Heart attack Father    Coronary artery disease Father    Early death Father    Heart disease Father    Hyperlipidemia Father    Depression Brother    Diabetes Brother    Mental illness Brother    Asthma Maternal Grandfather    COPD Maternal Grandfather    Asthma Maternal Aunt    COPD Maternal Aunt    Cancer Maternal Aunt     Allergies  Allergen Reactions   Neosporin [Neomycin-Polymyxin-Gramicidin] Hives   Tape     Other reaction(s): Unknown Adhesive bandage   Codeine Rash   Latex Rash   Neomycin-Bacitracin Zn-Polymyx Rash    Other reaction(s): Unknown DERMATOLOGICALS    Penicillins Rash and Other (See Comments)    Has patient had a PCN reaction causing immediate rash, facial/tongue/throat swelling, SOB or lightheadedness with hypotension: Unknown Has patient had a PCN reaction causing severe rash involving mucus membranes or skin necrosis: Unknown Has patient had a PCN reaction that required hospitalization: Unknown Has patient had a PCN reaction occurring within the last 10 years: No If all of the above answers are "NO", then may proceed with Cephalosporin use.       REVIEW OF SYSTEMS (Negative unless checked)  Constitutional: [] Weight loss  [] Fever  [] Chills Cardiac: [] Chest pain   [] Chest pressure   [] Palpitations   [] Shortness of breath when laying flat   [] Shortness of breath with exertion. Vascular:  [] Pain in legs with walking   [] Pain in legs at rest  [] History of DVT   [] Phlebitis   [x] Swelling in legs   [] Varicose veins   [] Non-healing ulcers Pulmonary:   [] Uses home oxygen   [] Productive cough   [] Hemoptysis   [] Wheeze  [] COPD   [] Asthma Neurologic:  [] Dizziness   [] Seizures   [] History of stroke   [] History of TIA  [] Aphasia   [] Vissual changes   [] Weakness or numbness in arm   [] Weakness or numbness in  leg Musculoskeletal:   [] Joint swelling   [] Joint pain   [] Low back pain Hematologic:  [] Easy bruising  [] Easy bleeding   [] Hypercoagulable state   [] Anemic Gastrointestinal:  [] Diarrhea   [] Vomiting  [] Gastroesophageal reflux/heartburn   [] Difficulty swallowing. Genitourinary:  [] Chronic kidney disease   [] Difficult urination  [] Frequent urination   [] Blood in urine Skin:  [] Rashes   [] Ulcers  Psychological:  [] History of anxiety   []  History of major depression.  Physical Examination  There were no vitals filed for this visit. There is no height or weight on file to calculate BMI. Gen: WD/WN, NAD Head: [place]/AT, No temporalis wasting.  Ear/Nose/Throat: Hearing grossly intact, nares w/o erythema or drainage, pinna without lesions Eyes: PER, EOMI, sclera nonicteric.  Neck: Supple, no gross masses.  No JVD.  Pulmonary:  Good air movement, no audible wheezing, no use of accessory muscles.  Cardiac: RRR, precordium not hyperdynamic. Vascular:  scattered varicosities present bilaterally.  Mild venous stasis changes to the legs bilaterally.  4+ soft pitting edema  Vessel Right Left  Radial Palpable Palpable  Gastrointestinal: soft, non-distended. No guarding/no peritoneal signs.  Musculoskeletal: M/S 5/5 throughout.  No deformity.   Neurologic: CN 2-12 intact. Pain and light touch intact in extremities.  Symmetrical.  Speech is fluent. Motor exam as listed above. Psychiatric: Judgment intact, Mood & affect appropriate for pt's clinical situation. Dermatologic: Venous rashes no ulcers noted.  No changes consistent with cellulitis. Lymph : No lichenification or skin changes of chronic lymphedema.  CBC Lab Results  Component Value Date   WBC 9.6 02/07/2020   HGB 8.0 (L) 02/07/2020   HCT 25.7 (L) 02/07/2020   MCV 86.5 02/07/2020   PLT 224 02/07/2020    BMET    Component Value Date/Time   NA 137 02/07/2020 0543   NA 136 05/27/2012 0917   K 3.4 (L) 02/07/2020 0543   K 4.0 05/27/2012 0917   CL 104 02/07/2020 0543   CL 102 05/27/2012 0917   CO2 24 02/07/2020 0543   CO2 28 05/27/2012 0917   GLUCOSE 155 (H) 02/07/2020 0543   GLUCOSE 140 (H) 05/27/2012 0917   BUN 22 02/07/2020 0543   BUN 13 05/27/2012 0917   CREATININE 0.67 02/07/2020 0543   CREATININE 0.58 (L) 05/27/2012 0917   CALCIUM 8.7 (L) 02/07/2020 0543   CALCIUM 9.3 05/27/2012 0917   GFRNONAA >60 02/07/2020 0543   GFRNONAA >60 05/27/2012 0917   GFRAA >60 08/27/2019 0718   GFRAA >60 05/27/2012 0917   CrCl cannot be calculated (Patient's most recent lab result is older than the maximum 21 days allowed.).  COAG Lab Results  Component Value Date   INR 1.3 (H) 02/02/2020   INR 1.2 08/23/2019   INR 1.08 07/19/2016    Radiology No results found.   Assessment/Plan 1. Lymphedema Recommend:  No surgery or intervention at this point in time.    I have reviewed my previous discussion with the patient regarding swelling and why it causes symptoms.  Patient will continue wearing graduated compression stockings class 1 (20-30 mmHg) on a daily basis. The patient will  beginning wearing the stockings first thing in the morning and removing them in the evening. The patient is instructed specifically not to sleep in the stockings.    In addition,  behavioral modification including several periods of elevation of the lower extremities during the day will be continued.  This was reviewed with the patient during the initial visit.  The patient will also continue routine exercise,  especially walking on a daily basis as was discussed during the initial visit.    Despite conservative treatments including graduated compression therapy class 1 and behavioral modification including exercise and elevation the patient  has not obtained adequate control of the lymphedema.  The patient still has stage 3 lymphedema and therefore, I believe that a lymph pump should be added to improve the control of the patient's lymphedema.  Additionally, a lymph pump is warranted because it will reduce the risk of cellulitis and ulceration in the future.  Patient should follow-up in six months    2. Chronic venous insufficiency Recommend:  No surgery or intervention at this point in time.    I have reviewed my previous discussion with the patient regarding swelling and why it causes symptoms.  Patient will continue wearing graduated compression stockings class 1 (20-30 mmHg) on a daily basis. The patient will  beginning wearing the stockings first thing in the morning and removing them in the evening. The patient is instructed specifically not to sleep in the stockings.    In addition, behavioral modification including several periods of elevation of the lower extremities during the day will be continued.  This was reviewed with the patient during the initial visit.  The patient will also continue routine exercise, especially walking on a daily basis as was discussed during the initial visit.    Despite conservative treatments including graduated compression therapy class 1 and behavioral modification including exercise and elevation the patient  has not obtained adequate control of the lymphedema.  The patient still has stage 3 lymphedema and therefore, I believe that  a lymph pump should be added to improve the control of the patient's lymphedema.  Additionally, a lymph pump is warranted because it will reduce the risk of cellulitis and ulceration in the future.  Patient should follow-up in six months    3. Essential hypertension Continue antihypertensive medications as already ordered, these medications have been reviewed and there are no changes at this time.   4. Persistent atrial fibrillation (HCC) Continue antiarrhythmia medications as already ordered, these medications have been reviewed and there are no changes at this time.  Continue anticoagulation as ordered by Cardiology Service   5. Pulmonary emphysema, unspecified emphysema type ([place]) Continue pulmonary medications and aerosols as already ordered, these medications have been reviewed and there are no changes at this time.      Hortencia Pilar, MD  02/23/2021 1:02 PM

## 2021-02-24 ENCOUNTER — Encounter (INDEPENDENT_AMBULATORY_CARE_PROVIDER_SITE_OTHER): Payer: Self-pay | Admitting: Vascular Surgery

## 2021-06-18 IMAGING — MR MR ABDOMEN WO/W CM MRCP
22 of 23 series · 46 of 48 positions shown · IV contrast (7.5ml Gadavist)
Comparison: Right upper quadrant ultrasound same day

CLINICAL DATA: Right upper quadrant pain.  Biliary dilatation.

EXAM:
MRI ABDOMEN WITHOUT AND WITH CONTRAST (INCLUDING MRCP)
TECHNIQUE: Multiplanar multisequence MR imaging of the abdomen was performed
both before and after the administration of intravenous contrast.
Heavily T2-weighted images of the biliary and pancreatic ducts were
obtained, and three-dimensional MRCP images were rendered by post
processing.
CONTRAST:  7.5mL GADAVIST GADOBUTROL 1 MMOL/ML IV SOLN

[Series 3: T2 · coronal · 6.0mm · 1.25mm/px · 1 of 32 slices shown (1 of 2)]
[im 1/32]
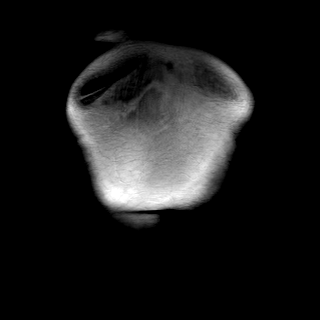

[Series 4: T2 · axial · 6.0mm · 1.19mm/px · 1 of 30 slices shown (2 of 2)]
[im 1/30]
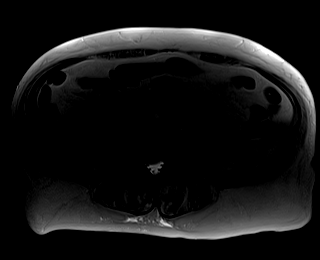

[Series 7: T2 fat-sat · axial · 6.0mm · 1.19mm/px · 1 of 30 slices shown]
[im 1/30]
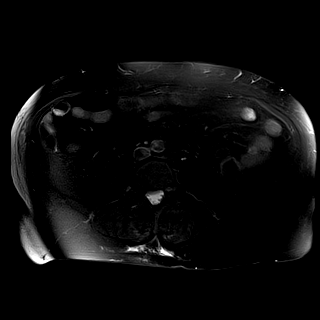

[Series 8: T1 · axial · 6.0mm · 0.74mm/px · 1 of 32 slices shown (1 of 2)]
[im 1/32]
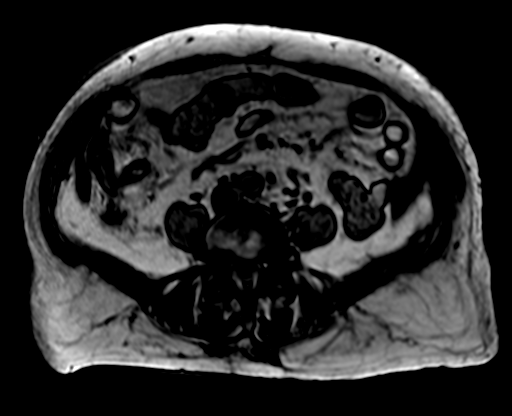

[Series 8: T1 · axial · 6.0mm · 0.74mm/px · 1 of 32 slices shown (2 of 2)]
[im 1/32]
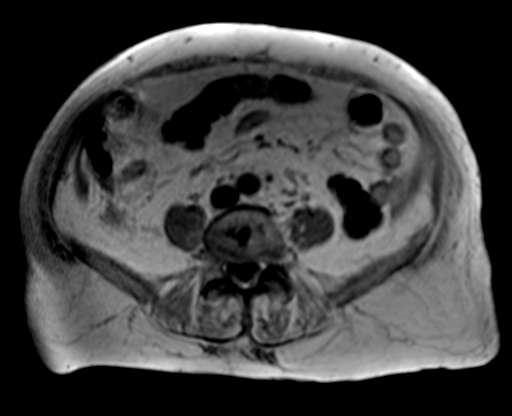

[Series 9: ax dwi_tracew · axial · 6.0mm · 1.42mm/px · z∈[-44,+165]mm · 3 of 90 slices shown]
[im 1/90]
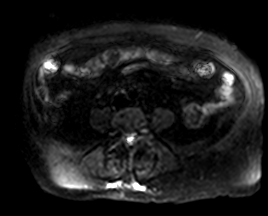
[im 45/90]
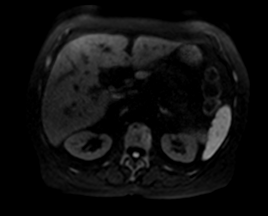
[im 90/90]
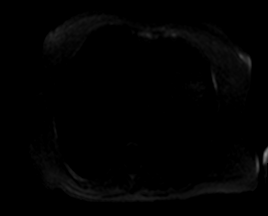

[Series 10: ax dwi_adc · axial · 6.0mm · 1.42mm/px · 1 of 30 slices shown]
[im 1/30]
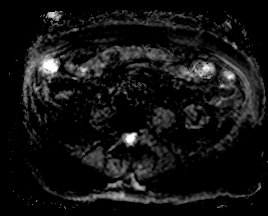

[Series 11: bSSFP · axial · 6.0mm · 0.74mm/px · 1 of 30 slices shown]
[im 1/30]
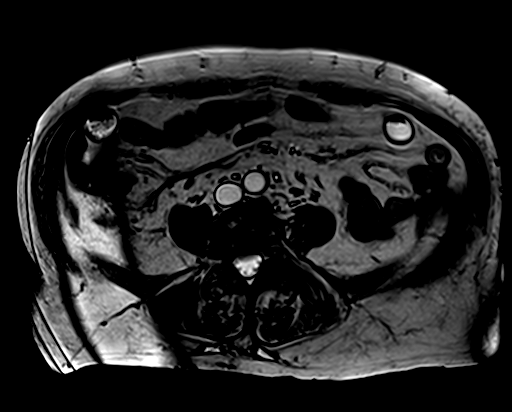

[Series 15: radials · coronal · 50.0mm · 0.78mm/px · 1 of 5 slices shown]
[im 1/5]
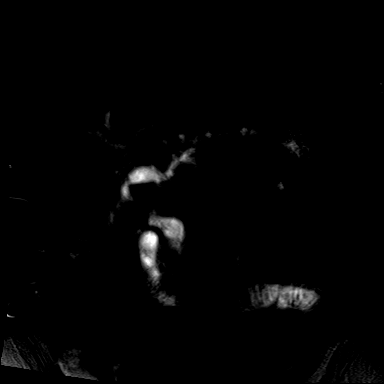

[Series 16: T1 fat-sat · axial · 3.0mm · 1.56mm/px · z∈[-50,+163]mm · 3 of 72 slices shown]
[im 1/72]
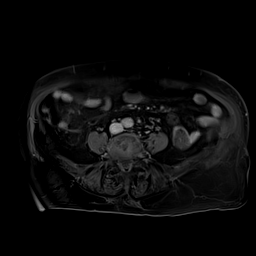
[im 36/72]
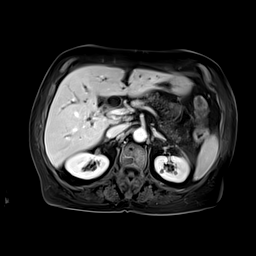
[im 72/72]
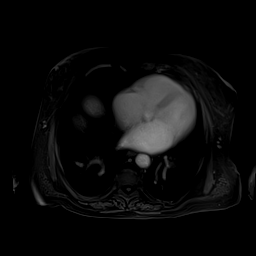

[Series 17: T1 dynamic fat-sat · axial · 3.0mm · 1.56mm/px · z∈[-50,+163]mm · 3 of 72 slices shown (1 of 9)]
[im 1/72]
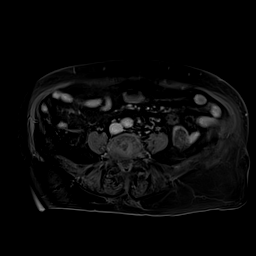
[im 36/72]
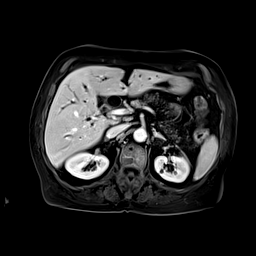
[im 72/72]
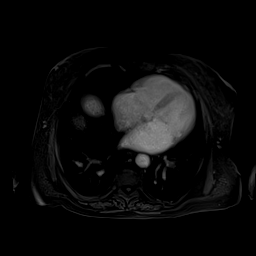

[Series 17: T1 dynamic fat-sat · axial · 3.0mm · 1.56mm/px · z∈[-50,+163]mm · 3 of 72 slices shown (2 of 9)]
[im 1/72]
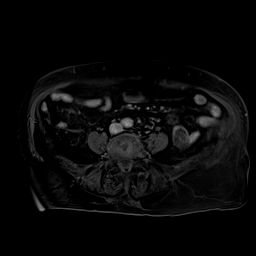
[im 36/72]
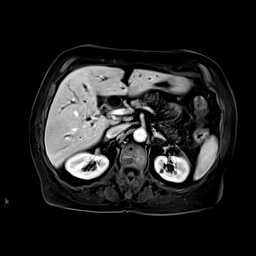
[im 72/72]
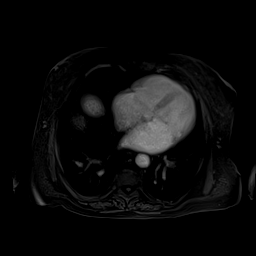

[Series 17: T1 dynamic fat-sat · axial · 3.0mm · 1.56mm/px · z∈[-50,+163]mm · 3 of 72 slices shown (3 of 9)]
[im 1/72]
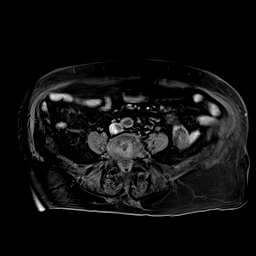
[im 36/72]
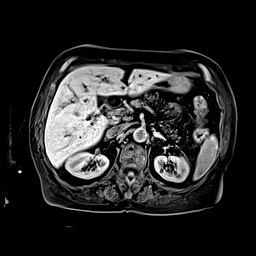
[im 72/72]
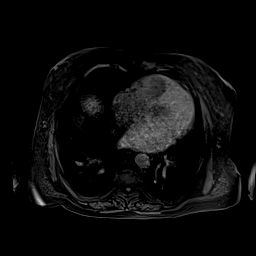

[Series 17: T1 dynamic fat-sat · axial · 3.0mm · 1.56mm/px · z∈[-50,+163]mm · 3 of 72 slices shown (4 of 9)]
[im 1/72]
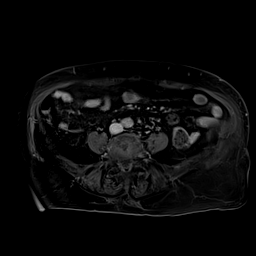
[im 36/72]
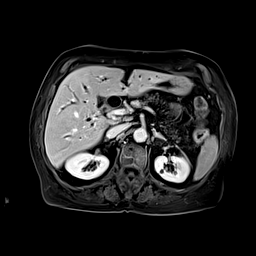
[im 72/72]
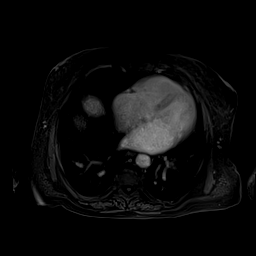

[Series 17: T1 dynamic fat-sat · axial · 3.0mm · 1.56mm/px · z∈[-50,+163]mm · 3 of 72 slices shown (5 of 9)]
[im 1/72]
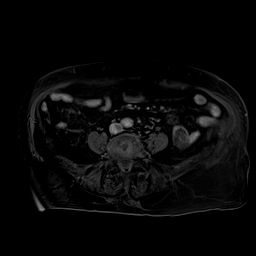
[im 36/72]
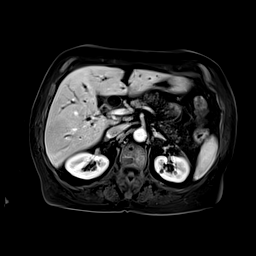
[im 72/72]
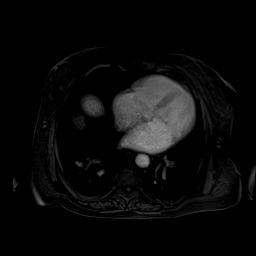

[Series 18: T1 dynamic post-contrast · coronal · 3.0mm · 1.31mm/px · 3 of 80 slices shown]
[im 1/80]
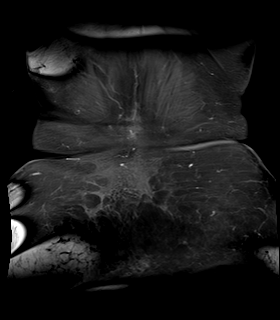
[im 40/80]
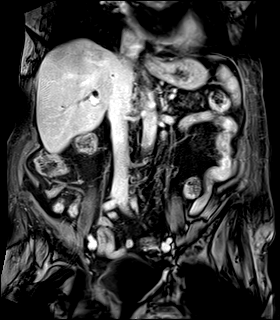
[im 80/80]
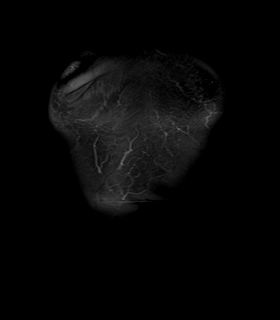

[Series 1019: MRCP · coronal · 0.5mm · 0.49mm/px · 1 of 14 slices shown (1 of 2)]
[im 1/14]
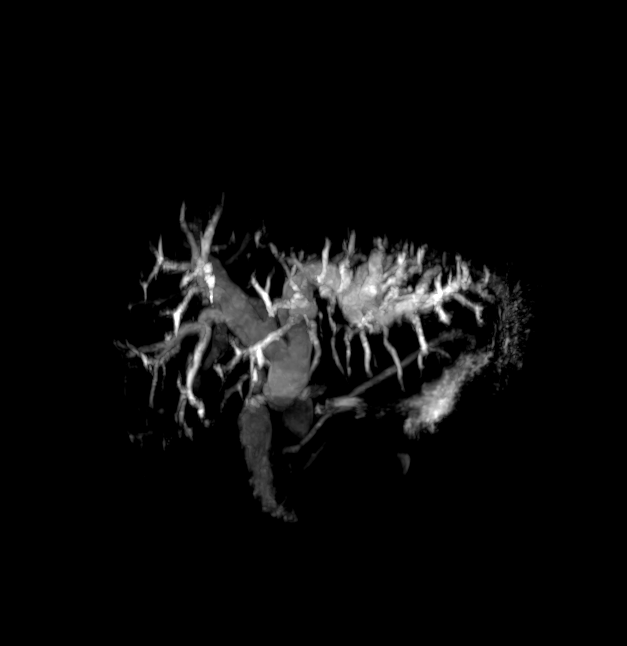

[Series 1025: MRCP · oblique · 0.5mm · 0.49mm/px · 1 of 7 slices shown (2 of 2)]
[im 1/7]
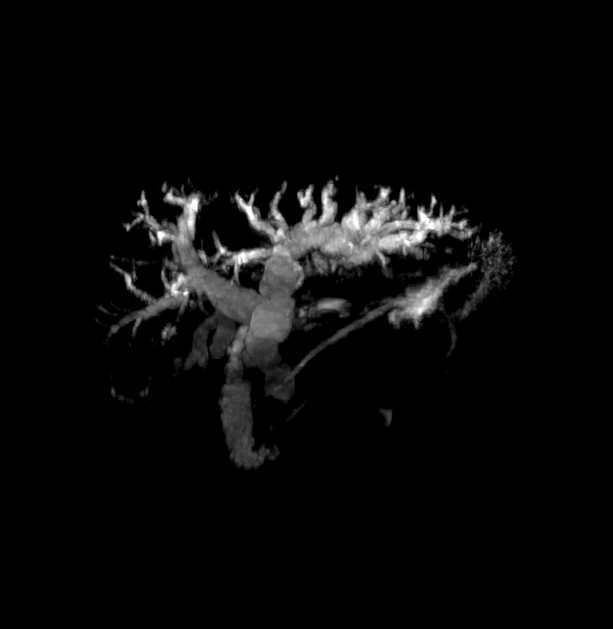

[Series 1032: T1 dynamic fat-sat · axial · 3.0mm · 1.56mm/px · z∈[-50,+163]mm · 3 of 72 slices shown (6 of 9)]
[im 1/72]
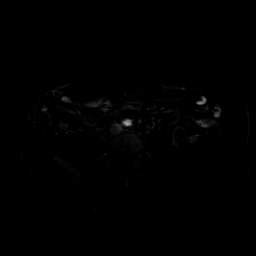
[im 36/72]
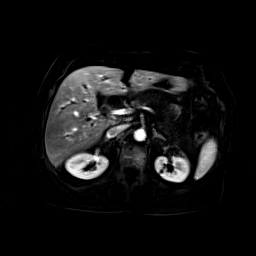
[im 72/72]
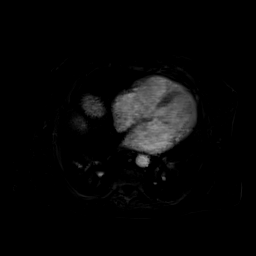

[Series 1032: T1 dynamic fat-sat · axial · 3.0mm · 1.56mm/px · z∈[-50,+163]mm · 3 of 72 slices shown (7 of 9)]
[im 1/72]
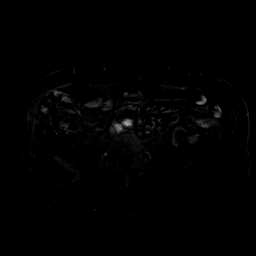
[im 36/72]
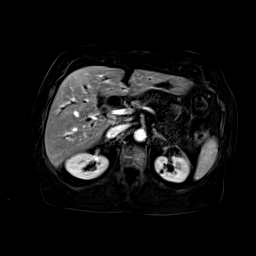
[im 72/72]
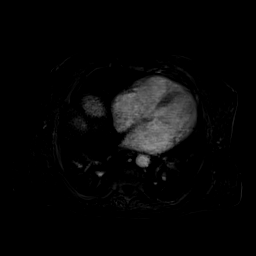

[Series 1032: T1 dynamic fat-sat · axial · 3.0mm · 1.56mm/px · z∈[-50,+163]mm · 3 of 72 slices shown (8 of 9)]
[im 1/72]
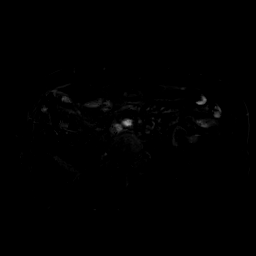
[im 36/72]
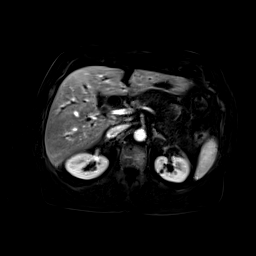
[im 72/72]
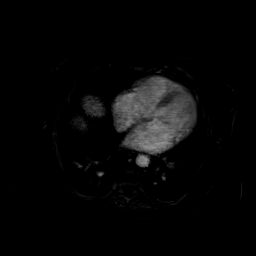

[Series 1032: T1 dynamic fat-sat · axial · 3.0mm · 1.56mm/px · z∈[-50,+163]mm · 3 of 72 slices shown (9 of 9)]
[im 1/72]
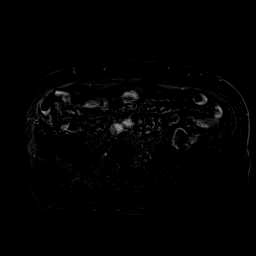
[im 36/72]
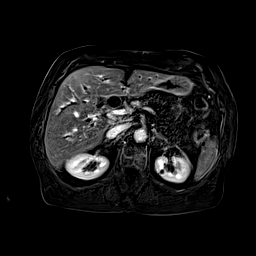
[im 72/72]
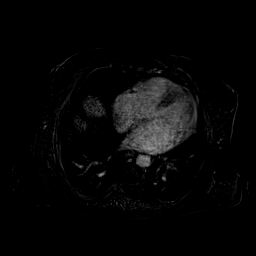

[46 of 48 positions shown; findings below may reference images not displayed]

FINDINGS: Lower chest: Cardiomegaly

Hepatobiliary: There is severe intrahepatic and extrahepatic biliary
dilatation. The common bile duct measures 17 mm. There is a large
stone within the distal common bile duct (series 3, image 15).
Status post cholecystectomy. No focal liver lesion.

Pancreas: No mass, inflammatory changes, or other parenchymal
abnormality identified.

Spleen:  Within normal limits in size and appearance.

Adrenals/Urinary Tract: No masses identified. No evidence of
hydronephrosis.

Stomach/Bowel: Visualized portions within the abdomen are
unremarkable.

Vascular/Lymphatic: No pathologically enlarged lymph nodes
identified. No abdominal aortic aneurysm demonstrated.

Other:  None.

Musculoskeletal: No suspicious bone lesions identified.
IMPRESSION: Severe intrahepatic and extrahepatic biliary dilatation with large
stone within the distal common bile duct.

## 2021-06-18 IMAGING — CT CT HEAD W/O CM
3 series · 15 of 46 positions shown, 18 images · non-contrast
Comparison: 11/01/2009

CLINICAL DATA: Altered level of consciousness, combative, frequent
falls

EXAM:
CT HEAD WITHOUT CONTRAST
CT CERVICAL SPINE WITHOUT CONTRAST
TECHNIQUE: Multidetector CT imaging of the head and cervical spine was
performed following the standard protocol without intravenous
contrast. Multiplanar CT image reconstructions of the cervical spine
were also generated.

[Series 3: head wo · axial · 0.40mm/px · z∈[+410,+530]mm · 9 of 29 slices shown, 12 images]
[im 3/29  brain]
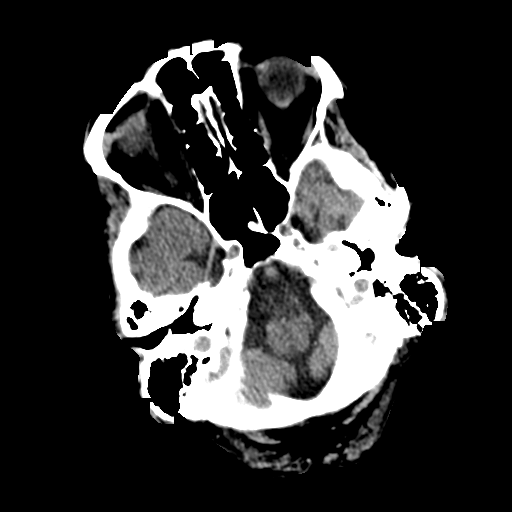
[im 3/29  bone]
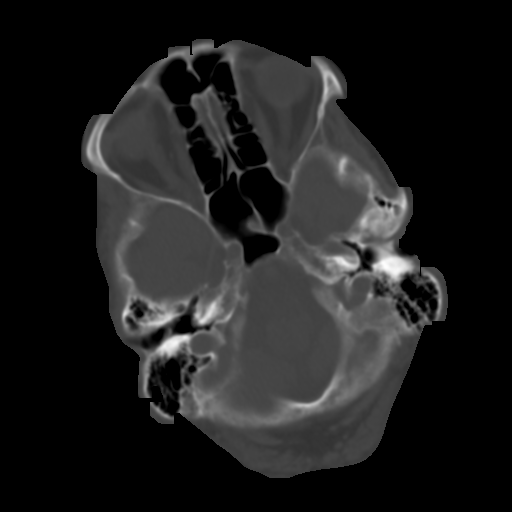
[im 6/29  brain]
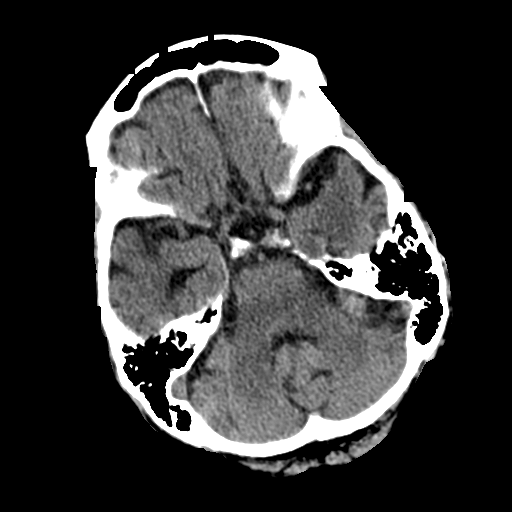
[im 9/29  brain]
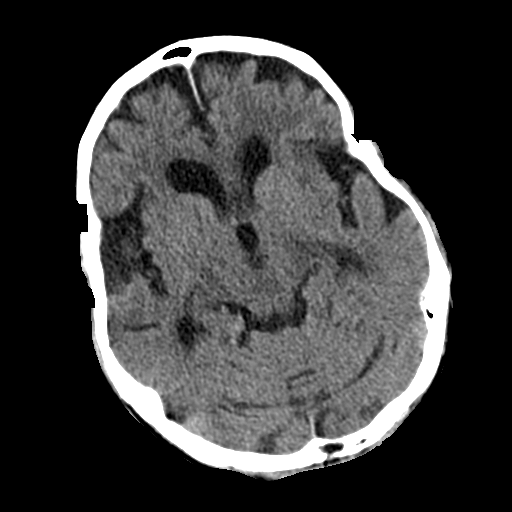
[im 12/29  brain]
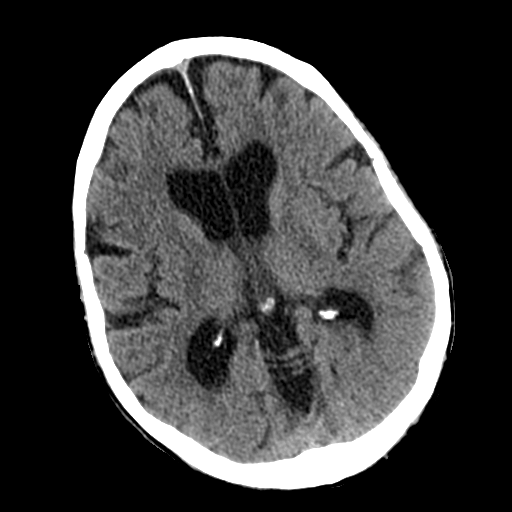
[im 15/29  brain]
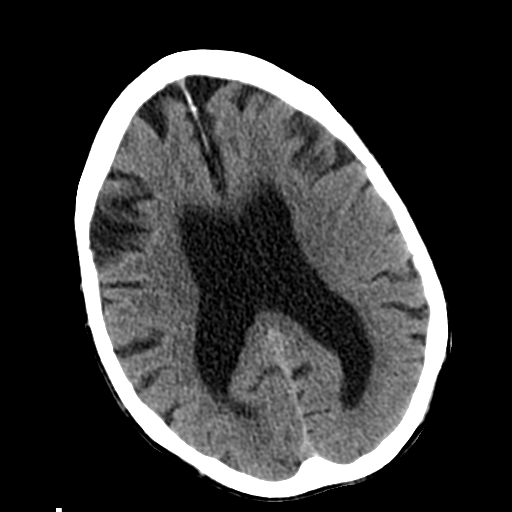
[im 15/29  bone]
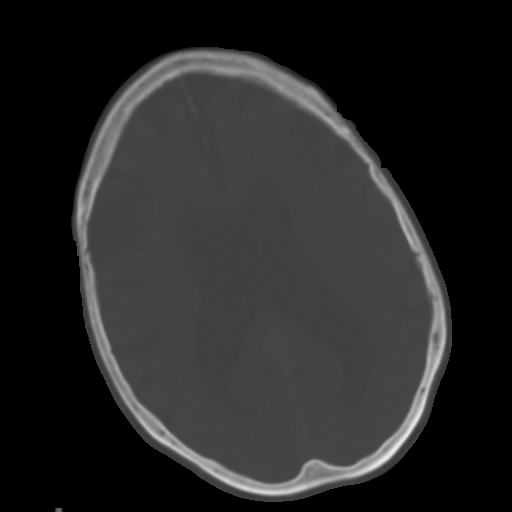
[im 18/29  brain]
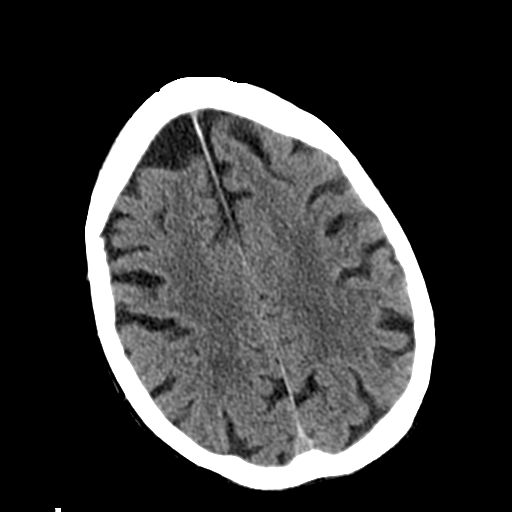
[im 21/29  brain]
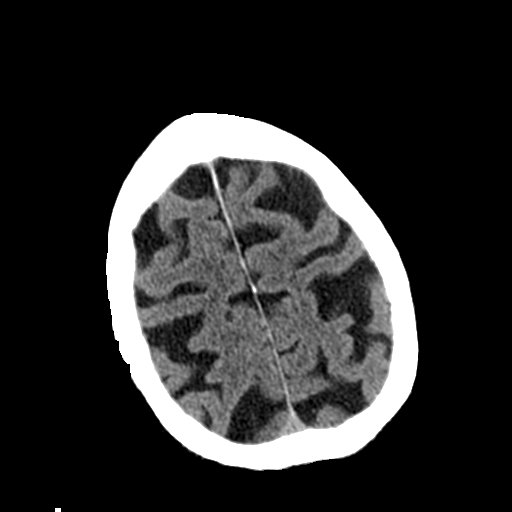
[im 24/29  brain]
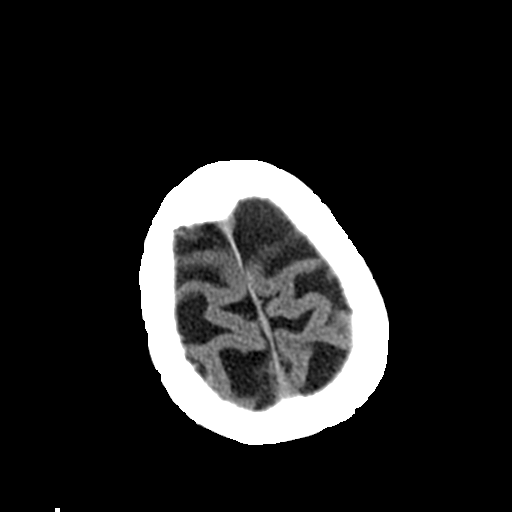
[im 27/29  brain]
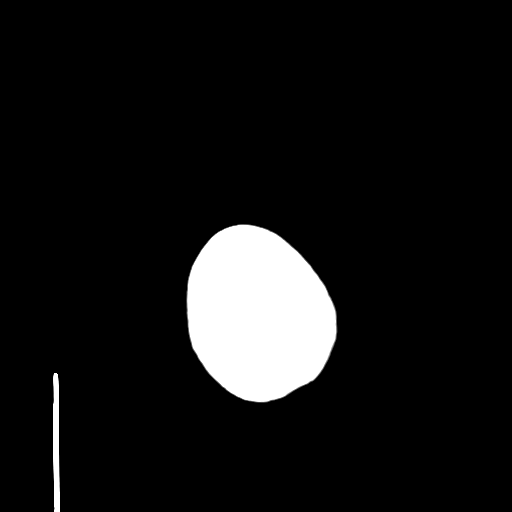
[im 27/29  bone]
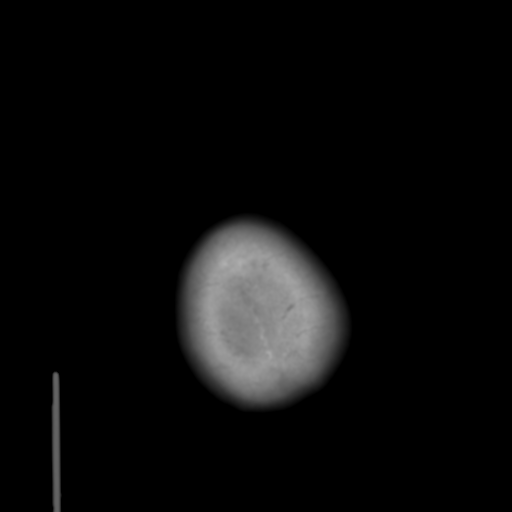

[Series 4: coronal soft tissue · coronal · 0.28mm/px · 3 of 66 slices shown]
[im 22/66  brain]
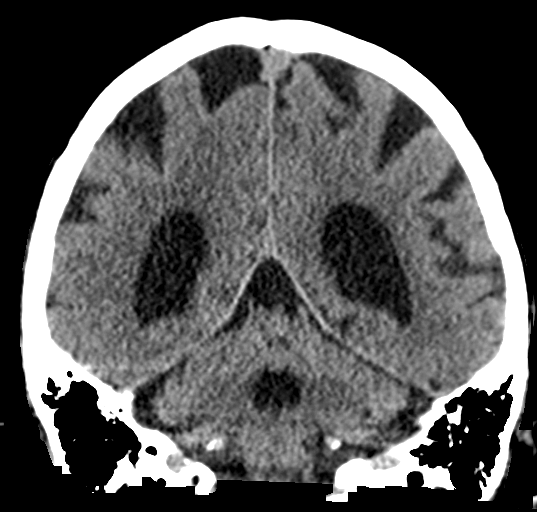
[im 29/66  brain]
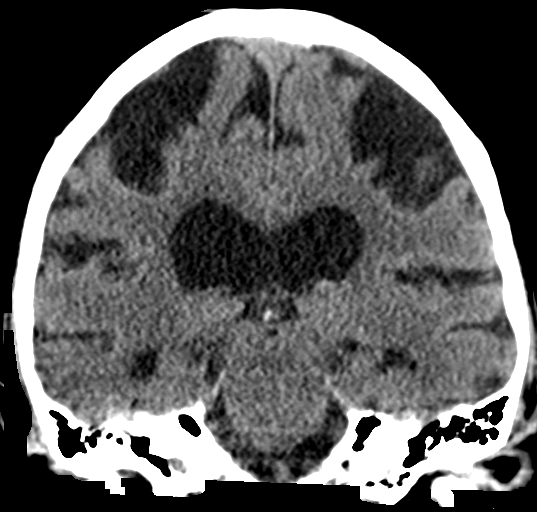
[im 37/66  brain]
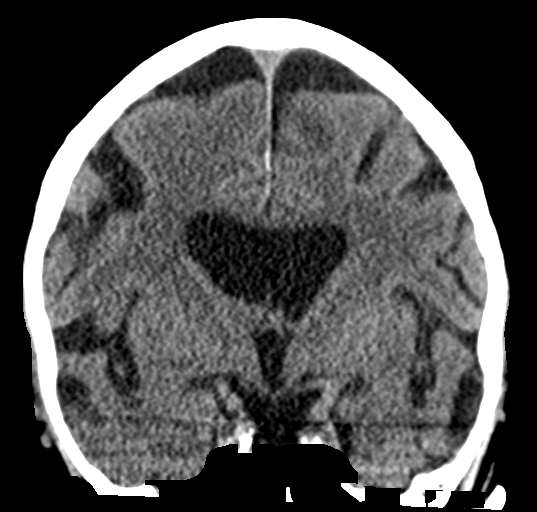

[Series 5: sagittal soft tissue · sagittal · 0.28mm/px · 3 of 50 slices shown]
[im 17/50  brain]
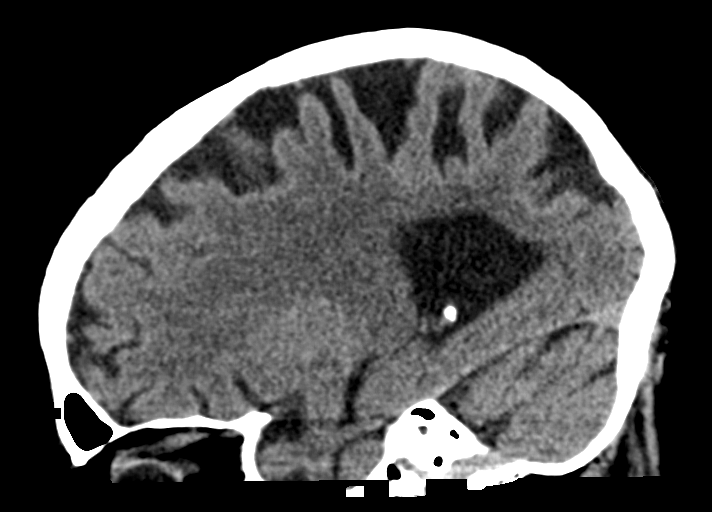
[im 25/50  brain]
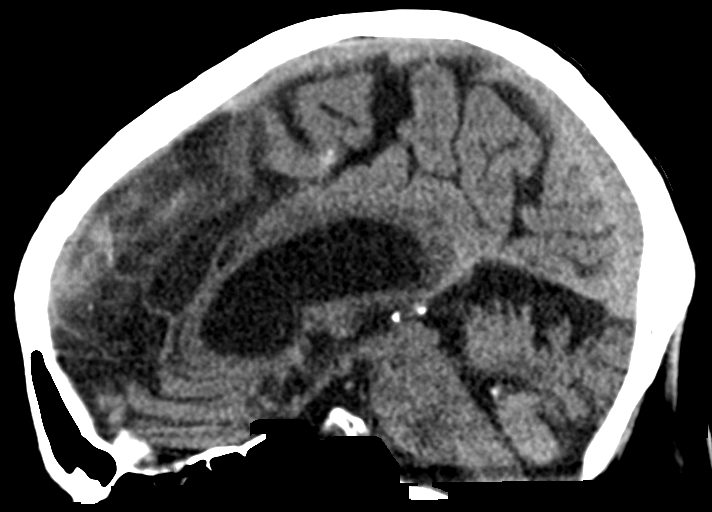
[im 33/50  brain]
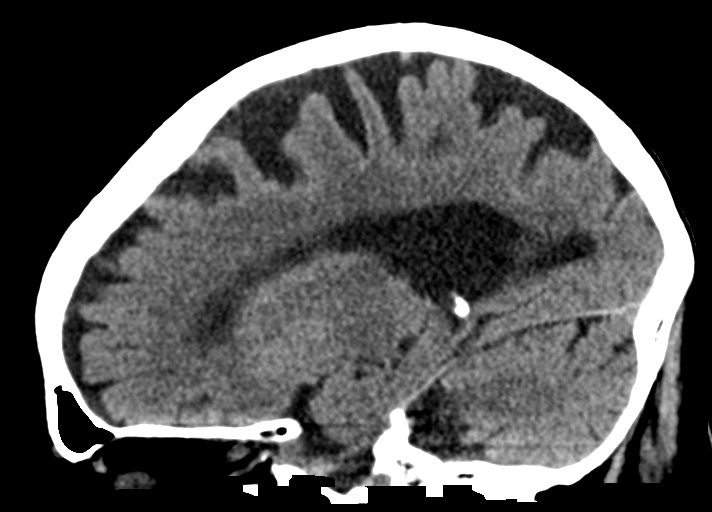

[15 of 46 positions shown; findings below may reference images not displayed]

FINDINGS: CT HEAD FINDINGS

Brain: No acute infarct or hemorrhage. Lateral ventricles and
midline structures are unremarkable. There is moderate diffuse
cerebral atrophy. No acute extra-axial fluid collections. No mass
effect.

Vascular: No hyperdense vessel or unexpected calcification.

Skull: Normal. Negative for fracture or focal lesion.

Sinuses/Orbits: No acute finding.

Other: None.

CT CERVICAL SPINE FINDINGS

Alignment: Alignment is anatomic.

Skull base and vertebrae: No acute displaced fractures.

Soft tissues and spinal canal: No prevertebral fluid or swelling. No
visible canal hematoma.

Disc levels: Mild mid cervical facet hypertrophy. No significant
spondylosis or compressive sequela.

Upper chest: Airway is patent. Lung apices are clear.

Other: Reconstructed images demonstrate no additional findings.
IMPRESSION: 1. No acute intracranial process.
2. No acute cervical spine fracture.

## 2021-08-23 NOTE — Progress Notes (Signed)
MRN : [id number]  Bethany Joseph is a 76 y.o. (15-Feb-1945) female who presents with chief complaint of legs hurt and swell.  History of Present Illness:   Patient is seen for evaluation of leg pain and swelling associated with new onset ulceration. The patient first noticed the swelling remotely. The swelling is associated with pain and discoloration. The pain and swelling worsens with prolonged dependency and improves with elevation. The pain is unrelated to activity.  The patient notes that in the morning the legs are better but the leg symptoms worsened throughout the course of the day. The patient has also noted a progressive worsening of the discoloration in the ankle and shin area.   The patient notes that an ulcer has developed acutely without specific trauma and since it occurred it has been very slow to heal.  There is a moderate amount of drainage associated with the open area.  The wound is also very painful.  The patient denies claudication symptoms or rest pain symptoms.   The patient has not had any past angiography, interventions or vascular surgery.  Elevation makes the leg symptoms better, dependency makes them much worse. The patient denies any recent changes in medications.  The patient has not been wearing graduated compression.  The patient denies a history of DVT or PE. There is no prior history of phlebitis. There is no history of primary lymphedema.  No history of malignancies. No history of trauma or groin or pelvic surgery. There is no history of radiation treatment to the groin or pelvis       No outpatient medications have been marked as taking for the 08/24/21 encounter (Appointment) with Gilda Crease, Latina Craver, MD.    Past Medical History:  Diagnosis Date   Acquired hypothyroidism    Diagnosed with peripheral resistance   Allergic rhinitis    Aortic valve disorder    Arrhythmia    Arthritis    Asthma    Asthma without status  asthmaticus    unspecified   At risk for falls    uses cane, arthritis   Atrial fibrillation (HCC)    Permanent at this time CHADSVASc=3 (age 61, DM, female) 05/30/09 - Afib at 107 4/25 A fib 88 with better rate control on Multaq, but dc Multaq since in permanent A Fib, refuses Coumadin therapy    Avascular necrosis (HCC)    left foot   Diabetes (HCC)    Diabetic neuropathy (HCC)    unspecified   Difficult intubation    has a difficult intubation card   Fracture of distal femur (HCC)    left periprosthetic, status post ORIF   GERD (gastroesophageal reflux disease)    Gout    Heart murmur    History of atrial fibrillation    History of chicken pox    Humerus distal fracture    left - [place] on 05/21/12   Hyperkeratosis    Pre-ulcerative hyperkeratotic lesions first MTPJs   Hyperlipidemia    Hypertension    Hyperthyroidism    Mycotic toenails    Osteoarthritis    Osteoporosis, post-menopausal    Status post bilateral knee replacement. DEXA 09/2009: t=-1.6 spine, t=-3.1 right fem neck   Paroxysmal tachycardia (HCC)    now in chronic A fib but rate is well controlled   Type II diabetes mellitus (HCC)     Past Surgical History:  Procedure Laterality Date   CHOLECYSTECTOMY  ENDOSCOPIC RETROGRADE CHOLANGIOPANCREATOGRAPHY (ERCP) WITH PROPOFOL N/A 08/25/2019   Procedure: ENDOSCOPIC RETROGRADE CHOLANGIOPANCREATOGRAPHY (ERCP) WITH PROPOFOL;  Surgeon: Midge Minium, MD;  Location: [facility] ENDOSCOPY;  Service: Endoscopy;  Laterality: N/A;   ERCP N/A 11/03/2019   Procedure: ENDOSCOPIC RETROGRADE CHOLANGIOPANCREATOGRAPHY (ERCP);  Surgeon: Midge Minium, MD;  Location: [facility] ENDOSCOPY;  Service: Endoscopy;  Laterality: N/A;   FEMUR FRACTURE SURGERY Left 2010   INTRAMEDULLARY (IM) NAIL INTERTROCHANTERIC Right 07/15/2017   Procedure: INTRAMEDULLARY (IM) NAIL INTERTROCHANTRIC;  Surgeon: Kennedy Bucker, MD;  Location: [facility] ORS;  Service: Orthopedics;  Laterality: Right;   JOINT REPLACEMENT  2007   knee  replacement surgeries   ORIF FIBULA FRACTURE     ORIF HUMERUS FRACTURE Left 05/28/2012   supracondylar distal   REPLACEMENT TOTAL KNEE BILATERAL     THYROIDECTOMY  1975   [place]   TOTAL ABDOMINAL HYSTERECTOMY W/ BILATERAL SALPINGOOPHORECTOMY  1983   [place]   TOTAL KNEE ARTHROPLASTY Bilateral     Social History Social History   Tobacco Use   Smoking status: Former    Types: Cigarettes    Quit date: 02/13/1979    Years since quitting: 42.5   Smokeless tobacco: Never  Vaping Use   Vaping Use: Never used  Substance Use Topics   Alcohol use: Yes    Alcohol/week: 14.0 standard drinks of alcohol    Types: 14 Glasses of wine per week   Drug use: No    Family History Family History  Problem Relation Age of Onset   Colon cancer Mother    Osteoporosis Mother    Arthritis Mother    Early death Mother    Diabetes Father    Hypertension Father    Heart attack Father    Coronary artery disease Father    Early death Father    Heart disease Father    Hyperlipidemia Father    Depression Brother    Diabetes Brother    Mental illness Brother    Asthma Maternal Grandfather    COPD Maternal Grandfather    Asthma Maternal Aunt    COPD Maternal Aunt    Cancer Maternal Aunt     Allergies  Allergen Reactions   Neosporin [Neomycin-Polymyxin-Gramicidin] Hives   Tape     Other reaction(s): Unknown Adhesive bandage   Codeine Rash   Latex Rash   Neomycin-Bacitracin Zn-Polymyx Rash    Other reaction(s): Unknown DERMATOLOGICALS    Penicillins Rash and Other (See Comments)    Has patient had a PCN reaction causing immediate rash, facial/tongue/throat swelling, SOB or lightheadedness with hypotension: Unknown Has patient had a PCN reaction causing severe rash involving mucus membranes or skin necrosis: Unknown Has patient had a PCN reaction that required hospitalization: Unknown Has patient had a PCN reaction occurring within the last 10 years: No If all of the above answers are "NO",  then may proceed with Cephalosporin use.      REVIEW OF SYSTEMS (Negative unless checked)  Constitutional: [] Weight loss  [] Fever  [] Chills Cardiac: [] Chest pain   [] Chest pressure   [] Palpitations   [] Shortness of breath when laying flat   [] Shortness of breath with exertion. Vascular:  [] Pain in legs with walking   [x] Pain in legs at rest  [] History of DVT   [] Phlebitis   [x] Swelling in legs   [] Varicose veins   [] Non-healing ulcers Pulmonary:   [] Uses home oxygen   [] Productive cough   [] Hemoptysis   [] Wheeze  [x] COPD   [x] Asthma Neurologic:  [] Dizziness   [] Seizures   [] History of  stroke   [] History of TIA  [] Aphasia   [] Vissual changes   [] Weakness or numbness in arm   [] Weakness or numbness in leg Musculoskeletal:   [] Joint swelling   [] Joint pain   [] Low back pain Hematologic:  [] Easy bruising  [] Easy bleeding   [] Hypercoagulable state   [] Anemic Gastrointestinal:  [] Diarrhea   [] Vomiting  [x] Gastroesophageal reflux/heartburn   [] Difficulty swallowing. Genitourinary:  [] Chronic kidney disease   [] Difficult urination  [] Frequent urination   [] Blood in urine Skin:  [] Rashes   [] Ulcers  Psychological:  [] History of anxiety   []  History of major depression.  Physical Examination  There were no vitals filed for this visit. There is no height or weight on file to calculate BMI. Gen: WD/WN, NAD Head: [place]/AT, No temporalis wasting.  Ear/Nose/Throat: Hearing grossly intact, nares w/o erythema or drainage, pinna without lesions Eyes: PER, EOMI, sclera nonicteric.  Neck: Supple, no gross masses.  No JVD.  Pulmonary:  Good air movement, no audible wheezing, no use of accessory muscles.  Cardiac: RRR, precordium not hyperdynamic. Vascular:  2-3+ edema of the right leg with severe venous changes of the right leg.  Venous ulcer noted in the ankle area on the right, noninfected  Vessel Right Left  Radial Palpable Palpable  Gastrointestinal: soft, non-distended. No guarding/no peritoneal signs.   Musculoskeletal: M/S 5/5 throughout.  No deformity.  Neurologic: CN 2-12 intact. Pain and light touch intact in extremities.  Symmetrical.  Speech is fluent. Motor exam as listed above. Psychiatric: Judgment intact, Mood & affect appropriate for pt's clinical situation. Dermatologic: Venous stasis dermatitis with ulcers present on the right.  No changes consistent with cellulitis. Lymph : No lichenification or skin changes of chronic lymphedema.  CBC Lab Results  Component Value Date   WBC 9.6 02/07/2020   HGB 8.0 (L) 02/07/2020   HCT 25.7 (L) 02/07/2020   MCV 86.5 02/07/2020   PLT 224 02/07/2020    BMET    Component Value Date/Time   NA 137 02/07/2020 0543   NA 136 05/27/2012 0917   K 3.4 (L) 02/07/2020 0543   K 4.0 05/27/2012 0917   CL 104 02/07/2020 0543   CL 102 05/27/2012 0917   CO2 24 02/07/2020 0543   CO2 28 05/27/2012 0917   GLUCOSE 155 (H) 02/07/2020 0543   GLUCOSE 140 (H) 05/27/2012 0917   BUN 22 02/07/2020 0543   BUN 13 05/27/2012 0917   CREATININE 0.67 02/07/2020 0543   CREATININE 0.58 (L) 05/27/2012 0917   CALCIUM 8.7 (L) 02/07/2020 0543   CALCIUM 9.3 05/27/2012 0917   GFRNONAA >60 02/07/2020 0543   GFRNONAA >60 05/27/2012 0917   GFRAA >60 08/27/2019 0718   GFRAA >60 05/27/2012 0917   CrCl cannot be calculated (Patient's most recent lab result is older than the maximum 21 days allowed.).  COAG Lab Results  Component Value Date   INR 1.3 (H) 02/02/2020   INR 1.2 08/23/2019   INR 1.08 07/19/2016    Radiology No results found.   Assessment/Plan 1. Venous ulcer of ankle, right (HCC) No surgery or intervention at this point in time.    I have had a long discussion with the patient regarding venous insufficiency and why it  causes symptoms, specifically venous ulceration. I have discussed with the patient the chronic skin changes that accompany venous insufficiency and the long term sequela such as infection and recurring  ulceration.  Patient will  be placed in 02/09/2020 which will be changed weekly drainage permitting.  In addition, behavioral modification including several periods of elevation of the lower extremities during the day will be continued. Achieving a position with the ankles at heart level was stressed to the patient  The patient is instructed to begin routine exercise, especially walking on a daily basis  In the future the patient can be assessed for graduated compression stockings or wraps as well as a Lymph Pump once the ulcers are healed.   2. Chronic venous insufficiency No surgery or intervention at this point in time.    I have had a long discussion with the patient regarding venous insufficiency and why it  causes symptoms, specifically venous ulceration. I have discussed with the patient the chronic skin changes that accompany venous insufficiency and the long term sequela such as infection and recurring  ulceration.  Patient will be placed in [employer] which will be changed weekly drainage permitting.  In addition, behavioral modification including several periods of elevation of the lower extremities during the day will be continued. Achieving a position with the ankles at heart level was stressed to the patient  The patient is instructed to begin routine exercise, especially walking on a daily basis  In the future the patient can be assessed for graduated compression stockings or wraps as well as a Lymph Pump once the ulcers are healed.   3. Lymphedema No surgery or intervention at this point in time.    I have had a long discussion with the patient regarding venous insufficiency and why it  causes symptoms, specifically venous ulceration. I have discussed with the patient the chronic skin changes that accompany venous insufficiency and the long term sequela such as infection and recurring  ulceration.  Patient will be placed in [employer] which will be changed weekly drainage permitting.  In addition,  behavioral modification including several periods of elevation of the lower extremities during the day will be continued. Achieving a position with the ankles at heart level was stressed to the patient  The patient is instructed to begin routine exercise, especially walking on a daily basis  In the future the patient can be assessed for graduated compression stockings or wraps as well as a Lymph Pump once the ulcers are healed.   4. Essential hypertension Continue antihypertensive medications as already ordered, these medications have been reviewed and there are no changes at this time.   5. Persistent atrial fibrillation (HCC) Continue antiarrhythmia medications as already ordered, these medications have been reviewed and there are no changes at this time.  Continue anticoagulation as ordered by Cardiology Service   6. Pulmonary emphysema, unspecified emphysema type (HCC) Continue pulmonary medications and aerosols as already ordered, these medications have been reviewed and there are no changes at this time.      Levora Dredge, MD  08/23/2021 5:16 PM

## 2021-08-24 ENCOUNTER — Encounter (INDEPENDENT_AMBULATORY_CARE_PROVIDER_SITE_OTHER): Payer: Self-pay | Admitting: Vascular Surgery

## 2021-08-24 ENCOUNTER — Ambulatory Visit (INDEPENDENT_AMBULATORY_CARE_PROVIDER_SITE_OTHER): Payer: Medicare Other | Admitting: Vascular Surgery

## 2021-08-24 VITALS — BP 132/65 | HR 93 | Resp 16

## 2021-08-24 DIAGNOSIS — I83013 Varicose veins of right lower extremity with ulcer of ankle: Secondary | ICD-10-CM

## 2021-08-24 DIAGNOSIS — I890 Lymphedema, not elsewhere classified: Secondary | ICD-10-CM

## 2021-08-24 DIAGNOSIS — J439 Emphysema, unspecified: Secondary | ICD-10-CM

## 2021-08-24 DIAGNOSIS — I10 Essential (primary) hypertension: Secondary | ICD-10-CM | POA: Diagnosis not present

## 2021-08-24 DIAGNOSIS — I4819 Other persistent atrial fibrillation: Secondary | ICD-10-CM

## 2021-08-24 DIAGNOSIS — I872 Venous insufficiency (chronic) (peripheral): Secondary | ICD-10-CM | POA: Diagnosis not present

## 2021-08-24 DIAGNOSIS — L97319 Non-pressure chronic ulcer of right ankle with unspecified severity: Secondary | ICD-10-CM

## 2021-08-25 ENCOUNTER — Encounter (INDEPENDENT_AMBULATORY_CARE_PROVIDER_SITE_OTHER): Payer: Self-pay | Admitting: Vascular Surgery

## 2021-08-25 DIAGNOSIS — I83013 Varicose veins of right lower extremity with ulcer of ankle: Secondary | ICD-10-CM | POA: Insufficient documentation

## 2021-08-31 ENCOUNTER — Encounter (INDEPENDENT_AMBULATORY_CARE_PROVIDER_SITE_OTHER): Payer: Self-pay | Admitting: Vascular Surgery

## 2021-08-31 ENCOUNTER — Ambulatory Visit (INDEPENDENT_AMBULATORY_CARE_PROVIDER_SITE_OTHER): Payer: Medicare Other | Admitting: Vascular Surgery

## 2021-08-31 VITALS — BP 111/67 | HR 75 | Resp 17

## 2021-08-31 DIAGNOSIS — I83003 Varicose veins of unspecified lower extremity with ulcer of ankle: Secondary | ICD-10-CM | POA: Diagnosis not present

## 2021-08-31 DIAGNOSIS — L97301 Non-pressure chronic ulcer of unspecified ankle limited to breakdown of skin: Secondary | ICD-10-CM

## 2021-08-31 DIAGNOSIS — J439 Emphysema, unspecified: Secondary | ICD-10-CM

## 2021-08-31 DIAGNOSIS — I10 Essential (primary) hypertension: Secondary | ICD-10-CM | POA: Diagnosis not present

## 2021-08-31 DIAGNOSIS — I872 Venous insufficiency (chronic) (peripheral): Secondary | ICD-10-CM

## 2021-08-31 DIAGNOSIS — I4819 Other persistent atrial fibrillation: Secondary | ICD-10-CM

## 2021-09-03 ENCOUNTER — Encounter (INDEPENDENT_AMBULATORY_CARE_PROVIDER_SITE_OTHER): Payer: Self-pay | Admitting: Vascular Surgery

## 2021-09-03 NOTE — Progress Notes (Signed)
MRN : [id number]  Bethany Joseph is a 76 y.o. (08-Oct-1945) female who presents with chief complaint of legs hurt and swell.  History of Present Illness:   Patient is seen for follow up evaluation of leg pain and swelling associated with venous ulceration. The patient was recently seen here and started on Unna boot therapy.    The patient notes that an ulcer developed acutely without specific trauma and since it occurred it has been very slow to heal.  There was a moderate amount of drainage associated with the open area.  The wound is also painful.  The patient states that they have been elevating as much as possible. The patient denies any recent changes in medications.  The patient denies a history of DVT or PE. There is no prior history of phlebitis. There is no history of primary lymphedema.  No SOB or increased cough.  No sputum production.  No recent episodes of CHF exacerbation.      Current Meds  Medication Sig   albuterol (PROVENTIL) (2.5 MG/3ML) 0.083% nebulizer solution Inhale into the lungs.   Bacillus Coagulans-Inulin (ALIGN PREBIOTIC-PROBIOTIC PO) Take 1 capsule by mouth daily.   Cholecalciferol 4000 units CAPS Take 1 capsule by mouth daily.   doxepin (SINEQUAN) 10 MG capsule Take 10 mg by mouth at bedtime.   ELIQUIS 5 MG TABS tablet Take 5 mg by mouth every 12 (twelve) hours.   ferrous sulfate 325 (65 FE) MG EC tablet Take by mouth.   gabapentin (NEURONTIN) 100 MG capsule Take 100 mg by mouth daily.   gabapentin (NEURONTIN) 400 MG capsule Take 400 mg by mouth at bedtime.   HYDROcodone-acetaminophen (NORCO/VICODIN) 5-325 MG tablet Take 1 tablet by mouth every 4 (four) hours as needed.   Lactobacillus (FLORAJEN ACIDOPHILUS PO) Take by mouth.   levothyroxine (SYNTHROID, LEVOTHROID) 25 MCG tablet Take 25 mcg by mouth daily before breakfast.   liothyronine (CYTOMEL) 25 MCG tablet Take 75 mcg by mouth daily. 4 tabs   loratadine (CLARITIN) 10 MG tablet  Take 10 mg by mouth daily.   losartan (COZAAR) 50 MG tablet Take 50 mg by mouth daily.   Melatonin 10 MG TABS Take 1 tablet by mouth at bedtime as needed.   metFORMIN (GLUCOPHAGE-XR) 500 MG 24 hr tablet Take 1,000 mg by mouth every evening.   Omega 3 1200 MG CAPS Take 1,200 mg by mouth daily.   omeprazole (PRILOSEC) 40 MG capsule TAKE ONE CAPSULE BY MOUTH DAILY   potassium chloride SA (KLOR-CON) 20 MEQ tablet Take 20 mEq by mouth daily.   pravastatin (PRAVACHOL) 20 MG tablet Take 20 mg by mouth.   torsemide (DEMADEX) 20 MG tablet Take 40 mg by mouth 2 (two) times daily.   vitamin E 1000 UNIT capsule Take by mouth.   zolpidem (AMBIEN) 5 MG tablet Take 5 mg by mouth at bedtime as needed for sleep.    Past Medical History:  Diagnosis Date   Acquired hypothyroidism    Diagnosed with peripheral resistance   Allergic rhinitis    Aortic valve disorder    Arrhythmia    Arthritis    Asthma    Asthma without status asthmaticus    unspecified   At risk for falls    uses cane, arthritis   Atrial fibrillation (HCC)    Permanent at this time CHADSVASc=3 (age 76, DM, female) 05/30/09 - Afib at 107 4/25 A fib 88 with better rate control  on Multaq, but dc Multaq since in permanent A Fib, refuses Coumadin therapy    Avascular necrosis (HCC)    left foot   Diabetes (HCC)    Diabetic neuropathy (HCC)    unspecified   Difficult intubation    has a difficult intubation card   Fracture of distal femur (HCC)    left periprosthetic, status post ORIF   GERD (gastroesophageal reflux disease)    Gout    Heart murmur    History of atrial fibrillation    History of chicken pox    Humerus distal fracture    left - inury on 05/21/12   Hyperkeratosis    Pre-ulcerative hyperkeratotic lesions first MTPJs   Hyperlipidemia    Hypertension    Hyperthyroidism    Mycotic toenails    Osteoarthritis    Osteoporosis, post-menopausal    Status post bilateral knee replacement. DEXA 09/2009: t=-1.6 spine, t=-3.1  right fem neck   Paroxysmal tachycardia (HCC)    now in chronic A fib but rate is well controlled   Type II diabetes mellitus (HCC)     Past Surgical History:  Procedure Laterality Date   CHOLECYSTECTOMY     ENDOSCOPIC RETROGRADE CHOLANGIOPANCREATOGRAPHY (ERCP) WITH PROPOFOL N/A 08/25/2019   Procedure: ENDOSCOPIC RETROGRADE CHOLANGIOPANCREATOGRAPHY (ERCP) WITH PROPOFOL;  Surgeon: Midge Minium, MD;  Location: [facility] ENDOSCOPY;  Service: Endoscopy;  Laterality: N/A;   ERCP N/A 11/03/2019   Procedure: ENDOSCOPIC RETROGRADE CHOLANGIOPANCREATOGRAPHY (ERCP);  Surgeon: Midge Minium, MD;  Location: [facility] ENDOSCOPY;  Service: Endoscopy;  Laterality: N/A;   FEMUR FRACTURE SURGERY Left 2010   INTRAMEDULLARY (IM) NAIL INTERTROCHANTERIC Right 07/15/2017   Procedure: INTRAMEDULLARY (IM) NAIL INTERTROCHANTRIC;  Surgeon: Kennedy Bucker, MD;  Location: [facility] ORS;  Service: Orthopedics;  Laterality: Right;   JOINT REPLACEMENT  2007   knee replacement surgeries   ORIF FIBULA FRACTURE     ORIF HUMERUS FRACTURE Left 05/28/2012   supracondylar distal   REPLACEMENT TOTAL KNEE BILATERAL     THYROIDECTOMY  1975   [place]   TOTAL ABDOMINAL HYSTERECTOMY W/ BILATERAL SALPINGOOPHORECTOMY  1983   [place]   TOTAL KNEE ARTHROPLASTY Bilateral     Social History Social History   Tobacco Use   Smoking status: Former    Types: Cigarettes    Quit date: 02/13/1979    Years since quitting: 42.5   Smokeless tobacco: Never  Vaping Use   Vaping Use: Never used  Substance Use Topics   Alcohol use: Yes    Alcohol/week: 14.0 standard drinks of alcohol    Types: 14 Glasses of wine per week   Drug use: No    Family History Family History  Problem Relation Age of Onset   Colon cancer Mother    Osteoporosis Mother    Arthritis Mother    Early death Mother    Diabetes Father    Hypertension Father    Heart attack Father    Coronary artery disease Father    Early death Father    Heart disease Father    Hyperlipidemia Father     Depression Brother    Diabetes Brother    Mental illness Brother    Asthma Maternal Grandfather    COPD Maternal Grandfather    Asthma Maternal Aunt    COPD Maternal Aunt    Cancer Maternal Aunt     Allergies  Allergen Reactions   Neosporin [Neomycin-Polymyxin-Gramicidin] Hives   Tape     Other reaction(s): Unknown Adhesive bandage   Codeine Rash   Latex Rash  Neomycin-Bacitracin Zn-Polymyx Rash    Other reaction(s): Unknown DERMATOLOGICALS    Penicillins Rash and Other (See Comments)    Has patient had a PCN reaction causing immediate rash, facial/tongue/throat swelling, SOB or lightheadedness with hypotension: Unknown Has patient had a PCN reaction causing severe rash involving mucus membranes or skin necrosis: Unknown Has patient had a PCN reaction that required hospitalization: Unknown Has patient had a PCN reaction occurring within the last 10 years: No If all of the above answers are "NO", then may proceed with Cephalosporin use.    Silicone Rash    bandages     REVIEW OF SYSTEMS (Negative unless checked)  Constitutional: [] Weight loss  [] Fever  [] Chills Cardiac: [] Chest pain   [] Chest pressure   [] Palpitations   [] Shortness of breath when laying flat   [] Shortness of breath with exertion. Vascular:  [] Pain in legs with walking   [x] Pain in legs at rest  [] History of DVT   [] Phlebitis   [x] Swelling in legs   [] Varicose veins   [] Non-healing ulcers Pulmonary:   [] Uses home oxygen   [] Productive cough   [] Hemoptysis   [] Wheeze  [x] COPD   [] Asthma Neurologic:  [] Dizziness   [] Seizures   [] History of stroke   [] History of TIA  [] Aphasia   [] Vissual changes   [] Weakness or numbness in arm   [] Weakness or numbness in leg Musculoskeletal:   [] Joint swelling   [] Joint pain   [] Low back pain Hematologic:  [] Easy bruising  [] Easy bleeding   [] Hypercoagulable state   [] Anemic Gastrointestinal:  [] Diarrhea   [] Vomiting  [] Gastroesophageal reflux/heartburn   [] Difficulty  swallowing. Genitourinary:  [] Chronic kidney disease   [] Difficult urination  [] Frequent urination   [] Blood in urine Skin:  [] Rashes   [] Ulcers  Psychological:  [] History of anxiety   []  History of major depression.  Physical Examination  Vitals:   08/31/21 1431  BP: 111/67  Pulse: 75  Resp: 17   There is no height or weight on file to calculate BMI. Gen: WD/WN, NAD Head: [place]/AT, No temporalis wasting.  Ear/Nose/Throat: Hearing grossly intact, nares w/o erythema or drainage, pinna without lesions Eyes: PER, EOMI, sclera nonicteric.  Neck: Supple, no gross masses.  No JVD.  Pulmonary:  Good air movement, no audible wheezing, no use of accessory muscles.  Cardiac: RRR, precordium not hyperdynamic. Vascular:  2+ soft pitting edema  ulcer is much improved.  Moderate venous stasis changes to the legs bilaterally.   Vessel Right Left  Radial Palpable Palpable  Gastrointestinal: soft, non-distended. No guarding/no peritoneal signs.  Musculoskeletal: M/S 5/5 throughout.  No deformity.  Neurologic: CN 2-12 intact. Pain and light touch intact in extremities.  Symmetrical.  Speech is fluent. Motor exam as listed above. Psychiatric: Judgment intact, Mood & affect appropriate for pt's clinical situation. Dermatologic: Venous rashes no ulcers noted.  No changes consistent with cellulitis. Lymph : No lichenification or skin changes of chronic lymphedema.  CBC Lab Results  Component Value Date   WBC 9.6 02/07/2020   HGB 8.0 (L) 02/07/2020   HCT 25.7 (L) 02/07/2020   MCV 86.5 02/07/2020   PLT 224 02/07/2020    BMET    Component Value Date/Time   NA 137 02/07/2020 0543   NA 136 05/27/2012 0917   K 3.4 (L) 02/07/2020 0543   K 4.0 05/27/2012 0917   CL 104 02/07/2020 0543   CL 102 05/27/2012 0917   CO2 24 02/07/2020 0543   CO2 28 05/27/2012 0917   GLUCOSE 155 (H) 02/07/2020  0543   GLUCOSE 140 (H) 05/27/2012 0917   BUN 22 02/07/2020 0543   BUN 13 05/27/2012 0917   CREATININE 0.67  02/07/2020 0543   CREATININE 0.58 (L) 05/27/2012 0917   CALCIUM 8.7 (L) 02/07/2020 0543   CALCIUM 9.3 05/27/2012 0917   GFRNONAA >60 02/07/2020 0543   GFRNONAA >60 05/27/2012 0917   GFRAA >60 08/27/2019 0718   GFRAA >60 05/27/2012 0917   CrCl cannot be calculated (Patient's most recent lab result is older than the maximum 21 days allowed.).  COAG Lab Results  Component Value Date   INR 1.3 (H) 02/02/2020   INR 1.2 08/23/2019   INR 1.08 07/19/2016    Radiology No results found.   Assessment/Plan 1. 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    I have had a long discussion with the patient regarding venous insufficiency and why it  causes symptoms, specifically venous ulceration. I have discussed with the patient the chronic skin changes that accompany venous insufficiency and the long term sequela such as infection and recurring  ulceration.  Patient will be placed in [employer] which will be changed weekly drainage permitting.  In addition, behavioral modification including several periods of elevation of the lower extremities during the day will be continued. Achieving a position with the ankles at heart level was stressed to the patient  The patient is instructed to begin routine exercise, especially walking on a daily basis  2. Chronic venous insufficiency No surgery or intervention at this point in time.    I have had a long discussion with the patient regarding venous insufficiency and why it  causes symptoms, specifically venous ulceration. I have discussed with the patient the chronic skin changes that accompany venous insufficiency and the long term sequela such as infection and recurring  ulceration.  Patient will be placed in [employer] which will be changed weekly drainage permitting.  In addition, behavioral modification including several periods of  elevation of the lower extremities during the day will be continued. Achieving a position with the ankles at heart level was stressed to the patient  The patient is instructed to begin routine exercise, especially walking on a daily basis  3. Essential hypertension Continue antihypertensive medications as already ordered, these medications have been reviewed and there are no changes at this time.   4. Persistent atrial fibrillation (HCC) Continue antiarrhythmia medications as already ordered, these medications have been reviewed and there are no changes at this time.  Continue anticoagulation as ordered by Cardiology Service   5. Pulmonary emphysema, unspecified emphysema type (HCC) Continue pulmonary medications and aerosols as already ordered, these medications have been reviewed and there are no changes at this time.      Levora Dredge, MD  09/03/2021 11:26 AM

## 2021-09-07 ENCOUNTER — Ambulatory Visit (INDEPENDENT_AMBULATORY_CARE_PROVIDER_SITE_OTHER): Payer: Medicare Other | Admitting: Nurse Practitioner

## 2021-09-07 ENCOUNTER — Encounter (INDEPENDENT_AMBULATORY_CARE_PROVIDER_SITE_OTHER): Payer: Self-pay | Admitting: Nurse Practitioner

## 2021-09-07 VITALS — BP 134/77 | HR 81 | Resp 16

## 2021-09-07 DIAGNOSIS — I10 Essential (primary) hypertension: Secondary | ICD-10-CM

## 2021-09-07 DIAGNOSIS — I83013 Varicose veins of right lower extremity with ulcer of ankle: Secondary | ICD-10-CM

## 2021-09-07 DIAGNOSIS — L97319 Non-pressure chronic ulcer of right ankle with unspecified severity: Secondary | ICD-10-CM | POA: Diagnosis not present

## 2021-09-14 ENCOUNTER — Ambulatory Visit (INDEPENDENT_AMBULATORY_CARE_PROVIDER_SITE_OTHER): Payer: Medicare Other | Admitting: Vascular Surgery

## 2021-09-14 ENCOUNTER — Encounter (INDEPENDENT_AMBULATORY_CARE_PROVIDER_SITE_OTHER): Payer: Self-pay | Admitting: Vascular Surgery

## 2021-09-14 VITALS — BP 123/67 | HR 73 | Resp 16

## 2021-09-14 DIAGNOSIS — I872 Venous insufficiency (chronic) (peripheral): Secondary | ICD-10-CM | POA: Diagnosis not present

## 2021-09-14 DIAGNOSIS — I10 Essential (primary) hypertension: Secondary | ICD-10-CM

## 2021-09-14 DIAGNOSIS — I4819 Other persistent atrial fibrillation: Secondary | ICD-10-CM

## 2021-09-14 DIAGNOSIS — I83003 Varicose veins of unspecified lower extremity with ulcer of ankle: Secondary | ICD-10-CM

## 2021-09-14 DIAGNOSIS — L97301 Non-pressure chronic ulcer of unspecified ankle limited to breakdown of skin: Secondary | ICD-10-CM

## 2021-09-14 DIAGNOSIS — J439 Emphysema, unspecified: Secondary | ICD-10-CM

## 2021-09-14 NOTE — Progress Notes (Signed)
MRN : [id number]  Bethany Joseph is a 76 y.o. (1945-03-31) female who presents with chief complaint of legs hurt and swell.  History of Present Illness:   Patient is seen for follow up evaluation of leg pain and swelling associated with venous ulceration. The patient was recently seen here and started on Unna boot therapy.  The swelling abruptly became much worse on the right and is associated with pain and discoloration. The pain and swelling worsens with prolonged dependency and improves with elevation.  The patient notes that in the morning the legs are better but the leg symptoms worsened throughout the course of the day. The patient has also noted a progressive worsening of the discoloration in the ankle and shin area.   The patient notes that an ulcer has developed acutely without specific trauma and since it occurred it has been very slow to heal.  There is a moderate amount of drainage associated with the open area.  The wound is also painful.  The patient states that they have been elevating as much as possible. The patient denies any recent changes in medications.  The patient denies a history of DVT or PE. There is no prior history of phlebitis. There is no history of primary lymphedema.  No SOB or increased cough.  No sputum production.  No recent episodes of CHF exacerbation.   Current Meds  Medication Sig   albuterol (PROVENTIL) (2.5 MG/3ML) 0.083% nebulizer solution Inhale into the lungs.   Bacillus Coagulans-Inulin (ALIGN PREBIOTIC-PROBIOTIC PO) Take 1 capsule by mouth daily.   Cholecalciferol 4000 units CAPS Take 1 capsule by mouth daily.   doxepin (SINEQUAN) 10 MG capsule Take 10 mg by mouth at bedtime.   ELIQUIS 5 MG TABS tablet Take 5 mg by mouth every 12 (twelve) hours.   ferrous sulfate 325 (65 FE) MG EC tablet Take by mouth.   gabapentin (NEURONTIN) 100 MG capsule Take 100 mg by mouth daily.   gabapentin (NEURONTIN) 400 MG capsule Take 400 mg by  mouth at bedtime.   HYDROcodone-acetaminophen (NORCO/VICODIN) 5-325 MG tablet Take 1 tablet by mouth every 4 (four) hours as needed.   Lactobacillus (FLORAJEN ACIDOPHILUS PO) Take by mouth.   levothyroxine (SYNTHROID, LEVOTHROID) 25 MCG tablet Take 25 mcg by mouth daily before breakfast.   liothyronine (CYTOMEL) 25 MCG tablet Take 75 mcg by mouth daily. 4 tabs   loratadine (CLARITIN) 10 MG tablet Take 10 mg by mouth daily.   losartan (COZAAR) 50 MG tablet Take 50 mg by mouth daily.   Melatonin 10 MG TABS Take 1 tablet by mouth at bedtime as needed.   metFORMIN (GLUCOPHAGE-XR) 500 MG 24 hr tablet Take 1,000 mg by mouth every evening.   Omega 3 1200 MG CAPS Take 1,200 mg by mouth daily.   omeprazole (PRILOSEC) 40 MG capsule TAKE ONE CAPSULE BY MOUTH DAILY   potassium chloride SA (KLOR-CON) 20 MEQ tablet Take 20 mEq by mouth daily.   pravastatin (PRAVACHOL) 20 MG tablet Take 20 mg by mouth.   torsemide (DEMADEX) 20 MG tablet Take 40 mg by mouth 2 (two) times daily.   vitamin E 1000 UNIT capsule Take by mouth.   zolpidem (AMBIEN) 5 MG tablet Take 5 mg by mouth at bedtime as needed for sleep.    Past Medical History:  Diagnosis Date   Acquired hypothyroidism    Diagnosed with peripheral resistance   Allergic rhinitis    Aortic valve  disorder    Arrhythmia    Arthritis    Asthma    Asthma without status asthmaticus    unspecified   At risk for falls    uses cane, arthritis   Atrial fibrillation (HCC)    Permanent at this time CHADSVASc=3 (age 73, DM, female) 05/30/09 - Afib at 107 4/25 A fib 88 with better rate control on Multaq, but dc Multaq since in permanent A Fib, refuses Coumadin therapy    Avascular necrosis (HCC)    left foot   Diabetes (HCC)    Diabetic neuropathy (HCC)    unspecified   Difficult intubation    has a difficult intubation card   Fracture of distal femur (HCC)    left periprosthetic, status post ORIF   GERD (gastroesophageal reflux disease)    Gout    Heart  murmur    History of atrial fibrillation    History of chicken pox    Humerus distal fracture    left - in[place] on 05/21/12   Hyperkeratosis    Pre-ulcerative hyperkeratotic lesions first MTPJs   Hyperlipidemia    Hypertension    Hyperthyroidism    Mycotic toenails    Osteoarthritis    Osteoporosis, post-menopausal    Status post bilateral knee replacement. DEXA 09/2009: t=-1.6 spine, t=-3.1 right fem neck   Paroxysmal tachycardia (HCC)    now in chronic A fib but rate is well controlled   Type II diabetes mellitus (HCC)     Past Surgical History:  Procedure Laterality Date   CHOLECYSTECTOMY     ENDOSCOPIC RETROGRADE CHOLANGIOPANCREATOGRAPHY (ERCP) WITH PROPOFOL N/A 08/25/2019   Procedure: ENDOSCOPIC RETROGRADE CHOLANGIOPANCREATOGRAPHY (ERCP) WITH PROPOFOL;  Surgeon: Midge Minium, MD;  Location: [facility] ENDOSCOPY;  Service: Endoscopy;  Laterality: N/A;   ERCP N/A 11/03/2019   Procedure: ENDOSCOPIC RETROGRADE CHOLANGIOPANCREATOGRAPHY (ERCP);  Surgeon: Midge Minium, MD;  [facility] ENDOSCOPY;  Service: Endoscopy;  Laterality: N/A;   FEMUR FRACTURE SURGERY Left 2010   INTRAMEDULLARY (IM) NAIL INTERTROCHANTERIC Right 07/15/2017   Procedure: INTRAMEDULLARY (IM) NAIL INTERTROCHANTRIC;  Surgeon: Kennedy Bucker, MD;  Location: [facility] ORS;  Service: Orthopedics;  Laterality: Right;   JOINT REPLACEMENT  2007   knee replacement surgeries   ORIF FIBULA FRACTURE     ORIF HUMERUS FRACTURE Left 05/28/2012   supracondylar distal   REPLACEMENT TOTAL KNEE BILATERAL     THYROIDECTOMY  1975   [place]   TOTAL ABDOMINAL HYSTERECTOMY W/ BILATERAL SALPINGOOPHORECTOMY  1983   [place]   TOTAL KNEE ARTHROPLASTY Bilateral     Social History Social History   Tobacco Use   Smoking status: Former    Types: Cigarettes    Quit date: 02/13/1979    Years since quitting: 42.6   Smokeless tobacco: Never  Vaping Use   Vaping Use: Never used  Substance Use Topics   Alcohol use: Yes    Alcohol/week: 14.0 standard drinks  of alcohol    Types: 14 Glasses of wine per week   Drug use: No    Family History Family History  Problem Relation Age of Onset   Colon cancer Mother    Osteoporosis Mother    Arthritis Mother    Early death Mother    Diabetes Father    Hypertension Father    Heart attack Father    Coronary artery disease Father    Early death Father    Heart disease Father    Hyperlipidemia Father    Depression Brother    Diabetes Brother  Mental illness Brother    Asthma Maternal Grandfather    COPD Maternal Grandfather    Asthma Maternal Aunt    COPD Maternal Aunt    Cancer Maternal Aunt     Allergies  Allergen Reactions   Neosporin [Neomycin-Polymyxin-Gramicidin] Hives   Tape     Other reaction(s): Unknown Adhesive bandage   Codeine Rash   Latex Rash   Neomycin-Bacitracin Zn-Polymyx Rash    Other reaction(s): Unknown DERMATOLOGICALS    Penicillins Rash and Other (See Comments)    Has patient had a PCN reaction causing immediate rash, facial/tongue/throat swelling, SOB or lightheadedness with hypotension: Unknown Has patient had a PCN reaction causing severe rash involving mucus membranes or skin necrosis: Unknown Has patient had a PCN reaction that required hospitalization: Unknown Has patient had a PCN reaction occurring within the last 10 years: No If all of the above answers are "NO", then may proceed with Cephalosporin use.    Silicone Rash    bandages     REVIEW OF SYSTEMS (Negative unless checked)  Constitutional: [] Weight loss  [] Fever  [] Chills Cardiac: [] Chest pain   [] Chest pressure   [] Palpitations   [] Shortness of breath when laying flat   [] Shortness of breath with exertion. Vascular:  [] Pain in legs with walking   [x] Pain in legs at rest  [] History of DVT   [] Phlebitis   [x] Swelling in legs   [] Varicose veins   [] Non-healing ulcers Pulmonary:   [] Uses home oxygen   [] Productive cough   [] Hemoptysis   [] Wheeze  [] COPD   [] Asthma Neurologic:  [] Dizziness    [] Seizures   [] History of stroke   [] History of TIA  [] Aphasia   [] Vissual changes   [] Weakness or numbness in arm   [] Weakness or numbness in leg Musculoskeletal:   [] Joint swelling   [] Joint pain   [] Low back pain Hematologic:  [] Easy bruising  [] Easy bleeding   [] Hypercoagulable state   [] Anemic Gastrointestinal:  [] Diarrhea   [] Vomiting  [] Gastroesophageal reflux/heartburn   [] Difficulty swallowing. Genitourinary:  [] Chronic kidney disease   [] Difficult urination  [] Frequent urination   [] Blood in urine Skin:  [] Rashes   [] Ulcers  Psychological:  [] History of anxiety   []  History of major depression.  Physical Examination  Vitals:   09/14/21 1111  BP: 123/67  Pulse: 73  Resp: 16   There is no height or weight on file to calculate BMI. Gen: WD/WN, NAD Head: [place]/AT, No temporalis wasting.  Ear/Nose/Throat: Hearing grossly intact, nares w/o erythema or drainage, pinna without lesions Eyes: PER, EOMI, sclera nonicteric.  Neck: Supple, no gross masses.  No JVD.  Pulmonary:  Good air movement, no audible wheezing, no use of accessory muscles.  Cardiac: RRR, precordium not hyperdynamic. Vascular:  scattered varicosities present bilaterally.  Moderate venous stasis changes to the legs bilaterally.  trace edema on the right.  The right ulcer is now down to 3-4 mm in diameter and is noninfected Vessel Right Left  Radial Palpable Palpable  Gastrointestinal: soft, non-distended. No guarding/no peritoneal signs.  Musculoskeletal: M/S 5/5 throughout.  No deformity.  Neurologic: CN 2-12 intact. Pain and light touch intact in extremities.  Symmetrical.  Speech is fluent. Motor exam as listed above. Psychiatric: Judgment intact, Mood & affect appropriate for pt's clinical situation. Dermatologic: Venous rashes no ulcers noted.  No changes consistent with cellulitis. Lymph : No lichenification or skin changes of chronic lymphedema.  CBC Lab Results  Component Value Date   WBC 9.6 02/07/2020    HGB 8.0 (  L) 02/07/2020   HCT 25.7 (L) 02/07/2020   MCV 86.5 02/07/2020   PLT 224 02/07/2020    BMET    Component Value Date/Time   NA 137 02/07/2020 0543   NA 136 05/27/2012 0917   K 3.4 (L) 02/07/2020 0543   K 4.0 05/27/2012 0917   CL 104 02/07/2020 0543   CL 102 05/27/2012 0917   CO2 24 02/07/2020 0543   CO2 28 05/27/2012 0917   GLUCOSE 155 (H) 02/07/2020 0543   GLUCOSE 140 (H) 05/27/2012 0917   BUN 22 02/07/2020 0543   BUN 13 05/27/2012 0917   CREATININE 0.67 02/07/2020 0543   CREATININE 0.58 (L) 05/27/2012 0917   CALCIUM 8.7 (L) 02/07/2020 0543   CALCIUM 9.3 05/27/2012 0917   GFRNONAA >60 02/07/2020 0543   GFRNONAA >60 05/27/2012 0917   GFRAA >60 08/27/2019 0718   GFRAA >60 05/27/2012 0917   CrCl cannot be calculated (Patient's most recent lab result is older than the maximum 21 days allowed.).  COAG Lab Results  Component Value Date   INR 1.3 (H) 02/02/2020   INR 1.2 08/23/2019   INR 1.08 07/19/2016    Radiology No results found.   Assessment/Plan 1. 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    I have had long discussions with the patient regarding venous insufficiency and why it  causes symptoms, specifically venous ulceration. I have discussed with the patient the chronic skin changes that accompany venous insufficiency and the long term sequela such as infection and recurring  ulceration.  Her ulcer has continued to improve and is now quite small and very superficial.  I have described using a 2 layer system rather than placing her in an [employer].  She would begin by applying bacitracin ointment to the ulcer covering it with gauze then using a liner to keep this in place and placing her 20 to 30 mmHg compression sock over this.  She will change this on a daily basis.  After discussing the 2 possibilities she is elected to switch to the 2  layer system.    In addition, behavioral modification including several periods of elevation of the lower extremities during the day will be continued. Achieving a position with the ankles at heart level was stressed to the patient  The patient is instructed to begin routine exercise, especially walking on a daily basis  In the future the patient can be assessed for graduated compression stockings or wraps as well as a Lymph Pump once the ulcers are healed.   2. Chronic venous insufficiency See #1  3. Essential hypertension Continue antihypertensive medications as already ordered, these medications have been reviewed and there are no changes at this time.   4. Persistent atrial fibrillation (HCC) Continue antiarrhythmia medications as already ordered, these medications have been reviewed and there are no changes at this time.  Continue anticoagulation as ordered by Cardiology Service   5. Pulmonary emphysema, unspecified emphysema type (HCC) Continue pulmonary medications and aerosols as already ordered, these medications have been reviewed and there are no changes at this time.      Levora Dredge, MD  09/14/2021 11:22 AM

## 2021-09-15 ENCOUNTER — Encounter (INDEPENDENT_AMBULATORY_CARE_PROVIDER_SITE_OTHER): Payer: Self-pay | Admitting: Vascular Surgery

## 2021-09-15 ENCOUNTER — Telehealth (INDEPENDENT_AMBULATORY_CARE_PROVIDER_SITE_OTHER): Payer: Self-pay | Admitting: Vascular Surgery

## 2021-09-15 MED ORDER — BACITRACIN ZINC 500 UNIT/GM EX OINT: TOPICAL_OINTMENT | CUTANEOUS | 0 refills | Status: AC

## 2021-09-15 NOTE — Telephone Encounter (Signed)
Talked with Schnier and he will be sending medication to patients pharmacy. If nurse could call and update patient    Messages from secure chat   Hey Dr. Gilda Crease I didn't see anything in her chart but she is calling about some ointment that you were going to send in for her, does this ring a bell to you ??    yes it's bacitracin but it is over the counter we can put in for it if she wants   I think she does want you to put it in, if you don't mind    will do!   Thank you!!!! Dr. Gilda Crease       Please call and advise

## 2021-09-15 NOTE — Telephone Encounter (Signed)
Patient called in stating that at her OV she was supposed to be getting an ointment sent to pharmacy. States that its still not there. I let patient know I didn't see anything in her chart and sent Dr a secure chat message to find out about it. Let her know once we confirm the ointment the nurse will send to the pharmacy as well as give her a call letting her know that it is at the pharmacy      Please call and advise

## 2021-09-15 NOTE — Telephone Encounter (Signed)
Patient has been made aware medication has been sent

## 2021-09-25 ENCOUNTER — Encounter (INDEPENDENT_AMBULATORY_CARE_PROVIDER_SITE_OTHER): Payer: Self-pay | Admitting: Nurse Practitioner

## 2021-09-25 NOTE — Progress Notes (Signed)
Subjective:    Patient ID: Bethany Joseph, female    DOB: Feb 06, 1946, 76 y.o.   MRN: [id number] Chief Complaint  Patient presents with   Follow-up    1 week follow up    The patient presents today for evaluation of her right lower extremity wound.  Patient swelling is well controlled but she still continues to have a small wound on the lateral edge of her right leg.  She continues to tolerate the Unna boots well.  Wound is improved after several weeks of Unna boots.    Review of Systems  Cardiovascular:  Positive for leg swelling.  Skin:  Positive for wound.  All other systems reviewed and are negative.      Objective:   Physical Exam Vitals reviewed.  HENT:     Head: Normocephalic.  Cardiovascular:     Rate and Rhythm: Normal rate.  Pulmonary:     Effort: Pulmonary effort is normal.  Musculoskeletal:     Right lower leg: Edema present.  Skin:    General: Skin is warm and dry.  Neurological:     Mental Status: She is alert and oriented to person, place, and time.  Psychiatric:        Mood and Affect: Mood normal.        Behavior: Behavior normal.        Thought Content: Thought content normal.        Judgment: Judgment normal.     BP 134/77 (BP Location: Right Arm)   Pulse 81   Resp 16   Past Medical History:  Diagnosis Date   Acquired hypothyroidism    Diagnosed with peripheral resistance   Allergic rhinitis    Aortic valve disorder    Arrhythmia    Arthritis    Asthma    Asthma without status asthmaticus    unspecified   At risk for falls    uses cane, arthritis   Atrial fibrillation (HCC)    Permanent at this time CHADSVASc=3 (age 70, DM, female) 05/30/09 - Afib at 107 4/25 A fib 88 with better rate control on Multaq, but dc Multaq since in permanent A Fib, refuses Coumadin therapy    Avascular necrosis (HCC)    left foot   Diabetes (HCC)    Diabetic neuropathy (HCC)    unspecified   Difficult intubation    has a difficult intubation card    Fracture of distal femur (HCC)    left periprosthetic, status post ORIF   GERD (gastroesophageal reflux disease)    Gout    Heart murmur    History of atrial fibrillation    History of chicken pox    Humerus distal fracture    left - inury on 05/21/12   Hyperkeratosis    Pre-ulcerative hyperkeratotic lesions first MTPJs   Hyperlipidemia    Hypertension    Hyperthyroidism    Mycotic toenails    Osteoarthritis    Osteoporosis, post-menopausal    Status post bilateral knee replacement. DEXA 09/2009: t=-1.6 spine, t=-3.1 right fem neck   Paroxysmal tachycardia (HCC)    now in chronic A fib but rate is well controlled   Type II diabetes mellitus (HCC)     Social History   Socioeconomic History   Marital status: Married    Spouse name: Molly Maduro   Number of children: 2   Years of education: 12   Highest education level: High school graduate  Occupational History   Not on file  Tobacco Use  Smoking status: Former    Types: Cigarettes    Quit date: 02/13/1979    Years since quitting: 42.6   Smokeless tobacco: Never  Vaping Use   Vaping Use: Never used  Substance and Sexual Activity   Alcohol use: Yes    Alcohol/week: 14.0 standard drinks of alcohol    Types: 14 Glasses of wine per week   Drug use: No   Sexual activity: Not Currently  Other Topics Concern   Not on file  Social History Narrative   Not on file   Social Determinants of Health   Financial Resource Strain: Not on file  Food Insecurity: Not on file  Transportation Needs: Not on file  Physical Activity: Not on file  Stress: Not on file  Social Connections: Not on file  Intimate Partner Violence: Not on file    Past Surgical History:  Procedure Laterality Date   CHOLECYSTECTOMY     ENDOSCOPIC RETROGRADE CHOLANGIOPANCREATOGRAPHY (ERCP) WITH PROPOFOL N/A 08/25/2019   Procedure: ENDOSCOPIC RETROGRADE CHOLANGIOPANCREATOGRAPHY (ERCP) WITH PROPOFOL;  Surgeon: Midge Minium, MD;  Location: [facility] ENDOSCOPY;   Service: Endoscopy;  Laterality: N/A;   ERCP N/A 11/03/2019   Procedure: ENDOSCOPIC RETROGRADE CHOLANGIOPANCREATOGRAPHY (ERCP);  Surgeon: Midge Minium, MD;  Location: [facility] ENDOSCOPY;  Service: Endoscopy;  Laterality: N/A;   FEMUR FRACTURE SURGERY Left 2010   INTRAMEDULLARY (IM) NAIL INTERTROCHANTERIC Right 07/15/2017   Procedure: INTRAMEDULLARY (IM) NAIL INTERTROCHANTRIC;  Surgeon: Kennedy Bucker, MD;  Location: [facility] ORS;  Service: Orthopedics;  Laterality: Right;   JOINT REPLACEMENT  2007   knee replacement surgeries   ORIF FIBULA FRACTURE     ORIF HUMERUS FRACTURE Left 05/28/2012   supracondylar distal   REPLACEMENT TOTAL KNEE BILATERAL     THYROIDECTOMY  1975   [place]   TOTAL ABDOMINAL HYSTERECTOMY W/ BILATERAL SALPINGOOPHORECTOMY  1983   [place]   TOTAL KNEE ARTHROPLASTY Bilateral     Family History  Problem Relation Age of Onset   Colon cancer Mother    Osteoporosis Mother    Arthritis Mother    Early death Mother    Diabetes Father    Hypertension Father    Heart attack Father    Coronary artery disease Father    Early death Father    Heart disease Father    Hyperlipidemia Father    Depression Brother    Diabetes Brother    Mental illness Brother    Asthma Maternal Grandfather    COPD Maternal Grandfather    Asthma Maternal Aunt    COPD Maternal Aunt    Cancer Maternal Aunt     Allergies  Allergen Reactions   Neosporin [Neomycin-Polymyxin-Gramicidin] Hives   Tape     Other reaction(s): Unknown Adhesive bandage   Codeine Rash   Latex Rash   Neomycin-Bacitracin Zn-Polymyx Rash    Other reaction(s): Unknown DERMATOLOGICALS    Penicillins Rash and Other (See Comments)    Has patient had a PCN reaction causing immediate rash, facial/tongue/throat swelling, SOB or lightheadedness with hypotension: Unknown Has patient had a PCN reaction causing severe rash involving mucus membranes or skin necrosis: Unknown Has patient had a PCN reaction that required hospitalization:  Unknown Has patient had a PCN reaction occurring within the last 10 years: No If all of the above answers are "NO", then may proceed with Cephalosporin use.    Silicone Rash    bandages       Latest Ref Rng & Units 02/07/2020    5:43 AM 02/06/2020    5:09 AM  02/05/2020    4:15 AM  CBC  WBC 4.0 - 10.5 K/uL 9.6  7.4  8.3   Hemoglobin 12.0 - 15.0 g/dL 8.0  8.4  8.9   Hematocrit 36.0 - 46.0 % 25.7  27.1  27.9   Platelets 150 - 400 K/uL 224  188  166       CMP     Component Value Date/Time   NA 137 02/07/2020 0543   NA 136 05/27/2012 0917   K 3.4 (L) 02/07/2020 0543   K 4.0 05/27/2012 0917   CL 104 02/07/2020 0543   CL 102 05/27/2012 0917   CO2 24 02/07/2020 0543   CO2 28 05/27/2012 0917   GLUCOSE 155 (H) 02/07/2020 0543   GLUCOSE 140 (H) 05/27/2012 0917   BUN 22 02/07/2020 0543   BUN 13 05/27/2012 0917   CREATININE 0.67 02/07/2020 0543   CREATININE 0.58 (L) 05/27/2012 0917   CALCIUM 8.7 (L) 02/07/2020 0543   CALCIUM 9.3 05/27/2012 0917   PROT 9.2 (H) 02/02/2020 1948   ALBUMIN 3.9 02/02/2020 1948   AST 31 02/02/2020 1948   ALT 18 02/02/2020 1948   ALKPHOS 126 02/02/2020 1948   BILITOT 0.9 02/02/2020 1948   GFRNONAA >60 02/07/2020 0543   GFRNONAA >60 05/27/2012 0917   GFRAA >60 08/27/2019 0718   GFRAA >60 05/27/2012 0917     No results found.     Assessment & Plan:   1. Venous ulcer of ankle, right (HCC) The patient continues to have venous ulcer.  We will continue to have the patient in Unna boots.  She will follow-up next week to determine progress with wound healing.  2. Essential hypertension Continue antihypertensive medications as already ordered, these medications have been reviewed and there are no changes at this time.    Current Outpatient Medications on File Prior to Visit  Medication Sig Dispense Refill   albuterol (PROVENTIL) (2.5 MG/3ML) 0.083% nebulizer solution Inhale into the lungs.     Bacillus Coagulans-Inulin (ALIGN  PREBIOTIC-PROBIOTIC PO) Take 1 capsule by mouth daily.     Cholecalciferol 4000 units CAPS Take 1 capsule by mouth daily.     doxepin (SINEQUAN) 10 MG capsule Take 10 mg by mouth at bedtime.     ELIQUIS 5 MG TABS tablet Take 5 mg by mouth every 12 (twelve) hours.  11   ferrous sulfate 325 (65 FE) MG EC tablet Take by mouth.     gabapentin (NEURONTIN) 100 MG capsule Take 100 mg by mouth daily.     gabapentin (NEURONTIN) 400 MG capsule Take 400 mg by mouth at bedtime.     HYDROcodone-acetaminophen (NORCO/VICODIN) 5-325 MG tablet Take 1 tablet by mouth every 4 (four) hours as needed.     Lactobacillus (FLORAJEN ACIDOPHILUS PO) Take by mouth.     levothyroxine (SYNTHROID, LEVOTHROID) 25 MCG tablet Take 25 mcg by mouth daily before breakfast.     liothyronine (CYTOMEL) 25 MCG tablet Take 75 mcg by mouth daily. 4 tabs     loratadine (CLARITIN) 10 MG tablet Take 10 mg by mouth daily.     losartan (COZAAR) 50 MG tablet Take 50 mg by mouth daily.     Melatonin 10 MG TABS Take 1 tablet by mouth at bedtime as needed.     metFORMIN (GLUCOPHAGE-XR) 500 MG 24 hr tablet Take 1,000 mg by mouth every evening.     Omega 3 1200 MG CAPS Take 1,200 mg by mouth daily.     omeprazole (PRILOSEC) 40 MG  capsule TAKE ONE CAPSULE BY MOUTH DAILY     potassium chloride SA (KLOR-CON) 20 MEQ tablet Take 20 mEq by mouth daily.     pravastatin (PRAVACHOL) 20 MG tablet Take 20 mg by mouth.     torsemide (DEMADEX) 20 MG tablet Take 40 mg by mouth 2 (two) times daily.     vitamin E 1000 UNIT capsule Take by mouth.     zolpidem (AMBIEN) 5 MG tablet Take 5 mg by mouth at bedtime as needed for sleep.     acetaminophen (TYLENOL) 325 MG tablet Take 650 mg by mouth every 4 (four) hours as needed. (Patient not taking: Reported on 08/24/2021)     albuterol (VENTOLIN HFA) 108 (90 Base) MCG/ACT inhaler USE 2 PUFFS TWICE DAILY AS NEEDED shortness of breath (Patient not taking: Reported on 02/03/2020)     bisacodyl (DULCOLAX) 10 MG  suppository Place 10 mg rectally as needed for moderate constipation. (Patient not taking: Reported on 08/24/2021)     nystatin (MYCOSTATIN) 100000 UNIT/ML suspension  (Patient not taking: Reported on 06/01/2020)     ondansetron (ZOFRAN) 4 MG tablet Take 1 tablet (4 mg total) by mouth every 6 (six) hours as needed for nausea. (Patient not taking: Reported on 02/23/2021) 20 tablet 0   raloxifene (EVISTA) 60 MG tablet TAKE ONE (1) TABLET BY MOUTH EVERY DAY (Patient not taking: No sig reported)     sennosides-docusate sodium (SENOKOT-S) 8.6-50 MG tablet Take 2 tablets by mouth 2 (two) times daily. (Patient not taking: Reported on 08/23/2019)     simethicone (MYLICON) 80 MG chewable tablet Chew 1 tablet (80 mg total) by mouth every 6 (six) hours as needed for flatulence. (Patient not taking: Reported on 02/03/2020) 30 tablet 0   No current facility-administered medications on file prior to visit.    There are no Patient Instructions on file for this visit. No follow-ups on file.   [name], NP

## 2021-10-12 ENCOUNTER — Ambulatory Visit (INDEPENDENT_AMBULATORY_CARE_PROVIDER_SITE_OTHER): Payer: Medicare Other | Admitting: Vascular Surgery

## 2021-10-19 ENCOUNTER — Encounter (INDEPENDENT_AMBULATORY_CARE_PROVIDER_SITE_OTHER): Payer: Self-pay | Admitting: Vascular Surgery

## 2021-10-19 ENCOUNTER — Ambulatory Visit (INDEPENDENT_AMBULATORY_CARE_PROVIDER_SITE_OTHER): Payer: Medicare Other | Admitting: Vascular Surgery

## 2021-10-19 VITALS — BP 141/80 | HR 80 | Resp 17

## 2021-10-19 DIAGNOSIS — I4819 Other persistent atrial fibrillation: Secondary | ICD-10-CM | POA: Diagnosis not present

## 2021-10-19 DIAGNOSIS — I83003 Varicose veins of unspecified lower extremity with ulcer of ankle: Secondary | ICD-10-CM

## 2021-10-19 DIAGNOSIS — I10 Essential (primary) hypertension: Secondary | ICD-10-CM

## 2021-10-19 DIAGNOSIS — L03115 Cellulitis of right lower limb: Secondary | ICD-10-CM | POA: Diagnosis not present

## 2021-10-19 DIAGNOSIS — J439 Emphysema, unspecified: Secondary | ICD-10-CM

## 2021-10-19 DIAGNOSIS — L97301 Non-pressure chronic ulcer of unspecified ankle limited to breakdown of skin: Secondary | ICD-10-CM | POA: Diagnosis not present

## 2021-10-20 ENCOUNTER — Other Ambulatory Visit (INDEPENDENT_AMBULATORY_CARE_PROVIDER_SITE_OTHER): Payer: Self-pay | Admitting: Nurse Practitioner

## 2021-10-20 ENCOUNTER — Encounter (INDEPENDENT_AMBULATORY_CARE_PROVIDER_SITE_OTHER): Payer: Self-pay | Admitting: Vascular Surgery

## 2021-10-20 DIAGNOSIS — L0390 Cellulitis, unspecified: Secondary | ICD-10-CM | POA: Insufficient documentation

## 2021-10-20 MED ORDER — DOXYCYCLINE HYCLATE 100 MG PO CAPS: 100.0000 mg | ORAL_CAPSULE | Freq: Two times a day (BID) | ORAL | 0 refills | Status: AC

## 2021-10-20 MED ORDER — ONDANSETRON HCL 4 MG PO TABS: 4.0000 mg | ORAL_TABLET | Freq: Four times a day (QID) | ORAL | 0 refills | Status: DC | PRN

## 2021-10-20 NOTE — Progress Notes (Signed)
MRN : [id number]  Bethany Joseph is a 76 y.o. (Feb 23, 1945) female who presents with chief complaint of legs hurt and swell.  History of Present Illness:   Patient is seen for follow up evaluation of leg pain and swelling associated with venous ulceration. The patient was recently seen here and started on a two layer wrap therapy.  Since the last visit her swelling is worse and is associated with pain and discoloration. The pain and swelling worsens with prolonged dependency and improves with elevation.  The patient notes that in the morning the legs are better but the leg symptoms worsened throughout the course of the day. The patient has also noted a progressive worsening of the discoloration in the ankle and shin area.   The patient notes that an ulcer has developed without specific trauma and since it occurred it has been very slow to heal.  They are small and superficial and multiple.  There is a moderate amount of drainage associated with the open area.  The wounds are also painful.  The patient states that they have been elevating as much as possible. The patient denies any recent changes in medications.  The patient denies a history of DVT or PE. There is no prior history of phlebitis. There is no history of primary lymphedema.  No SOB or increased cough.  No sputum production.  No recent episodes of CHF exacerbation.   Current Meds  Medication Sig   albuterol (PROVENTIL) (2.5 MG/3ML) 0.083% nebulizer solution Inhale into the lungs.   Bacillus Coagulans-Inulin (ALIGN PREBIOTIC-PROBIOTIC PO) Take 1 capsule by mouth daily.   bacitracin ointment Apply to leg ulcer bid and cover with gauze   Cholecalciferol 4000 units CAPS Take 1 capsule by mouth daily.   doxepin (SINEQUAN) 10 MG capsule Take 10 mg by mouth at bedtime.   doxycycline (VIBRAMYCIN) 100 MG capsule Take 1 capsule (100 mg total) by mouth 2 (two) times daily.   ELIQUIS 5 MG TABS tablet Take 5 mg by mouth  every 12 (twelve) hours.   ferrous sulfate 325 (65 FE) MG EC tablet Take by mouth.   gabapentin (NEURONTIN) 100 MG capsule Take 100 mg by mouth daily.   gabapentin (NEURONTIN) 400 MG capsule Take 400 mg by mouth at bedtime.   HYDROcodone-acetaminophen (NORCO/VICODIN) 5-325 MG tablet Take 1 tablet by mouth every 4 (four) hours as needed.   Lactobacillus (FLORAJEN ACIDOPHILUS PO) Take by mouth.   levothyroxine (SYNTHROID, LEVOTHROID) 25 MCG tablet Take 25 mcg by mouth daily before breakfast.   liothyronine (CYTOMEL) 25 MCG tablet Take 75 mcg by mouth daily. 4 tabs   loratadine (CLARITIN) 10 MG tablet Take 10 mg by mouth daily.   losartan (COZAAR) 50 MG tablet Take 50 mg by mouth daily.   Melatonin 10 MG TABS Take 1 tablet by mouth at bedtime as needed.   metFORMIN (GLUCOPHAGE-XR) 500 MG 24 hr tablet Take 1,000 mg by mouth every evening.   Omega 3 1200 MG CAPS Take 1,200 mg by mouth daily.   omeprazole (PRILOSEC) 40 MG capsule TAKE ONE CAPSULE BY MOUTH DAILY   potassium chloride SA (KLOR-CON) 20 MEQ tablet Take 20 mEq by mouth daily.   pravastatin (PRAVACHOL) 20 MG tablet Take 20 mg by mouth.   torsemide (DEMADEX) 20 MG tablet Take 40 mg by mouth 2 (two) times daily.   vitamin E 1000 UNIT capsule Take by mouth.   zolpidem (AMBIEN) 5 MG tablet  Take 5 mg by mouth at bedtime as needed for sleep.    Past Medical History:  Diagnosis Date   Acquired hypothyroidism    Diagnosed with peripheral resistance   Allergic rhinitis    Aortic valve disorder    Arrhythmia    Arthritis    Asthma    Asthma without status asthmaticus    unspecified   At risk for falls    uses cane, arthritis   Atrial fibrillation (HCC)    Permanent at this time CHADSVASc=3 (age 43, DM, female) 05/30/09 - Afib at 107 4/25 A fib 88 with better rate control on Multaq, but dc Multaq since in permanent A Fib, refuses Coumadin therapy    Avascular necrosis (HCC)    left foot   Diabetes (HCC)    Diabetic neuropathy (HCC)     unspecified   Difficult intubation    has a difficult intubation card   Fracture of distal femur (HCC)    left periprosthetic, status post ORIF   GERD (gastroesophageal reflux disease)    Gout    Heart murmur    History of atrial fibrillation    History of chicken pox    Humerus distal fracture    left - inury on 05/21/12   Hyperkeratosis    Pre-ulcerative hyperkeratotic lesions first MTPJs   Hyperlipidemia    Hypertension    Hyperthyroidism    Mycotic toenails    Osteoarthritis    Osteoporosis, post-menopausal    Status post bilateral knee replacement. DEXA 09/2009: t=-1.6 spine, t=-3.1 right fem neck   Paroxysmal tachycardia (HCC)    now in chronic A fib but rate is well controlled   Type II diabetes mellitus (HCC)     Past Surgical History:  Procedure Laterality Date   CHOLECYSTECTOMY     ENDOSCOPIC RETROGRADE CHOLANGIOPANCREATOGRAPHY (ERCP) WITH PROPOFOL N/A 08/25/2019   Procedure: ENDOSCOPIC RETROGRADE CHOLANGIOPANCREATOGRAPHY (ERCP) WITH PROPOFOL;  Surgeon: Midge Minium, MD;  Location: [facility] ENDOSCOPY;  Service: Endoscopy;  Laterality: N/A;   ERCP N/A 11/03/2019   Procedure: ENDOSCOPIC RETROGRADE CHOLANGIOPANCREATOGRAPHY (ERCP);  Surgeon: Midge Minium, MD;  Location: [facility] ENDOSCOPY;  Service: Endoscopy;  Laterality: N/A;   FEMUR FRACTURE SURGERY Left 2010   INTRAMEDULLARY (IM) NAIL INTERTROCHANTERIC Right 07/15/2017   Procedure: INTRAMEDULLARY (IM) NAIL INTERTROCHANTRIC;  Surgeon: Kennedy Bucker, MD;  Location: [facility] ORS;  Service: Orthopedics;  Laterality: Right;   JOINT REPLACEMENT  2007   knee replacement surgeries   ORIF FIBULA FRACTURE     ORIF HUMERUS FRACTURE Left 05/28/2012   supracondylar distal   REPLACEMENT TOTAL KNEE BILATERAL     THYROIDECTOMY  1975   [place]   TOTAL ABDOMINAL HYSTERECTOMY W/ BILATERAL SALPINGOOPHORECTOMY  1983   [place]   TOTAL KNEE ARTHROPLASTY Bilateral     Social History Social History   Tobacco Use   Smoking status: Former    Types:  Cigarettes    Quit date: 02/13/1979    Years since quitting: 42.7   Smokeless tobacco: Never  Vaping Use   Vaping Use: Never used  Substance Use Topics   Alcohol use: Yes    Alcohol/week: 14.0 standard drinks of alcohol    Types: 14 Glasses of wine per week   Drug use: No    Family History Family History  Problem Relation Age of Onset   Colon cancer Mother    Osteoporosis Mother    Arthritis Mother    Early death Mother    Diabetes Father    Hypertension Father  Heart attack Father    Coronary artery disease Father    Early death Father    Heart disease Father    Hyperlipidemia Father    Depression Brother    Diabetes Brother    Mental illness Brother    Asthma Maternal Grandfather    COPD Maternal Grandfather    Asthma Maternal Aunt    COPD Maternal Aunt    Cancer Maternal Aunt     Allergies  Allergen Reactions   Neosporin [Neomycin-Polymyxin-Gramicidin] Hives   Tape     Other reaction(s): Unknown Adhesive bandage   Codeine Rash   Latex Rash   Neomycin-Bacitracin Zn-Polymyx Rash    Other reaction(s): Unknown DERMATOLOGICALS    Penicillins Rash and Other (See Comments)    Has patient had a PCN reaction causing immediate rash, facial/tongue/throat swelling, SOB or lightheadedness with hypotension: Unknown Has patient had a PCN reaction causing severe rash involving mucus membranes or skin necrosis: Unknown Has patient had a PCN reaction that required hospitalization: Unknown Has patient had a PCN reaction occurring within the last 10 years: No If all of the above answers are "NO", then may proceed with Cephalosporin use.    Silicone Rash    bandages     REVIEW OF SYSTEMS (Negative unless checked)  Constitutional: [] Weight loss  [] Fever  [] Chills Cardiac: [] Chest pain   [] Chest pressure   [] Palpitations   [] Shortness of breath when laying flat   [] Shortness of breath with exertion. Vascular:  [] Pain in legs with walking   [x] Pain in legs at rest   [] History of DVT   [] Phlebitis   [x] Swelling in legs   [] Varicose veins   [x] Non-healing ulcers Pulmonary:   [] Uses home oxygen   [] Productive cough   [] Hemoptysis   [] Wheeze  [] COPD   [] Asthma Neurologic:  [] Dizziness   [] Seizures   [] History of stroke   [] History of TIA  [] Aphasia   [] Vissual changes   [] Weakness or numbness in arm   [] Weakness or numbness in leg Musculoskeletal:   [] Joint swelling   [] Joint pain   [] Low back pain Hematologic:  [] Easy bruising  [] Easy bleeding   [] Hypercoagulable state   [] Anemic Gastrointestinal:  [] Diarrhea   [] Vomiting  [] Gastroesophageal reflux/heartburn   [] Difficulty swallowing. Genitourinary:  [] Chronic kidney disease   [] Difficult urination  [] Frequent urination   [] Blood in urine Skin:  [x] Rashes   [x] Ulcers  Psychological:  [] History of anxiety   []  History of major depression.  Physical Examination  Vitals:   10/19/21 1451  BP: (!) 141/80  Pulse: 80  Resp: 17   There is no height or weight on file to calculate BMI. Gen: WD/WN, NAD Head: [place]/AT, No temporalis wasting.  Ear/Nose/Throat: Hearing grossly intact, nares w/o erythema or drainage, pinna without lesions Eyes: PER, EOMI, sclera nonicteric.  Neck: Supple, no gross masses.  No JVD.  Pulmonary:  Good air movement, no audible wheezing, no use of accessory muscles.  Cardiac: RRR, precordium not hyperdynamic. Vascular:  scattered varicosities present bilaterally.  Severe venous stasis changes to the legs bilaterally.  2+ hard non-pitting edema of the right.  Multiple small superficial ulcers associated with erythremia and drainage. Vessel Right Left  Radial Palpable Palpable  Gastrointestinal: soft, non-distended. No guarding/no peritoneal signs.  Musculoskeletal: M/S 5/5 throughout.  No deformity.  Neurologic: CN 2-12 intact. Pain and light touch intact in extremities.  Symmetrical.  Speech is fluent. Motor exam as listed above. Psychiatric: Judgment intact, Mood & affect appropriate for  pt's clinical situation. Dermatologic:  Venous rashes no ulcers noted.  + changes consistent with cellulitis. Lymph : No lichenification or skin changes of chronic lymphedema.  CBC Lab Results  Component Value Date   WBC 9.6 02/07/2020   HGB 8.0 (L) 02/07/2020   HCT 25.7 (L) 02/07/2020   MCV 86.5 02/07/2020   PLT 224 02/07/2020    BMET    Component Value Date/Time   NA 137 02/07/2020 0543   NA 136 05/27/2012 0917   K 3.4 (L) 02/07/2020 0543   K 4.0 05/27/2012 0917   CL 104 02/07/2020 0543   CL 102 05/27/2012 0917   CO2 24 02/07/2020 0543   CO2 28 05/27/2012 0917   GLUCOSE 155 (H) 02/07/2020 0543   GLUCOSE 140 (H) 05/27/2012 0917   BUN 22 02/07/2020 0543   BUN 13 05/27/2012 0917   CREATININE 0.67 02/07/2020 0543   CREATININE 0.58 (L) 05/27/2012 0917   CALCIUM 8.7 (L) 02/07/2020 0543   CALCIUM 9.3 05/27/2012 0917   GFRNONAA >60 02/07/2020 0543   GFRNONAA >60 05/27/2012 0917   GFRAA >60 08/27/2019 0718   GFRAA >60 05/27/2012 0917   CrCl cannot be calculated (Patient's most recent lab result is older than the maximum 21 days allowed.).  COAG Lab Results  Component Value Date   INR 1.3 (H) 02/02/2020   INR 1.2 08/23/2019   INR 1.08 07/19/2016    Radiology No results found.   Assessment/Plan 1. Cellulitis of right lower extremity Given the changes now noted the two layer system has not been effective and it now appears that there is cellulitis.  Change to Unna wraps and start Doxycycline  2. 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    I have had a long discussion with the patient regarding venous insufficiency and why it  causes symptoms, specifically venous ulceration. I have discussed with the patient the chronic skin changes that accompany venous insufficiency and the long term sequela such as infection and recurring  ulceration.  Patient  will be placed in [employer] which will be changed weekly drainage permitting.  In addition, behavioral modification including several periods of elevation of the lower extremities during the day will be continued. Achieving a position with the ankles at heart level was stressed to the patient  The patient is instructed to begin routine exercise, especially walking on a daily basis  In the future the patient can be assessed for graduated compression stockings or wraps as well as a Lymph Pump once the ulcers are healed.   3. Essential hypertension Continue antihypertensive medications as already ordered, these medications have been reviewed and there are no changes at this time.   4. Persistent atrial fibrillation (HCC) Continue antiarrhythmia medications as already ordered, these medications have been reviewed and there are no changes at this time.  Continue anticoagulation as ordered by Cardiology Service   5. Pulmonary emphysema, unspecified emphysema type (HCC) Continue pulmonary medications and aerosols as already ordered, these medications have been reviewed and there are no changes at this time.      Levora Dredge, MD  10/20/2021 10:42 AM

## 2021-10-26 ENCOUNTER — Ambulatory Visit (INDEPENDENT_AMBULATORY_CARE_PROVIDER_SITE_OTHER): Payer: Medicare Other | Admitting: Nurse Practitioner

## 2021-10-26 ENCOUNTER — Encounter (INDEPENDENT_AMBULATORY_CARE_PROVIDER_SITE_OTHER): Payer: Self-pay | Admitting: Nurse Practitioner

## 2021-10-26 VITALS — BP 116/52 | HR 67 | Resp 16

## 2021-10-26 DIAGNOSIS — L97319 Non-pressure chronic ulcer of right ankle with unspecified severity: Secondary | ICD-10-CM

## 2021-10-26 DIAGNOSIS — I83013 Varicose veins of right lower extremity with ulcer of ankle: Secondary | ICD-10-CM

## 2021-10-26 NOTE — Progress Notes (Signed)
History of Present Illness  There is no documented history at this time  Assessments & Plan   There are no diagnoses linked to this encounter.    Additional instructions  Subjective:  Patient presents with venous ulcer of the Right lower extremity.    Procedure:  3 layer unna wrap was placed Right lower extremity.   Plan:   Follow up in one week.   

## 2021-10-30 ENCOUNTER — Encounter (INDEPENDENT_AMBULATORY_CARE_PROVIDER_SITE_OTHER): Payer: Self-pay | Admitting: Vascular Surgery

## 2021-10-31 ENCOUNTER — Other Ambulatory Visit (INDEPENDENT_AMBULATORY_CARE_PROVIDER_SITE_OTHER): Payer: Self-pay | Admitting: Nurse Practitioner

## 2021-10-31 MED ORDER — ONDANSETRON HCL 4 MG PO TABS: 4.0000 mg | ORAL_TABLET | Freq: Four times a day (QID) | ORAL | 0 refills | Status: DC | PRN

## 2021-10-31 NOTE — Telephone Encounter (Signed)
Refill sent.

## 2021-11-02 ENCOUNTER — Encounter (INDEPENDENT_AMBULATORY_CARE_PROVIDER_SITE_OTHER): Payer: Self-pay

## 2021-11-02 ENCOUNTER — Ambulatory Visit (INDEPENDENT_AMBULATORY_CARE_PROVIDER_SITE_OTHER): Payer: Medicare Other | Admitting: Vascular Surgery

## 2021-11-02 VITALS — BP 108/65 | HR 71 | Resp 16

## 2021-11-02 DIAGNOSIS — L97301 Non-pressure chronic ulcer of unspecified ankle limited to breakdown of skin: Secondary | ICD-10-CM | POA: Diagnosis not present

## 2021-11-02 DIAGNOSIS — I83003 Varicose veins of unspecified lower extremity with ulcer of ankle: Secondary | ICD-10-CM | POA: Diagnosis not present

## 2021-11-02 NOTE — Progress Notes (Signed)
Patient came in for right lower extremity unna wrap. Patient was seen by Dr Delana Meyer and patient was taking out of wraps. Patient has no open wounds or swelling. Patient will follow up in few months or if she starts with any weeping she can contact the office.

## 2021-12-11 ENCOUNTER — Encounter (INDEPENDENT_AMBULATORY_CARE_PROVIDER_SITE_OTHER): Payer: Self-pay

## 2022-01-30 NOTE — Progress Notes (Unsigned)
MRN : [id number]  Bethany Joseph is a 76 y.o. ([birth year]) female who presents with chief complaint of legs hurt and swell.  History of Present Illness:  Patient is seen for follow up evaluation of leg pain and swelling associated with venous ulceration. The patient was recently seen here and started on a two layer wrap therapy.  Since the last visit her swelling is worse and is associated with pain and discoloration. The pain and swelling worsens with prolonged dependency and improves with elevation.  The patient notes that in the morning the legs are better but the leg symptoms worsened throughout the course of the day. The patient has also noted a progressive worsening of the discoloration in the ankle and shin area.    The patient notes that an ulcer has developed without specific trauma and since it occurred it has been very slow to heal.  They are small and superficial and multiple.  There is a moderate amount of drainage associated with the open area.  The wounds are also painful.   The patient states that they have been elevating as much as possible. The patient denies any recent changes in medications.   The patient denies a history of DVT or PE. There is no prior history of phlebitis. There is no history of primary lymphedema.   No SOB or increased cough.  No sputum production.  No recent episodes of CHF exacerbation.   No outpatient medications have been marked as taking for the 02/01/22 encounter (Appointment) with Gilda Crease, Latina Craver, MD.    Past Medical History:  Diagnosis Date   Acquired hypothyroidism    Diagnosed with peripheral resistance   Allergic rhinitis    Aortic valve disorder    Arrhythmia    Arthritis    Asthma    Asthma without status asthmaticus    unspecified   At risk for falls    uses cane, arthritis   Atrial fibrillation (HCC)    Permanent at this time CHADSVASc=3 (age 63, DM, female) 05/30/09 - Afib at 107 4/25 A fib 88 with better  rate control on Multaq, but dc Multaq since in permanent A Fib, refuses Coumadin therapy    Avascular necrosis (HCC)    left foot   Diabetes (HCC)    Diabetic neuropathy (HCC)    unspecified   Difficult intubation    has a difficult intubation card   Fracture of distal femur (HCC)    left periprosthetic, status post ORIF   GERD (gastroesophageal reflux disease)    Gout    Heart murmur    History of atrial fibrillation    History of chicken pox    Humerus distal fracture    left - [place] on 05/21/12   Hyperkeratosis    Pre-ulcerative hyperkeratotic lesions first MTPJs   Hyperlipidemia    Hypertension    Hyperthyroidism    Mycotic toenails    Osteoarthritis    Osteoporosis, post-menopausal    Status post bilateral knee replacement. DEXA 09/2009: t=-1.6 spine, t=-3.1 right fem neck   Paroxysmal tachycardia (HCC)    now in chronic A fib but rate is well controlled   Type II diabetes mellitus (HCC)     Past Surgical History:  Procedure Laterality Date   CHOLECYSTECTOMY     ENDOSCOPIC RETROGRADE CHOLANGIOPANCREATOGRAPHY (ERCP) WITH PROPOFOL N/A 08/25/2019   Procedure: ENDOSCOPIC RETROGRADE CHOLANGIOPANCREATOGRAPHY (ERCP) WITH PROPOFOL;  Surgeon: Midge Minium, MD;  Location: [facility] ENDOSCOPY;  Service: Endoscopy;  Laterality: N/A;   ERCP N/A 11/03/2019   Procedure: ENDOSCOPIC RETROGRADE CHOLANGIOPANCREATOGRAPHY (ERCP);  Surgeon: Midge Minium, MD;  Location[facility] ENDOSCOPY;  Service: Endoscopy;  Laterality: N/A;   FEMUR FRACTURE SURGERY Left 2010   INTRAMEDULLARY (IM) NAIL INTERTROCHANTERIC Right 07/15/2017   Procedure: INTRAMEDULLARY (IM) NAIL INTERTROCHANTRIC;  Surgeon: Kennedy Bucker, MD;  Location: [facility] ORS;  Service: Orthopedics;  Laterality: Right;   JOINT REPLACEMENT  2007   knee replacement surgeries   ORIF FIBULA FRACTURE     ORIF HUMERUS FRACTURE Left 05/28/2012   supracondylar distal   REPLACEMENT TOTAL KNEE BILATERAL     THYROIDECTOMY  1975   [place]   TOTAL ABDOMINAL  HYSTERECTOMY W/ BILATERAL SALPINGOOPHORECTOMY  1983   [place]   TOTAL KNEE ARTHROPLASTY Bilateral     Social History Social History   Tobacco Use   Smoking status: Former    Types: Cigarettes    Quit date: 02/13/1979    Years since quitting: 42.9   Smokeless tobacco: Never  Vaping Use   Vaping Use: Never used  Substance Use Topics   Alcohol use: Yes    Alcohol/week: 14.0 standard drinks of alcohol    Types: 14 Glasses of wine per week   Drug use: No    Family History Family History  Problem Relation Age of Onset   Colon cancer Mother    Osteoporosis Mother    Arthritis Mother    Early death Mother    Diabetes Father    Hypertension Father    Heart attack Father    Coronary artery disease Father    Early death Father    Heart disease Father    Hyperlipidemia Father    Depression Brother    Diabetes Brother    Mental illness Brother    Asthma Maternal Grandfather    COPD Maternal Grandfather    Asthma Maternal Aunt    COPD Maternal Aunt    Cancer Maternal Aunt     Allergies  Allergen Reactions   Neosporin [Neomycin-Polymyxin-Gramicidin] Hives   Tape     Other reaction(s): Unknown Adhesive bandage   Codeine Rash   Latex Rash   Neomycin-Bacitracin Zn-Polymyx Rash    Other reaction(s): Unknown DERMATOLOGICALS    Penicillins Rash and Other (See Comments)    Has patient had a PCN reaction causing immediate rash, facial/tongue/throat swelling, SOB or lightheadedness with hypotension: Unknown Has patient had a PCN reaction causing severe rash involving mucus membranes or skin necrosis: Unknown Has patient had a PCN reaction that required hospitalization: Unknown Has patient had a PCN reaction occurring within the last 10 years: No If all of the above answers are "NO", then may proceed with Cephalosporin use.    Silicone Rash    bandages     REVIEW OF SYSTEMS (Negative unless checked)  Constitutional: [] Weight loss  [] Fever  [] Chills Cardiac: [] Chest pain    [] Chest pressure   [] Palpitations   [] Shortness of breath when laying flat   [] Shortness of breath with exertion. Vascular:  [] Pain in legs with walking   [x] Pain in legs at rest  [] History of DVT   [] Phlebitis   [x] Swelling in legs   [] Varicose veins   [] Non-healing ulcers Pulmonary:   [] Uses home oxygen   [] Productive cough   [] Hemoptysis   [] Wheeze  [x] COPD   [] Asthma Neurologic:  [] Dizziness   [] Seizures   [] History of stroke   [] History of TIA  [] Aphasia   [] Vissual changes   [] Weakness or numbness in arm   []   Weakness or numbness in leg Musculoskeletal:   [] Joint swelling   [x] Joint pain   [] Low back pain Hematologic:  [] Easy bruising  [] Easy bleeding   [] Hypercoagulable state   [] Anemic Gastrointestinal:  [] Diarrhea   [] Vomiting  [] Gastroesophageal reflux/heartburn   [] Difficulty swallowing. Genitourinary:  [] Chronic kidney disease   [] Difficult urination  [] Frequent urination   [] Blood in urine Skin:  [] Rashes   [] Ulcers  Psychological:  [] History of anxiety   []  History of major depression.  Physical Examination  There were no vitals filed for this visit. There is no height or weight on file to calculate BMI. Gen: WD/WN, NAD Head: [place]/AT, No temporalis wasting.  Ear/Nose/Throat: Hearing grossly intact, nares w/o erythema or drainage, pinna without lesions Eyes: PER, EOMI, sclera nonicteric.  Neck: Supple, no gross masses.  No JVD.  Pulmonary:  Good air movement, no audible wheezing, no use of accessory muscles.  Cardiac: RRR, precordium not hyperdynamic. Vascular:  scattered varicosities present bilaterally.  Moderate venous stasis changes to the legs bilaterally.  2+ soft pitting edema  Vessel Right Left  Radial Palpable Palpable  Gastrointestinal: soft, non-distended. No guarding/no peritoneal signs.  Musculoskeletal: M/S 5/5 throughout.  No deformity.  Neurologic: CN 2-12 intact. Pain and light touch intact in extremities.  Symmetrical.  Speech is fluent. Motor exam as listed  above. Psychiatric: Judgment intact, Mood & affect appropriate for pt's clinical situation. Dermatologic: Venous rashes no ulcers noted.  No changes consistent with cellulitis. Lymph : No lichenification or skin changes of chronic lymphedema.  CBC Lab Results  Component Value Date   WBC 9.6 02/07/2020   HGB 8.0 (L) 02/07/2020   HCT 25.7 (L) 02/07/2020   MCV 86.5 02/07/2020   PLT 224 02/07/2020    BMET    Component Value Date/Time   NA 137 02/07/2020 0543   NA 136 05/27/2012 0917   K 3.4 (L) 02/07/2020 0543   K 4.0 05/27/2012 0917   CL 104 02/07/2020 0543   CL 102 05/27/2012 0917   CO2 24 02/07/2020 0543   CO2 28 05/27/2012 0917   GLUCOSE 155 (H) 02/07/2020 0543   GLUCOSE 140 (H) 05/27/2012 0917   BUN 22 02/07/2020 0543   BUN 13 05/27/2012 0917   CREATININE 0.67 02/07/2020 0543   CREATININE 0.58 (L) 05/27/2012 0917   CALCIUM 8.7 (L) 02/07/2020 0543   CALCIUM 9.3 05/27/2012 0917   GFRNONAA >60 02/07/2020 0543   GFRNONAA >60 05/27/2012 0917   GFRAA >60 08/27/2019 0718   GFRAA >60 05/27/2012 0917   CrCl cannot be calculated (Patient's most recent lab result is older than the maximum 21 days allowed.).  COAG Lab Results  Component Value Date   INR 1.3 (H) 02/02/2020   INR 1.2 08/23/2019   INR 1.08 07/19/2016    Radiology No results found.   Assessment/Plan There are no diagnoses linked to this encounter.   02/09/2020, MD  01/30/2022 1:03 PM

## 2022-02-01 ENCOUNTER — Encounter (INDEPENDENT_AMBULATORY_CARE_PROVIDER_SITE_OTHER): Payer: Self-pay | Admitting: Vascular Surgery

## 2022-02-01 ENCOUNTER — Ambulatory Visit (INDEPENDENT_AMBULATORY_CARE_PROVIDER_SITE_OTHER): Payer: Medicare Other | Admitting: Vascular Surgery

## 2022-02-01 VITALS — BP 135/73 | HR 68 | Resp 16 | Ht 68.0 in | Wt 199.0 lb

## 2022-02-01 DIAGNOSIS — I10 Essential (primary) hypertension: Secondary | ICD-10-CM

## 2022-02-01 DIAGNOSIS — I4819 Other persistent atrial fibrillation: Secondary | ICD-10-CM

## 2022-02-01 DIAGNOSIS — I890 Lymphedema, not elsewhere classified: Secondary | ICD-10-CM

## 2022-02-01 DIAGNOSIS — I872 Venous insufficiency (chronic) (peripheral): Secondary | ICD-10-CM

## 2022-02-01 DIAGNOSIS — J439 Emphysema, unspecified: Secondary | ICD-10-CM | POA: Diagnosis not present

## 2022-02-01 DIAGNOSIS — E11622 Type 2 diabetes mellitus with other skin ulcer: Secondary | ICD-10-CM

## 2022-03-20 NOTE — Progress Notes (Deleted)
MRN : [id number]  Bethany Joseph is a 77 y.o. ([birth year]) female who presents with chief complaint of legs swell.  History of Present Illness:   Patient is seen for follow up evaluation of leg pain and swelling associated with moderate to severe venous stasis changes.  For the most part she reports that her ulcers have healed she has been very compliant with her 20-30 graduated compression but there is 1 area in the medial right ankle that still seems to be a bit raw and painful.  It is located directly under the seem for the zipper of her compression stockings.  She is wearing her stockings on a daily basis.   The patient states that they have been elevating as much as possible. The patient denies any recent changes in medications.   The patient denies a history of DVT or PE. There is no prior history of phlebitis. There is no history of primary lymphedema.   No SOB or increased cough.  No sputum production.  No recent episodes of CHF exacerbation.   No outpatient medications have been marked as taking for the 03/22/22 encounter (Appointment) with Delana Meyer, Dolores Lory, MD.    Past Medical History:  Diagnosis Date   Acquired hypothyroidism    Diagnosed with peripheral resistance   Allergic rhinitis    Aortic valve disorder    Arrhythmia    Arthritis    Asthma    Asthma without status asthmaticus    unspecified   At risk for falls    uses cane, arthritis   Atrial fibrillation ([place])    Permanent at this time CHADSVASc=3 (age 33, DM, female) 05/30/09 - Afib at 107 4/25 A fib 88 with better rate control on Multaq, but dc Multaq since in permanent A Fib, refuses Coumadin therapy    Avascular necrosis ([place])    left foot   Diabetes ([place])    Diabetic neuropathy ([place])    unspecified   Difficult intubation    has a difficult intubation card   Fracture of distal femur ([place])    left periprosthetic, status post ORIF   GERD (gastroesophageal reflux disease)    Gout    Heart  murmur    History of atrial fibrillation    History of chicken pox    Humerus distal fracture    left - [place] on 05/21/12   Hyperkeratosis    Pre-ulcerative hyperkeratotic lesions first MTPJs   Hyperlipidemia    Hypertension    Hyperthyroidism    Mycotic toenails    Osteoarthritis    Osteoporosis, post-menopausal    Status post bilateral knee replacement. DEXA 09/2009: t=-1.6 spine, t=-3.1 right fem neck   Paroxysmal tachycardia (HCC)    now in chronic A fib but rate is well controlled   Type II diabetes mellitus (HCC)     Past Surgical History:  Procedure Laterality Date   CHOLECYSTECTOMY     ENDOSCOPIC RETROGRADE CHOLANGIOPANCREATOGRAPHY (ERCP) WITH PROPOFOL N/A 08/25/2019   Procedure: ENDOSCOPIC RETROGRADE CHOLANGIOPANCREATOGRAPHY (ERCP) WITH PROPOFOL;  Surgeon: Lucilla Lame, MD;  Location: [facility] ENDOSCOPY;  Service: Endoscopy;  Laterality: N/A;   ERCP N/A 11/03/2019   Procedure: ENDOSCOPIC RETROGRADE CHOLANGIOPANCREATOGRAPHY (ERCP);  Surgeon: Lucilla Lame, MD;  Location[facility] ENDOSCOPY;  Service: Endoscopy;  Laterality: N/A;   FEMUR FRACTURE SURGERY Left 2010   INTRAMEDULLARY (IM) NAIL INTERTROCHANTERIC Right 07/15/2017   Procedure: INTRAMEDULLARY (IM) NAIL INTERTROCHANTRIC;  Surgeon: Hessie Knows, MD;  Location: [facility] ORS;  Service: Orthopedics;  Laterality: Right;  JOINT REPLACEMENT  2007   knee replacement surgeries   ORIF FIBULA FRACTURE     ORIF HUMERUS FRACTURE Left 05/28/2012   supracondylar distal   REPLACEMENT TOTAL KNEE BILATERAL     THYROIDECTOMY  1975   [place]   TOTAL ABDOMINAL HYSTERECTOMY W/ BILATERAL SALPINGOOPHORECTOMY  1983   [place]   TOTAL KNEE ARTHROPLASTY Bilateral     Social History Social History   Tobacco Use   Smoking status: Former    Types: Cigarettes    Quit date: 02/13/1979    Years since quitting: 43.1   Smokeless tobacco: Never  Vaping Use   Vaping Use: Never used  Substance Use Topics   Alcohol use: Yes    Alcohol/week: 14.0 standard drinks  of alcohol    Types: 14 Glasses of wine per week   Drug use: No    Family History Family History  Problem Relation Age of Onset   Colon cancer Mother    Osteoporosis Mother    Arthritis Mother    Early death Mother    Diabetes Father    Hypertension Father    Heart attack Father    Coronary artery disease Father    Early death Father    Heart disease Father    Hyperlipidemia Father    Depression Brother    Diabetes Brother    Mental illness Brother    Asthma Maternal Grandfather    COPD Maternal Grandfather    Asthma Maternal Aunt    COPD Maternal Aunt    Cancer Maternal Aunt     Allergies  Allergen Reactions   Neosporin [Neomycin-Polymyxin-Gramicidin] Hives   Tape     Other reaction(s): Unknown Adhesive bandage   Codeine Rash   Latex Rash   Neomycin-Bacitracin Zn-Polymyx Rash    Other reaction(s): Unknown DERMATOLOGICALS    Penicillins Rash and Other (See Comments)    Has patient had a PCN reaction causing immediate rash, facial/tongue/throat swelling, SOB or lightheadedness with hypotension: Unknown Has patient had a PCN reaction causing severe rash involving mucus membranes or skin necrosis: Unknown Has patient had a PCN reaction that required hospitalization: Unknown Has patient had a PCN reaction occurring within the last 10 years: No If all of the above answers are "NO", then may proceed with Cephalosporin use.    Silicone Rash    bandages     REVIEW OF SYSTEMS (Negative unless checked)  Constitutional: [] Weight loss  [] Fever  [] Chills Cardiac: [] Chest pain   [] Chest pressure   [] Palpitations   [] Shortness of breath when laying flat   [] Shortness of breath with exertion. Vascular:  [] Pain in legs with walking   [x] Pain in legs with standing  [] History of DVT   [] Phlebitis   [x] Swelling in legs   [] Varicose veins   [] Non-healing ulcers Pulmonary:   [] Uses home oxygen   [] Productive cough   [] Hemoptysis   [] Wheeze  [] COPD   [] Asthma Neurologic:   [] Dizziness   [] Seizures   [] History of stroke   [] History of TIA  [] Aphasia   [] Vissual changes   [] Weakness or numbness in arm   [] Weakness or numbness in leg Musculoskeletal:   [] Joint swelling   [] Joint pain   [] Low back pain Hematologic:  [] Easy bruising  [] Easy bleeding   [] Hypercoagulable state   [] Anemic Gastrointestinal:  [] Diarrhea   [] Vomiting  [] Gastroesophageal reflux/heartburn   [] Difficulty swallowing. Genitourinary:  [] Chronic kidney disease   [] Difficult urination  [] Frequent urination   [] Blood in urine Skin:  [] Rashes   [] Ulcers  Psychological:  [] History of anxiety   []  History of major depression.  Physical Examination  There were no vitals filed for this visit. There is no height or weight on file to calculate BMI. Gen: WD/WN, NAD Head: [place]/AT, No temporalis wasting.  Ear/Nose/Throat: Hearing grossly intact, nares w/o erythema or drainage, pinna without lesions Eyes: PER, EOMI, sclera nonicteric.  Neck: Supple, no gross masses.  No JVD.  Pulmonary:  Good air movement, no audible wheezing, no use of accessory muscles.  Cardiac: RRR, precordium not hyperdynamic. Vascular:  scattered varicosities present bilaterally.  Mild venous stasis changes to the legs bilaterally.  3-4+ soft pitting edema, CEAP [id number]  Vessel Right Left  Radial Palpable Palpable  Gastrointestinal: soft, non-distended. No guarding/no peritoneal signs.  Musculoskeletal: M/S 5/5 throughout.  No deformity.  Neurologic: CN 2-12 intact. Pain and light touch intact in extremities.  Symmetrical.  Speech is fluent. Motor exam as listed above. Psychiatric: Judgment intact, Mood & affect appropriate for pt's clinical situation. Dermatologic: Venous rashes no ulcers noted.  No changes consistent with cellulitis. Lymph : No lichenification or skin changes of chronic lymphedema.  CBC Lab Results  Component Value Date   WBC 9.6 02/07/2020   HGB 8.0 (L) 02/07/2020   HCT 25.7 (L) 02/07/2020   MCV 86.5  02/07/2020   PLT 224 02/07/2020    BMET    Component Value Date/Time   NA 137 02/07/2020 0543   NA 136 05/27/2012 0917   K 3.4 (L) 02/07/2020 0543   K 4.0 05/27/2012 0917   CL 104 02/07/2020 0543   CL 102 05/27/2012 0917   CO2 24 02/07/2020 0543   CO2 28 05/27/2012 0917   GLUCOSE 155 (H) 02/07/2020 0543   GLUCOSE 140 (H) 05/27/2012 0917   BUN 22 02/07/2020 0543   BUN 13 05/27/2012 0917   CREATININE 0.67 02/07/2020 0543   CREATININE 0.58 (L) 05/27/2012 0917   CALCIUM 8.7 (L) 02/07/2020 0543   CALCIUM 9.3 05/27/2012 0917   GFRNONAA >60 02/07/2020 0543   GFRNONAA >60 05/27/2012 0917   GFRAA >60 08/27/2019 0718   GFRAA >60 05/27/2012 0917   CrCl cannot be calculated (Patient's most recent lab result is older than the maximum 21 days allowed.).  COAG Lab Results  Component Value Date   INR 1.3 (H) 02/02/2020   INR 1.2 08/23/2019   INR 1.08 07/19/2016    Radiology No results found.   Assessment/Plan There are no diagnoses linked to this encounter.   Hortencia Pilar, MD  03/20/2022 8:56 PM

## 2022-03-22 ENCOUNTER — Ambulatory Visit (INDEPENDENT_AMBULATORY_CARE_PROVIDER_SITE_OTHER): Payer: Medicare Other | Admitting: Vascular Surgery

## 2022-03-22 DIAGNOSIS — E11622 Type 2 diabetes mellitus with other skin ulcer: Secondary | ICD-10-CM

## 2022-03-22 DIAGNOSIS — I872 Venous insufficiency (chronic) (peripheral): Secondary | ICD-10-CM

## 2022-03-22 DIAGNOSIS — I4819 Other persistent atrial fibrillation: Secondary | ICD-10-CM

## 2022-03-22 DIAGNOSIS — I890 Lymphedema, not elsewhere classified: Secondary | ICD-10-CM

## 2022-03-22 DIAGNOSIS — I10 Essential (primary) hypertension: Secondary | ICD-10-CM

## 2022-03-28 NOTE — Progress Notes (Signed)
MRN : [id number]  Bethany Joseph is a 77 y.o. (1945/06/24) female who presents with chief complaint of legs hurt and swell.  History of Present Illness:   Patient is seen for follow up evaluation of leg pain and swelling associated with moderate to severe venous stasis changes.  For the most part she reports that the area of irritation under the zipper has remained healed.  She has been very compliant with her 20-30 graduated compression.  She is wearing her stockings on a daily basis.   The patient states that she continues to elevate as much as possible. The patient denies any recent changes in medications.   The patient denies a history of DVT or PE. There is no prior history of phlebitis. There is no history of primary lymphedema.   No SOB or increased cough.  No sputum production.  No recent episodes of CHF exacerbation.   No outpatient medications have been marked as taking for the 03/29/22 encounter (Appointment) with Delana Meyer, Dolores Lory, MD.    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   Diabetes ([place])    Diabetic neuropathy ([place])    unspecified   Difficult intubation    has a difficult intubation card   Fracture of distal femur ([place])    left periprosthetic, status post ORIF   GERD (gastroesophageal reflux disease)    Gout    Heart murmur    History of atrial fibrillation    History of chicken pox    Humerus distal fracture    left - [place] on  05/21/12   Hyperkeratosis    Pre-ulcerative hyperkeratotic lesions first MTPJs   Hyperlipidemia    Hypertension    Hyperthyroidism    Mycotic toenails    Osteoarthritis    Osteoporosis, post-menopausal    Status post bilateral knee replacement. DEXA 09/2009: t=-1.6 spine, t=-3.1 right fem neck   Paroxysmal tachycardia (HCC)    now in chronic A fib but rate is well controlled   Type II diabetes mellitus (HCC)     Past Surgical History:  Procedure Laterality Date   CHOLECYSTECTOMY     ENDOSCOPIC RETROGRADE CHOLANGIOPANCREATOGRAPHY (ERCP) WITH PROPOFOL N/A 08/25/2019   Procedure: ENDOSCOPIC RETROGRADE CHOLANGIOPANCREATOGRAPHY (ERCP) WITH PROPOFOL;  Surgeon: Lucilla Lame, MD;  Location: [facility] ENDOSCOPY;  Service: Endoscopy;  Laterality: N/A;   ERCP N/A 11/03/2019   Procedure: ENDOSCOPIC RETROGRADE CHOLANGIOPANCREATOGRAPHY (ERCP);  Surgeon: Lucilla Lame, MD;  Location: [facility] ENDOSCOPY;  Service: Endoscopy;  Laterality: N/A;   FEMUR FRACTURE SURGERY Left 2010   INTRAMEDULLARY (IM) NAIL INTERTROCHANTERIC Right 07/15/2017   Procedure: INTRAMEDULLARY (IM) NAIL INTERTROCHANTRIC;  Surgeon: Hessie Knows, MD;  Location: [facility] ORS;  Service: Orthopedics;  Laterality: Right;   JOINT REPLACEMENT  2007   knee replacement surgeries   ORIF FIBULA FRACTURE     ORIF  HUMERUS FRACTURE Left 05/28/2012   supracondylar distal   REPLACEMENT TOTAL KNEE BILATERAL     THYROIDECTOMY  1975   [place]   TOTAL ABDOMINAL HYSTERECTOMY W/ BILATERAL SALPINGOOPHORECTOMY  1983   [place]   TOTAL KNEE ARTHROPLASTY Bilateral     Social History Social History   Tobacco Use   Smoking status: Former    Types: Cigarettes    Quit date: 02/13/1979    Years since quitting: 43.1   Smokeless tobacco: Never  Vaping Use   Vaping Use: Never used  Substance Use Topics   Alcohol use: Yes    Alcohol/week: 14.0 standard drinks of alcohol    Types: 14 Glasses of wine per week   Drug use: No    Family History Family History  Problem  Relation Age of Onset   Colon cancer Mother    Osteoporosis Mother    Arthritis Mother    Early death Mother    Diabetes Father    Hypertension Father    Heart attack Father    Coronary artery disease Father    Early death Father    Heart disease Father    Hyperlipidemia Father    Depression Brother    Diabetes Brother    Mental illness Brother    Asthma Maternal Grandfather    COPD Maternal Grandfather    Asthma Maternal Aunt    COPD Maternal Aunt    Cancer Maternal Aunt     Allergies  Allergen Reactions   Neosporin [Neomycin-Polymyxin-Gramicidin] Hives   Tape     Other reaction(s): Unknown Adhesive bandage   Codeine Rash   Latex Rash   Neomycin-Bacitracin Zn-Polymyx Rash    Other reaction(s): Unknown DERMATOLOGICALS    Penicillins Rash and Other (See Comments)    Has patient had a PCN reaction causing immediate rash, facial/tongue/throat swelling, SOB or lightheadedness with hypotension: Unknown Has patient had a PCN reaction causing severe rash involving mucus membranes or skin necrosis: Unknown Has patient had a PCN reaction that required hospitalization: Unknown Has patient had a PCN reaction occurring within the last 10 years: No If all of the above answers are "NO", then may proceed with Cephalosporin use.    Silicone Rash    bandages     REVIEW OF SYSTEMS (Negative unless checked)  Constitutional: []$ Weight loss  []$ Fever  []$ Chills Cardiac: []$ Chest pain   []$ Chest pressure   []$ Palpitations   []$ Shortness of breath when laying flat   []$ Shortness of breath with exertion. Vascular:  []$ Pain in legs with walking   [x]$ Pain in legs at rest  []$ History of DVT   []$ Phlebitis   [x]$ Swelling in legs   []$ Varicose veins   []$ Non-healing ulcers Pulmonary:   []$ Uses home oxygen   []$ Productive cough   []$ Hemoptysis   []$ Wheeze  []$ COPD   []$ Asthma Neurologic:  []$ Dizziness   []$ Seizures   []$ History of stroke   []$ History of TIA  []$ Aphasia   []$ Vissual changes   []$ Weakness or numbness in  arm   []$ Weakness or numbness in leg Musculoskeletal:   []$ Joint swelling   []$ Joint pain   []$ Low back pain Hematologic:  []$ Easy bruising  []$ Easy bleeding   []$ Hypercoagulable state   []$ Anemic Gastrointestinal:  []$ Diarrhea   []$ Vomiting  []$ Gastroesophageal reflux/heartburn   []$ Difficulty swallowing. Genitourinary:  []$ Chronic kidney disease   []$ Difficult urination  []$ Frequent urination   []$ Blood in urine Skin:  []$ Rashes   []$ Ulcers  Psychological:  []$ History of anxiety   []$  History of major depression.  Physical Examination  There were  no vitals filed for this visit. There is no height or weight on file to calculate BMI. Gen: WD/WN, NAD Head: [place]/AT, No temporalis wasting.  Ear/Nose/Throat: Hearing grossly intact, nares w/o erythema or drainage, pinna without lesions Eyes: PER, EOMI, sclera nonicteric.  Neck: Supple, no gross masses.  No JVD.  Pulmonary:  Good air movement, no audible wheezing, no use of accessory muscles.  Cardiac: RRR, precordium not hyperdynamic. Vascular:  scattered varicosities present bilaterally.  Moderate venous stasis changes to the legs bilaterally.  Previous ulcer is healed.  2+ soft pitting edema  Vessel Right Left  Radial Palpable Palpable  Gastrointestinal: soft, non-distended. No guarding/no peritoneal signs.  Musculoskeletal: M/S 5/5 throughout.  No deformity.  Neurologic: CN 2-12 intact. Pain and light touch intact in extremities.  Symmetrical.  Speech is fluent. Motor exam as listed above. Psychiatric: Judgment intact, Mood & affect appropriate for pt's clinical situation. Dermatologic: Venous rashes no ulcers noted.  No changes consistent with cellulitis. Lymph : No lichenification or skin changes of chronic lymphedema.  CBC Lab Results  Component Value Date   WBC 9.6 02/07/2020   HGB 8.0 (L) 02/07/2020   HCT 25.7 (L) 02/07/2020   MCV 86.5 02/07/2020   PLT 224 02/07/2020    BMET    Component Value Date/Time   NA 137 02/07/2020 0543   NA 136  05/27/2012 0917   K 3.4 (L) 02/07/2020 0543   K 4.0 05/27/2012 0917   CL 104 02/07/2020 0543   CL 102 05/27/2012 0917   CO2 24 02/07/2020 0543   CO2 28 05/27/2012 0917   GLUCOSE 155 (H) 02/07/2020 0543   GLUCOSE 140 (H) 05/27/2012 0917   BUN 22 02/07/2020 0543   BUN 13 05/27/2012 0917   CREATININE 0.67 02/07/2020 0543   CREATININE 0.58 (L) 05/27/2012 0917   CALCIUM 8.7 (L) 02/07/2020 0543   CALCIUM 9.3 05/27/2012 0917   GFRNONAA >60 02/07/2020 0543   GFRNONAA >60 05/27/2012 0917   GFRAA >60 08/27/2019 0718   GFRAA >60 05/27/2012 0917   CrCl cannot be calculated (Patient's most recent lab result is older than the maximum 21 days allowed.).  COAG Lab Results  Component Value Date   INR 1.3 (H) 02/02/2020   INR 1.2 08/23/2019   INR 1.08 07/19/2016    Radiology No results found.   Assessment/Plan 1. Chronic venous insufficiency Recommend:  No surgery or intervention at this point in time.    I have reviewed my discussion with the patient regarding lymphedema and why it  causes symptoms.  Patient will continue wearing graduated compression on a daily basis. The patient should put the compression on first thing in the morning and removing them in the evening. The patient should not sleep in the compression.   In addition, behavioral modification throughout the day will be continued.  This will include frequent elevation (such as in a recliner), use of over the counter pain medications as needed and exercise such as walking.  The systemic causes for chronic edema such as liver, kidney and cardiac etiologies does not appear to have significant changed over the past year.    The patient will continue aggressive use of the  lymph pump.  This will continue to improve the edema control and prevent sequela such as ulcers and infections.   The patient will follow-up with me on an annual basis.   2. Lymphedema Recommend:  No surgery or intervention at this point in time.    I  have reviewed my  discussion with the patient regarding lymphedema and why it  causes symptoms.  Patient will continue wearing graduated compression on a daily basis. The patient should put the compression on first thing in the morning and removing them in the evening. The patient should not sleep in the compression.   In addition, behavioral modification throughout the day will be continued.  This will include frequent elevation (such as in a recliner), use of over the counter pain medications as needed and exercise such as walking.  The systemic causes for chronic edema such as liver, kidney and cardiac etiologies does not appear to have significant changed over the past year.    The patient will continue aggressive use of the  lymph pump.  This will continue to improve the edema control and prevent sequela such as ulcers and infections.   The patient will follow-up with me on an annual basis.   3. Essential hypertension Continue antihypertensive medications as already ordered, these medications have been reviewed and there are no changes at this time.  4. Persistent atrial fibrillation (HCC) Continue antiarrhythmia medications as already ordered, these medications have been reviewed and there are no changes at this time.  Continue anticoagulation as ordered by Cardiology Service  5. Type 2 diabetes mellitus with other skin ulcer, without long-term current use of insulin (HCC) Continue hypoglycemic medications as already ordered, these medications have been reviewed and there are no changes at this time.  Hgb A1C to be monitored as already arranged by primary service    Hortencia Pilar, MD  03/28/2022 3:17 PM

## 2022-03-29 ENCOUNTER — Encounter (INDEPENDENT_AMBULATORY_CARE_PROVIDER_SITE_OTHER): Payer: Self-pay | Admitting: Vascular Surgery

## 2022-03-29 ENCOUNTER — Ambulatory Visit (INDEPENDENT_AMBULATORY_CARE_PROVIDER_SITE_OTHER): Payer: Medicare Other | Admitting: Vascular Surgery

## 2022-03-29 VITALS — BP 120/74 | HR 72 | Resp 18 | Ht 68.0 in | Wt 199.0 lb

## 2022-03-29 DIAGNOSIS — I10 Essential (primary) hypertension: Secondary | ICD-10-CM

## 2022-03-29 DIAGNOSIS — I872 Venous insufficiency (chronic) (peripheral): Secondary | ICD-10-CM

## 2022-03-29 DIAGNOSIS — I890 Lymphedema, not elsewhere classified: Secondary | ICD-10-CM

## 2022-03-29 DIAGNOSIS — I4819 Other persistent atrial fibrillation: Secondary | ICD-10-CM | POA: Diagnosis not present

## 2022-03-29 DIAGNOSIS — E11622 Type 2 diabetes mellitus with other skin ulcer: Secondary | ICD-10-CM

## 2022-04-01 ENCOUNTER — Encounter (INDEPENDENT_AMBULATORY_CARE_PROVIDER_SITE_OTHER): Payer: Self-pay | Admitting: Vascular Surgery

## 2022-06-25 ENCOUNTER — Ambulatory Visit (INDEPENDENT_AMBULATORY_CARE_PROVIDER_SITE_OTHER): Payer: Medicare Other | Admitting: Vascular Surgery

## 2022-06-25 ENCOUNTER — Encounter (INDEPENDENT_AMBULATORY_CARE_PROVIDER_SITE_OTHER): Payer: Self-pay | Admitting: Vascular Surgery

## 2022-06-25 VITALS — BP 139/74 | HR 78 | Resp 16

## 2022-06-25 DIAGNOSIS — I872 Venous insufficiency (chronic) (peripheral): Secondary | ICD-10-CM

## 2022-06-25 DIAGNOSIS — I4819 Other persistent atrial fibrillation: Secondary | ICD-10-CM

## 2022-06-25 DIAGNOSIS — J439 Emphysema, unspecified: Secondary | ICD-10-CM

## 2022-06-25 DIAGNOSIS — E11622 Type 2 diabetes mellitus with other skin ulcer: Secondary | ICD-10-CM

## 2022-06-25 DIAGNOSIS — I10 Essential (primary) hypertension: Secondary | ICD-10-CM

## 2022-06-25 DIAGNOSIS — I890 Lymphedema, not elsewhere classified: Secondary | ICD-10-CM | POA: Diagnosis not present

## 2022-06-25 NOTE — Progress Notes (Signed)
MRN : [id number]  Bethany Joseph is a 77 y.o. ([birth year]) female who presents with chief complaint of legs swell.  History of Present Illness:   Patient is seen for follow up evaluation of leg pain and swelling associated with moderate to severe venous stasis changes.  She feels she has been doing very well since her last office visit.  For the most part she reports that the area of irritation under the zipper has remained healed.  She has been very compliant with her 20-30 graduated compression.  She is wearing her stockings on a daily basis.   The patient states that she continues to elevate as much as possible. The patient denies any recent changes in medications.   The patient denies a history of DVT or PE. There is no prior history of phlebitis. There is no history of primary lymphedema.   No SOB or increased cough.  No sputum production.  No recent episodes of CHF exacerbation.   No outpatient medications have been marked as taking for the 06/25/22 encounter (Appointment) with Gilda Crease, Latina Craver, MD.    Past Medical History:  Diagnosis Date   Acquired hypothyroidism    Diagnosed with peripheral resistance   Allergic rhinitis    Aortic valve disorder    Arrhythmia    Arthritis    Asthma    Asthma without status asthmaticus    unspecified   At risk for falls    uses cane, arthritis   Atrial fibrillation (HCC)    Permanent at this time CHADSVASc=3 (age 21, DM, female) 05/30/09 - Afib at 107 4/25 A fib 88 with better rate control on Multaq, but dc Multaq since in permanent A Fib, refuses Coumadin therapy    Avascular necrosis (HCC)    left foot   Diabetes (HCC)    Diabetic neuropathy (HCC)    unspecified   Difficult intubation    has a difficult intubation card   Fracture of distal femur (HCC)    left periprosthetic, status post ORIF   GERD (gastroesophageal reflux disease)    Gout    Heart murmur    History of atrial fibrillation     History of chicken pox    Humerus distal fracture    left - [place] on 05/21/12   Hyperkeratosis    Pre-ulcerative hyperkeratotic lesions first MTPJs   Hyperlipidemia    Hypertension    Hyperthyroidism    Mycotic toenails    Osteoarthritis    Osteoporosis, post-menopausal    Status post bilateral knee replacement. DEXA 09/2009: t=-1.6 spine, t=-3.1 right fem neck   Paroxysmal tachycardia (HCC)    now in chronic A fib but rate is well controlled   Type II diabetes mellitus (HCC)     Past Surgical History:  Procedure Laterality Date   CHOLECYSTECTOMY     ENDOSCOPIC RETROGRADE CHOLANGIOPANCREATOGRAPHY (ERCP) WITH PROPOFOL N/A 08/25/2019   Procedure: ENDOSCOPIC RETROGRADE CHOLANGIOPANCREATOGRAPHY (ERCP) WITH PROPOFOL;  Surgeon: Midge Minium, MD;  Location: [facility] ENDOSCOPY;  Service: Endoscopy;  Laterality: N/A;   ERCP N/A 11/03/2019   Procedure: ENDOSCOPIC RETROGRADE CHOLANGIOPANCREATOGRAPHY (ERCP);  Surgeon: Midge Minium, MD;  Location: [facility] ENDOSCOPY;  Service: Endoscopy;  Laterality: N/A;   FEMUR FRACTURE SURGERY Left 2010   INTRAMEDULLARY (IM) NAIL INTERTROCHANTERIC Right 07/15/2017   Procedure: INTRAMEDULLARY (IM) NAIL INTERTROCHANTRIC;  Surgeon: Kennedy Bucker, MD;  Location: [facility] ORS;  Service: Orthopedics;  Laterality: Right;   JOINT REPLACEMENT  2007   knee replacement surgeries   ORIF FIBULA FRACTURE     ORIF HUMERUS FRACTURE Left 05/28/2012   supracondylar distal   REPLACEMENT TOTAL KNEE BILATERAL     THYROIDECTOMY  1975   [place]   TOTAL ABDOMINAL HYSTERECTOMY W/ BILATERAL SALPINGOOPHORECTOMY  1983   [place]   TOTAL KNEE ARTHROPLASTY Bilateral     Social History Social History   Tobacco Use   Smoking status: Former    Types: Cigarettes    Quit date: 02/13/1979    Years since quitting: 43.3   Smokeless tobacco: Never  Vaping Use   Vaping Use: Never used  Substance Use Topics   Alcohol use: Yes    Alcohol/week: 14.0 standard drinks of alcohol    Types: 14 Glasses of wine  per week   Drug use: No    Family History Family History  Problem Relation Age of Onset   Colon cancer Mother    Osteoporosis Mother    Arthritis Mother    Early death Mother    Diabetes Father    Hypertension Father    Heart attack Father    Coronary artery disease Father    Early death Father    Heart disease Father    Hyperlipidemia Father    Depression Brother    Diabetes Brother    Mental illness Brother    Asthma Maternal Grandfather    COPD Maternal Grandfather    Asthma Maternal Aunt    COPD Maternal Aunt    Cancer Maternal Aunt     Allergies  Allergen Reactions   Neosporin [Neomycin-Polymyxin-Gramicidin] Hives   Tape     Other reaction(s): Unknown Adhesive bandage   Codeine Rash   Latex Rash   Neomycin-Bacitracin Zn-Polymyx Rash    Other reaction(s): Unknown DERMATOLOGICALS    Penicillins Rash and Other (See Comments)    Has patient had a PCN reaction causing immediate rash, facial/tongue/throat swelling, SOB or lightheadedness with hypotension: Unknown Has patient had a PCN reaction causing severe rash involving mucus membranes or skin necrosis: Unknown Has patient had a PCN reaction that required hospitalization: Unknown Has patient had a PCN reaction occurring within the last 10 years: No If all of the above answers are "NO", then may proceed with Cephalosporin use.    Silicone Rash    bandages     REVIEW OF SYSTEMS (Negative unless checked)  Constitutional: [] Weight loss  [] Fever  [] Chills Cardiac: [] Chest pain   [] Chest pressure   [] Palpitations   [] Shortness of breath when laying flat   [] Shortness of breath with exertion. Vascular:  [] Pain in legs with walking   [x] Pain in legs with standing  [] History of DVT   [] Phlebitis   [x] Swelling in legs   [] Varicose veins   [] Non-healing ulcers Pulmonary:   [] Uses home oxygen   [] Productive cough   [] Hemoptysis   [] Wheeze  [x] COPD   [] Asthma Neurologic:  [] Dizziness   [] Seizures   [] History of stroke    [] History of TIA  [] Aphasia   [] Vissual changes   [] Weakness or numbness in arm   [] Weakness or numbness in leg Musculoskeletal:   [] Joint swelling   [] Joint pain   [] Low back pain Hematologic:  [] Easy bruising  [] Easy bleeding   [] Hypercoagulable state   [] Anemic Gastrointestinal:  [] Diarrhea   [] Vomiting  [] Gastroesophageal reflux/heartburn   [] Difficulty swallowing. Genitourinary:  [] Chronic kidney disease   [] Difficult urination  [] Frequent urination   []   Blood in urine Skin:  [] Rashes   [] Ulcers  Psychological:  [] History of anxiety   []  History of major depression.  Physical Examination  There were no vitals filed for this visit. There is no height or weight on file to calculate BMI. Gen: WD/WN, NAD Head: [place]/AT, No temporalis wasting.  Ear/Nose/Throat: Hearing grossly intact, nares w/o erythema or drainage, pinna without lesions Eyes: PER, EOMI, sclera nonicteric.  Neck: Supple, no gross masses.  No JVD.  Pulmonary:  Good air movement, no audible wheezing, no use of accessory muscles.  Cardiac: RRR, precordium not hyperdynamic. Vascular:  scattered varicosities present bilaterally.  severe venous stasis changes to the legs bilaterally.  1-2+ firm non-pitting edema, CEAP C4sEpAsPr  Vessel Right Left  Radial Palpable Palpable  Gastrointestinal: soft, non-distended. No guarding/no peritoneal signs.  Musculoskeletal: M/S 5/5 throughout.  No deformity.  Neurologic: CN 2-12 intact. Pain and light touch intact in extremities.  Symmetrical.  Speech is fluent. Motor exam as listed above. Psychiatric: Judgment intact, Mood & affect appropriate for pt's clinical situation. Dermatologic: Venous rashes no ulcers noted.  No changes consistent with cellulitis. Lymph : No lichenification or skin changes of chronic lymphedema.  CBC Lab Results  Component Value Date   WBC 9.6 02/07/2020   HGB 8.0 (L) 02/07/2020   HCT 25.7 (L) 02/07/2020   MCV 86.5 02/07/2020   PLT 224 02/07/2020    BMET     Component Value Date/Time   NA 137 02/07/2020 0543   NA 136 05/27/2012 0917   K 3.4 (L) 02/07/2020 0543   K 4.0 05/27/2012 0917   CL 104 02/07/2020 0543   CL 102 05/27/2012 0917   CO2 24 02/07/2020 0543   CO2 28 05/27/2012 0917   GLUCOSE 155 (H) 02/07/2020 0543   GLUCOSE 140 (H) 05/27/2012 0917   BUN 22 02/07/2020 0543   BUN 13 05/27/2012 0917   CREATININE 0.67 02/07/2020 0543   CREATININE 0.58 (L) 05/27/2012 0917   CALCIUM 8.7 (L) 02/07/2020 0543   CALCIUM 9.3 05/27/2012 0917   GFRNONAA >60 02/07/2020 0543   GFRNONAA >60 05/27/2012 0917   GFRAA >60 08/27/2019 0718   GFRAA >60 05/27/2012 0917   CrCl cannot be calculated (Patient's most recent lab result is older than the maximum 21 days allowed.).  COAG Lab Results  Component Value Date   INR 1.3 (H) 02/02/2020   INR 1.2 08/23/2019   INR 1.08 07/19/2016    Radiology No results found.   Assessment/Plan 1. Lymphedema No surgery or intervention at this point in time.   The patient is CEAP [id number]   I have discussed with the patient venous insufficiency and why it  causes symptoms. I have discussed with the patient the chronic skin changes that accompany venous insufficiency and the long term sequela such as infection and ulceration.  Patient will begin wearing graduated compression stockings or compression wraps on a daily basis.  The patient will put the compression on first thing in the morning and removing them in the evening. The patient is instructed specifically not to sleep in the compression.    In addition, behavioral modification including several periods of elevation of the lower extremities during the day will be continued. I have demonstrated that proper elevation is a position with the ankles at heart level.  The patient is instructed to begin routine exercise, especially walking on a daily basis   2. Chronic venous insufficiency No surgery or intervention at this point in time.   The patient  is CEAP  C4sEpAsPr   I have discussed with the patient venous insufficiency and why it  causes symptoms. I have discussed with the patient the chronic skin changes that accompany venous insufficiency and the long term sequela such as infection and ulceration.  Patient will begin wearing graduated compression stockings or compression wraps on a daily basis.  The patient will put the compression on first thing in the morning and removing them in the evening. The patient is instructed specifically not to sleep in the compression.    In addition, behavioral modification including several periods of elevation of the lower extremities during the day will be continued. I have demonstrated that proper elevation is a position with the ankles at heart level.  The patient is instructed to begin routine exercise, especially walking on a daily basis  3. Essential hypertension Continue antihypertensive medications as already ordered, these medications have been reviewed and there are no changes at this time.  4. Persistent atrial fibrillation (HCC) Continue antiarrhythmia medications as already ordered, these medications have been reviewed and there are no changes at this time.  Continue anticoagulation as ordered by Cardiology Service  5. Pulmonary emphysema, unspecified emphysema type (HCC) Continue pulmonary medications and aerosols as already ordered, these medications have been reviewed and there are no changes at this time.   6. Type 2 diabetes mellitus with other skin ulcer, without long-term current use of insulin (HCC) Continue hypoglycemic medications as already ordered, these medications have been reviewed and there are no changes at this time.  Hgb A1C to be monitored as already arranged by primary service    Levora Dredge, MD  06/25/2022 1:05 PM

## 2022-07-01 ENCOUNTER — Encounter (INDEPENDENT_AMBULATORY_CARE_PROVIDER_SITE_OTHER): Payer: Self-pay | Admitting: Vascular Surgery

## 2022-12-03 ENCOUNTER — Other Ambulatory Visit
Admission: RE | Admit: 2022-12-03 | Discharge: 2022-12-03 | Disposition: A | Discharge status: Home / Self Care | Payer: Medicare Other | Source: Ambulatory Visit | Attending: Internal Medicine | Admitting: Internal Medicine

## 2022-12-03 DIAGNOSIS — I509 Heart failure, unspecified: Secondary | ICD-10-CM | POA: Diagnosis present

## 2022-12-03 LAB — BRAIN NATRIURETIC PEPTIDE: B Natriuretic Peptide: 124.2 pg/mL — ABNORMAL HIGH (ref 0.0–100.0)

## 2022-12-27 ENCOUNTER — Ambulatory Visit (INDEPENDENT_AMBULATORY_CARE_PROVIDER_SITE_OTHER): Payer: Medicare Other | Admitting: Vascular Surgery

## 2022-12-27 ENCOUNTER — Encounter (INDEPENDENT_AMBULATORY_CARE_PROVIDER_SITE_OTHER): Payer: Self-pay | Admitting: Vascular Surgery

## 2022-12-27 VITALS — BP 126/62 | HR 73 | Resp 16

## 2022-12-27 DIAGNOSIS — E11622 Type 2 diabetes mellitus with other skin ulcer: Secondary | ICD-10-CM

## 2022-12-27 DIAGNOSIS — I890 Lymphedema, not elsewhere classified: Secondary | ICD-10-CM | POA: Diagnosis not present

## 2022-12-27 DIAGNOSIS — I872 Venous insufficiency (chronic) (peripheral): Secondary | ICD-10-CM

## 2022-12-27 DIAGNOSIS — I4819 Other persistent atrial fibrillation: Secondary | ICD-10-CM

## 2022-12-27 DIAGNOSIS — J439 Emphysema, unspecified: Secondary | ICD-10-CM | POA: Diagnosis not present

## 2022-12-27 NOTE — Progress Notes (Signed)
MRN : [id number]  Bethany Joseph is a 77 y.o. (1945/10/25) female who presents with chief complaint of legs hurt and swell.  History of Present Illness:   Patient is seen for follow up evaluation of leg pain and swelling associated with moderate to severe venous stasis changes.  She feels she has been doing very well since her last office visit.  For the most part she reports that the area of irritation under the zipper has remained healed.  She has been very compliant with her 20-30 graduated compression.  She is wearing her stockings on a daily basis.   The patient states that she continues to elevate as much as possible. The patient denies any recent changes in medications.   The patient denies a history of DVT or PE. There is no prior history of phlebitis. There is no history of primary lymphedema.   No SOB or increased cough.  No sputum production.  No recent episodes of CHF exacerbation.   No outpatient medications have been marked as taking for the 12/27/22 encounter (Appointment) with Gilda Crease, Latina Craver, MD.    Past Medical History:  Diagnosis Date   Acquired hypothyroidism    Diagnosed with peripheral resistance   Allergic rhinitis    Aortic valve disorder    Arrhythmia    Arthritis    Asthma    Asthma without status asthmaticus    unspecified   At risk for falls    uses cane, arthritis   Atrial fibrillation (HCC)    Permanent at this time CHADSVASc=3 (age 77, DM, female) 05/30/09 - Afib at 107 4/25 A fib 88 with better rate control on Multaq, but dc Multaq since in permanent A Fib, refuses Coumadin therapy    Avascular necrosis (HCC)    left foot   Diabetes (HCC)    Diabetic neuropathy (HCC)    unspecified   Difficult intubation    has a difficult intubation card   Fracture of distal femur (HCC)    left periprosthetic, status post ORIF   GERD (gastroesophageal reflux disease)    Gout    Heart murmur    History of atrial fibrillation    History  of chicken pox    Humerus distal fracture    left - inury on 05/21/12   Hyperkeratosis    Pre-ulcerative hyperkeratotic lesions first MTPJs   Hyperlipidemia    Hypertension    Hyperthyroidism    Mycotic toenails    Osteoarthritis    Osteoporosis, post-menopausal    Status post bilateral knee replacement. DEXA 09/2009: t=-1.6 spine, t=-3.1 right fem neck   Paroxysmal tachycardia (HCC)    now in chronic A fib but rate is well controlled   Type II diabetes mellitus (HCC)     Past Surgical History:  Procedure Laterality Date   CHOLECYSTECTOMY     ENDOSCOPIC RETROGRADE CHOLANGIOPANCREATOGRAPHY (ERCP) WITH PROPOFOL N/A 08/25/2019   Procedure: ENDOSCOPIC RETROGRADE CHOLANGIOPANCREATOGRAPHY (ERCP) WITH PROPOFOL;  Surgeon: Midge Minium, MD;  Location: [facility] ENDOSCOPY;  Service: Endoscopy;  Laterality: N/A;   ERCP N/A 11/03/2019   Procedure: ENDOSCOPIC RETROGRADE CHOLANGIOPANCREATOGRAPHY (ERCP);  Surgeon: Midge Minium, MD;  Location: [facility] ENDOSCOPY;  Service: Endoscopy;  Laterality: N/A;   FEMUR FRACTURE SURGERY Left 2010   INTRAMEDULLARY (IM) NAIL INTERTROCHANTERIC Right 07/15/2017   Procedure: INTRAMEDULLARY (IM) NAIL INTERTROCHANTRIC;  Surgeon: Kennedy Bucker, MD;  Location: [facility] ORS;  Service: Orthopedics;  Laterality: Right;   JOINT REPLACEMENT  2007   knee replacement  surgeries   ORIF FIBULA FRACTURE     ORIF HUMERUS FRACTURE Left 05/28/2012   supracondylar distal   REPLACEMENT TOTAL KNEE BILATERAL     THYROIDECTOMY  1975   [place]   TOTAL ABDOMINAL HYSTERECTOMY W/ BILATERAL SALPINGOOPHORECTOMY  1983   [place]   TOTAL KNEE ARTHROPLASTY Bilateral     Social History Social History   Tobacco Use   Smoking status: Former    Current packs/day: 0.00    Types: Cigarettes    Quit date: 02/13/1979    Years since quitting: 43.8   Smokeless tobacco: Never  Vaping Use   Vaping status: Never Used  Substance Use Topics   Alcohol use: Yes    Alcohol/week: 14.0 standard drinks of alcohol    Types:  14 Glasses of wine per week   Drug use: No    Family History Family History  Problem Relation Age of Onset   Colon cancer Mother    Osteoporosis Mother    Arthritis Mother    Early death Mother    Diabetes Father    Hypertension Father    Heart attack Father    Coronary artery disease Father    Early death Father    Heart disease Father    Hyperlipidemia Father    Depression Brother    Diabetes Brother    Mental illness Brother    Asthma Maternal Grandfather    COPD Maternal Grandfather    Asthma Maternal Aunt    COPD Maternal Aunt    Cancer Maternal Aunt     Allergies  Allergen Reactions   Neosporin [Neomycin-Polymyxin-Gramicidin] Hives   Tape     Other reaction(s): Unknown Adhesive bandage   Codeine Rash   Latex Rash   Neomycin-Bacitracin Zn-Polymyx Rash    Other reaction(s): Unknown DERMATOLOGICALS    Penicillins Rash and Other (See Comments)    Has patient had a PCN reaction causing immediate rash, facial/tongue/throat swelling, SOB or lightheadedness with hypotension: Unknown Has patient had a PCN reaction causing severe rash involving mucus membranes or skin necrosis: Unknown Has patient had a PCN reaction that required hospitalization: Unknown Has patient had a PCN reaction occurring within the last 10 years: No If all of the above answers are "NO", then may proceed with Cephalosporin use.    Silicone Rash    bandages     REVIEW OF SYSTEMS (Negative unless checked)  Constitutional: [] Weight loss  [] Fever  [] Chills Cardiac: [] Chest pain   [] Chest pressure   [] Palpitations   [] Shortness of breath when laying flat   [] Shortness of breath with exertion. Vascular:  [] Pain in legs with walking   [x] Pain in legs at rest  [] History of DVT   [] Phlebitis   [x] Swelling in legs   [] Varicose veins   [] Non-healing ulcers Pulmonary:   [] Uses home oxygen   [] Productive cough   [] Hemoptysis   [] Wheeze  [] COPD   [] Asthma Neurologic:  [] Dizziness   [] Seizures   [] History  of stroke   [] History of TIA  [] Aphasia   [] Vissual changes   [] Weakness or numbness in arm   [] Weakness or numbness in leg Musculoskeletal:   [] Joint swelling   [] Joint pain   [] Low back pain Hematologic:  [] Easy bruising  [] Easy bleeding   [] Hypercoagulable state   [] Anemic Gastrointestinal:  [] Diarrhea   [] Vomiting  [] Gastroesophageal reflux/heartburn   [] Difficulty swallowing. Genitourinary:  [] Chronic kidney disease   [] Difficult urination  [] Frequent urination   [] Blood in urine Skin:  [] Rashes   [] Ulcers  Psychological:  []   History of anxiety   []  History of major depression.  Physical Examination  There were no vitals filed for this visit. There is no height or weight on file to calculate BMI. Gen: WD/WN, NAD Head: /AT, No temporalis wasting.  Ear/Nose/Throat: Hearing grossly intact, nares w/o erythema or drainage, pinna without lesions Eyes: PER, EOMI, sclera nonicteric.  Neck: Supple, no gross masses.  No JVD.  Pulmonary:  Good air movement, no audible wheezing, no use of accessory muscles.  Cardiac: RRR, precordium not hyperdynamic. Vascular:  scattered varicosities present bilaterally.  Moderate venous stasis changes to the legs bilaterally.  2+ soft pitting edema. CEAP C4sEpAsPr   Vessel Right Left  Radial Palpable Palpable  Gastrointestinal: soft, non-distended. No guarding/no peritoneal signs.  Musculoskeletal: M/S 5/5 throughout.  No deformity.  Neurologic: CN 2-12 intact. Pain and light touch intact in extremities.  Symmetrical.  Speech is fluent. Motor exam as listed above. Psychiatric: Judgment intact, Mood & affect appropriate for pt's clinical situation. Dermatologic: Venous rashes no ulcers noted.  No changes consistent with cellulitis. Lymph : No lichenification or skin changes of chronic lymphedema.  CBC Lab Results  Component Value Date   WBC 9.6 02/07/2020   HGB 8.0 (L) 02/07/2020   HCT 25.7 (L) 02/07/2020   MCV 86.5 02/07/2020   PLT 224 02/07/2020     BMET    Component Value Date/Time   NA 137 02/07/2020 0543   NA 136 05/27/2012 0917   K 3.4 (L) 02/07/2020 0543   K 4.0 05/27/2012 0917   CL 104 02/07/2020 0543   CL 102 05/27/2012 0917   CO2 24 02/07/2020 0543   CO2 28 05/27/2012 0917   GLUCOSE 155 (H) 02/07/2020 0543   GLUCOSE 140 (H) 05/27/2012 0917   BUN 22 02/07/2020 0543   BUN 13 05/27/2012 0917   CREATININE 0.67 02/07/2020 0543   CREATININE 0.58 (L) 05/27/2012 0917   CALCIUM 8.7 (L) 02/07/2020 0543   CALCIUM 9.3 05/27/2012 0917   GFRNONAA >60 02/07/2020 0543   GFRNONAA >60 05/27/2012 0917   GFRAA >60 08/27/2019 0718   GFRAA >60 05/27/2012 0917   CrCl cannot be calculated (Patient's most recent lab result is older than the maximum 21 days allowed.).  COAG Lab Results  Component Value Date   INR 1.3 (H) 02/02/2020   INR 1.2 08/23/2019   INR 1.08 07/19/2016    Radiology No results found.   Assessment/Plan There are no diagnoses linked to this encounter.   Levora Dredge, MD  12/27/2022 12:31 PM

## 2022-12-27 NOTE — Progress Notes (Signed)
MRN : [id number]  Bethany Joseph is a 77 y.o. ([birth year]) female who presents with chief complaint of legs hurt and swell.  History of Present Illness:   Patient is seen for follow up evaluation of leg pain and swelling associated with severe venous stasis changes.  She feels she has been doing okay since her last office visit.  1 significant change has been that her husband underwent carpal tunnel surgery and has not been able to help her putting on the compression socks.  Therefore it has been difficult to maintain compression therapy.  She is countered this by increasing her utilization of the lymphedema pump.  The patient states that she continues to elevate as much as possible. The patient denies any recent changes in medications.  There are 2 areas on her left shin that she is concerned about.  Mostly, she is curious as to what they are.  She is not noticed any inflammatory changes that would be consistent with infection or phlebitis.   The patient denies a history of DVT or PE. There is no prior history of phlebitis.   No SOB or increased cough.  No sputum production.  No recent episodes of CHF exacerbation.   No outpatient medications have been marked as taking for the 12/27/22 encounter (Appointment) with Gilda Crease, Latina Craver, MD.    Past Medical History:  Diagnosis Date   Acquired hypothyroidism    Diagnosed with peripheral resistance   Allergic rhinitis    Aortic valve disorder    Arrhythmia    Arthritis    Asthma    Asthma without status asthmaticus    unspecified   At risk for falls    uses cane, arthritis   Atrial fibrillation (HCC)    Permanent at this time CHADSVASc=3 (age 63, DM, female) 05/30/09 - Afib at 107 4/25 A fib 88 with better rate control on Multaq, but dc Multaq since in permanent A Fib, refuses Coumadin therapy    Avascular necrosis (HCC)    left foot   Diabetes (HCC)    Diabetic neuropathy (HCC)    unspecified   Difficult intubation     has a difficult intubation card   Fracture of distal femur (HCC)    left periprosthetic, status post ORIF   GERD (gastroesophageal reflux disease)    Gout    Heart murmur    History of atrial fibrillation    History of chicken pox    Humerus distal fracture    left - [place] on 05/21/12   Hyperkeratosis    Pre-ulcerative hyperkeratotic lesions first MTPJs   Hyperlipidemia    Hypertension    Hyperthyroidism    Mycotic toenails    Osteoarthritis    Osteoporosis, post-menopausal    Status post bilateral knee replacement. DEXA 09/2009: t=-1.6 spine, t=-3.1 right fem neck   Paroxysmal tachycardia (HCC)    now in chronic A fib but rate is well controlled   Type II diabetes mellitus (HCC)     Past Surgical History:  Procedure Laterality Date   CHOLECYSTECTOMY     ENDOSCOPIC RETROGRADE CHOLANGIOPANCREATOGRAPHY (ERCP) WITH PROPOFOL N/A 08/25/2019   Procedure: ENDOSCOPIC RETROGRADE CHOLANGIOPANCREATOGRAPHY (ERCP) WITH PROPOFOL;  Surgeon: Midge Minium, MD;  Location: [facility] ENDOSCOPY;  Service: Endoscopy;  Laterality: N/A;   ERCP N/A 11/03/2019   Procedure: ENDOSCOPIC RETROGRADE CHOLANGIOPANCREATOGRAPHY (ERCP);  Surgeon: Midge Minium, MD;  Location[facility] ENDOSCOPY;  Service: Endoscopy;  Laterality: N/A;   FEMUR FRACTURE SURGERY Left 2010  INTRAMEDULLARY (IM) NAIL INTERTROCHANTERIC Right 07/15/2017   Procedure: INTRAMEDULLARY (IM) NAIL INTERTROCHANTRIC;  Surgeon: Kennedy Bucker, MD;  Location: [facility] ORS;  Service: Orthopedics;  Laterality: Right;   JOINT REPLACEMENT  2007   knee replacement surgeries   ORIF FIBULA FRACTURE     ORIF HUMERUS FRACTURE Left 05/28/2012   supracondylar distal   REPLACEMENT TOTAL KNEE BILATERAL     THYROIDECTOMY  1975   [place]   TOTAL ABDOMINAL HYSTERECTOMY W/ BILATERAL SALPINGOOPHORECTOMY  1983   [place]   TOTAL KNEE ARTHROPLASTY Bilateral     Social History Social History   Tobacco Use   Smoking status: Former    Current packs/day: 0.00    Types: Cigarettes     Quit date: 02/13/1979    Years since quitting: 43.8   Smokeless tobacco: Never  Vaping Use   Vaping status: Never Used  Substance Use Topics   Alcohol use: Yes    Alcohol/week: 14.0 standard drinks of alcohol    Types: 14 Glasses of wine per week   Drug use: No    Family History Family History  Problem Relation Age of Onset   Colon cancer Mother    Osteoporosis Mother    Arthritis Mother    Early death Mother    Diabetes Father    Hypertension Father    Heart attack Father    Coronary artery disease Father    Early death Father    Heart disease Father    Hyperlipidemia Father    Depression Brother    Diabetes Brother    Mental illness Brother    Asthma Maternal Grandfather    COPD Maternal Grandfather    Asthma Maternal Aunt    COPD Maternal Aunt    Cancer Maternal Aunt     Allergies  Allergen Reactions   Neosporin [Neomycin-Polymyxin-Gramicidin] Hives   Tape     Other reaction(s): Unknown Adhesive bandage   Codeine Rash   Latex Rash   Neomycin-Bacitracin Zn-Polymyx Rash    Other reaction(s): Unknown DERMATOLOGICALS    Penicillins Rash and Other (See Comments)    Has patient had a PCN reaction causing immediate rash, facial/tongue/throat swelling, SOB or lightheadedness with hypotension: Unknown Has patient had a PCN reaction causing severe rash involving mucus membranes or skin necrosis: Unknown Has patient had a PCN reaction that required hospitalization: Unknown Has patient had a PCN reaction occurring within the last 10 years: No If all of the above answers are "NO", then may proceed with Cephalosporin use.    Silicone Rash    bandages     REVIEW OF SYSTEMS (Negative unless checked)  Constitutional: [] Weight loss  [] Fever  [] Chills Cardiac: [] Chest pain   [] Chest pressure   [] Palpitations   [] Shortness of breath when laying flat   [] Shortness of breath with exertion. Vascular:  [] Pain in legs with walking   [x] Pain in legs at rest  [] History of DVT    [] Phlebitis   [x] Swelling in legs   [] Varicose veins   [] Non-healing ulcers Pulmonary:   [] Uses home oxygen   [] Productive cough   [] Hemoptysis   [] Wheeze  [] COPD   [] Asthma Neurologic:  [] Dizziness   [] Seizures   [] History of stroke   [] History of TIA  [] Aphasia   [] Vissual changes   [] Weakness or numbness in arm   [] Weakness or numbness in leg Musculoskeletal:   [] Joint swelling   [] Joint pain   [] Low back pain Hematologic:  [] Easy bruising  [] Easy bleeding   [] Hypercoagulable state   [] Anemic  Gastrointestinal:  [] Diarrhea   [] Vomiting  [] Gastroesophageal reflux/heartburn   [] Difficulty swallowing. Genitourinary:  [] Chronic kidney disease   [] Difficult urination  [] Frequent urination   [] Blood in urine Skin:  [] Rashes   [] Ulcers  Psychological:  [] History of anxiety   []  History of major depression.  Physical Examination  There were no vitals filed for this visit. There is no height or weight on file to calculate BMI. Gen: WD/WN, NAD Head: [place]/AT, No temporalis wasting.  Ear/Nose/Throat: Hearing grossly intact, nares w/o erythema or drainage, pinna without lesions Eyes: PER, EOMI, sclera nonicteric.  Neck: Supple, no gross masses.  No JVD.  Pulmonary:  Good air movement, no audible wheezing, no use of accessory muscles.  Cardiac: RRR, precordium not hyperdynamic. Vascular:  scattered varicosities present bilaterally.  Severe venous stasis changes to the legs bilaterally.  2+ soft pitting edema. CEAP C4sEpAsPr.  The 2 areas of concern are varicosities.  They are nontender to palpation. Vessel Right Left  Radial Palpable Palpable  Gastrointestinal: soft, non-distended. No guarding/no peritoneal signs.  Musculoskeletal: M/S 5/5 throughout.  No deformity.  Neurologic: CN 2-12 intact. Pain and light touch intact in extremities.  Symmetrical.  Speech is fluent. Motor exam as listed above. Psychiatric: Judgment intact, Mood & affect appropriate for pt's clinical situation. Dermatologic: Venous  rashes no ulcers noted.  No changes consistent with cellulitis. Lymph : No lichenification or skin changes of chronic lymphedema.  CBC Lab Results  Component Value Date   WBC 9.6 02/07/2020   HGB 8.0 (L) 02/07/2020   HCT 25.7 (L) 02/07/2020   MCV 86.5 02/07/2020   PLT 224 02/07/2020    BMET    Component Value Date/Time   NA 137 02/07/2020 0543   NA 136 05/27/2012 0917   K 3.4 (L) 02/07/2020 0543   K 4.0 05/27/2012 0917   CL 104 02/07/2020 0543   CL 102 05/27/2012 0917   CO2 24 02/07/2020 0543   CO2 28 05/27/2012 0917   GLUCOSE 155 (H) 02/07/2020 0543   GLUCOSE 140 (H) 05/27/2012 0917   BUN 22 02/07/2020 0543   BUN 13 05/27/2012 0917   CREATININE 0.67 02/07/2020 0543   CREATININE 0.58 (L) 05/27/2012 0917   CALCIUM 8.7 (L) 02/07/2020 0543   CALCIUM 9.3 05/27/2012 0917   GFRNONAA >60 02/07/2020 0543   GFRNONAA >60 05/27/2012 0917   GFRAA >60 08/27/2019 0718   GFRAA >60 05/27/2012 0917   CrCl cannot be calculated (Patient's most recent lab result is older than the maximum 21 days allowed.).  COAG Lab Results  Component Value Date   INR 1.3 (H) 02/02/2020   INR 1.2 08/23/2019   INR 1.08 07/19/2016    Radiology No results found.   Assessment/Plan 1. Lymphedema Recommend:  No surgery or intervention at this point in time.    I have reviewed my discussion with the patient regarding lymphedema and why it  causes symptoms.  Patient will continue wearing graduated compression on a daily basis. The patient should put the compression on first thing in the morning and removing them in the evening. The patient should not sleep in the compression.   Given her current difficulty with her usual 20 to 30 mmHg graduated compression she will continue using the pump on a more frequent basis.  I have also recommended that she try a pair of [place] since he put socks noting that these are mild compression but would be of significant value as compared to not being able to have  anything on  at all.  In addition, behavioral modification throughout the day will be continued.  This will include frequent elevation (such as in a recliner), use of over the counter pain medications as needed and exercise such as walking.  The systemic causes for chronic edema such as liver, kidney and cardiac etiologies does not appear to have significant changed over the past year.    The patient will continue aggressive use of the  lymph pump.  This will continue to improve the edema control and prevent sequela such as ulcers and infections.   The patient will follow-up with me on an annual basis.   2. Chronic venous insufficiency See #1  3. Persistent atrial fibrillation (HCC) Continue antiarrhythmia medications as already ordered, these medications have been reviewed and there are no changes at this time.  Continue anticoagulation as ordered by Cardiology Service  4. Pulmonary emphysema, unspecified emphysema type (HCC) Continue pulmonary medications and aerosols as already ordered, these medications have been reviewed and there are no changes at this time.   5. Type 2 diabetes mellitus with other skin ulcer, without long-term current use of insulin (HCC) Continue hypoglycemic medications as already ordered, these medications have been reviewed and there are no changes at this time.  Hgb A1C to be monitored as already arranged by primary service d   Levora Dredge, MD  12/27/2022 12:31 PM

## 2023-02-26 ENCOUNTER — Telehealth (INDEPENDENT_AMBULATORY_CARE_PROVIDER_SITE_OTHER): Payer: Self-pay

## 2023-02-26 NOTE — Telephone Encounter (Signed)
 Patient called stating her right leg started weeping over night. She would like to know what she should.   Please advise

## 2023-02-26 NOTE — Telephone Encounter (Signed)
 Patient stated that she has been elevating with compressions on. I offered her an appointment and she informed me that it's not safe for her to come out the houswe right now, because of ice on the sidewalk and she's afraid she'll fall. I offered her to call backa nd schedule when everything clears or she could set an appointment next week. She said she'll call back.

## 2023-02-26 NOTE — Telephone Encounter (Signed)
 She should be wearing medical grade compression to help with swelling and utilizing elevation.  If she has done this, and the weeping developed anyway, she should probably have an [employer]  She can be scheduled to see GS (she prefers him)

## 2023-03-01 ENCOUNTER — Encounter (INDEPENDENT_AMBULATORY_CARE_PROVIDER_SITE_OTHER): Payer: Self-pay

## 2023-03-01 ENCOUNTER — Ambulatory Visit (INDEPENDENT_AMBULATORY_CARE_PROVIDER_SITE_OTHER): Payer: Medicare Other | Admitting: Nurse Practitioner

## 2023-03-01 VITALS — BP 138/78 | HR 95 | Wt 199.0 lb

## 2023-03-01 DIAGNOSIS — I890 Lymphedema, not elsewhere classified: Secondary | ICD-10-CM | POA: Diagnosis not present

## 2023-03-01 NOTE — Progress Notes (Unsigned)
History of Present Illness  There is no documented history at this time  Assessments & Plan   There are no diagnoses linked to this encounter.    Additional instructions  Subjective:  Patient presents with venous ulcer of the Right lower extremity.    Procedure:  3 layer unna wrap was placed Right lower extremity.   Plan:   Follow up in one week.   

## 2023-03-08 ENCOUNTER — Encounter (INDEPENDENT_AMBULATORY_CARE_PROVIDER_SITE_OTHER): Payer: Self-pay

## 2023-03-08 ENCOUNTER — Ambulatory Visit (INDEPENDENT_AMBULATORY_CARE_PROVIDER_SITE_OTHER): Payer: Medicare Other | Admitting: Nurse Practitioner

## 2023-03-08 VITALS — BP 124/70 | HR 74 | Resp 18 | Ht 68.0 in | Wt 199.0 lb

## 2023-03-08 DIAGNOSIS — I890 Lymphedema, not elsewhere classified: Secondary | ICD-10-CM

## 2023-03-08 NOTE — Progress Notes (Unsigned)
History of Present Illness  There is no documented history at this time  Assessments & Plan   There are no diagnoses linked to this encounter.    Additional instructions  Subjective:  Patient presents with venous ulcer of the Right lower extremity.    Procedure:  3 layer unna wrap was placed Right lower extremity.   Plan:   Follow up in one week.

## 2023-03-13 ENCOUNTER — Telehealth (INDEPENDENT_AMBULATORY_CARE_PROVIDER_SITE_OTHER): Payer: Self-pay

## 2023-03-13 NOTE — Telephone Encounter (Signed)
Patient unna wraps got wet while taking a shower. They are wondering what they should do.  Please advise

## 2023-03-13 NOTE — Telephone Encounter (Signed)
Per Vivia Birmingham remove unna wraps and come in Friday for regular appt, to get rewrapped.

## 2023-03-15 ENCOUNTER — Encounter (INDEPENDENT_AMBULATORY_CARE_PROVIDER_SITE_OTHER): Payer: Self-pay

## 2023-03-15 ENCOUNTER — Ambulatory Visit (INDEPENDENT_AMBULATORY_CARE_PROVIDER_SITE_OTHER): Payer: Medicare Other | Admitting: Nurse Practitioner

## 2023-03-15 VITALS — BP 135/79 | HR 75 | Resp 16

## 2023-03-15 DIAGNOSIS — I890 Lymphedema, not elsewhere classified: Secondary | ICD-10-CM

## 2023-03-15 NOTE — Progress Notes (Signed)
Patient came in today for right lower extremity unna wrap. Patient right leg has no oozing or swelling at this time. Patient will come out of wraps today and will keep upcoming appointment as schedule. Patient will continue wearing compression socks daily and remove at night.

## 2023-03-29 ENCOUNTER — Encounter (INDEPENDENT_AMBULATORY_CARE_PROVIDER_SITE_OTHER): Payer: Self-pay

## 2023-03-29 ENCOUNTER — Ambulatory Visit (INDEPENDENT_AMBULATORY_CARE_PROVIDER_SITE_OTHER): Payer: Medicare Other | Admitting: Nurse Practitioner

## 2023-03-29 VITALS — BP 131/77 | HR 85 | Resp 16

## 2023-03-29 DIAGNOSIS — I890 Lymphedema, not elsewhere classified: Secondary | ICD-10-CM

## 2023-03-29 NOTE — Progress Notes (Signed)
History of Present Illness  There is no documented history at this time  Assessments & Plan   There are no diagnoses linked to this encounter.    Additional instructions  Subjective:  Patient presents with venous ulcer of the Right lower extremity.    Procedure:  3 layer unna wrap was placed Right lower extremity.   Plan:   Follow up in one week.

## 2023-03-31 ENCOUNTER — Encounter (INDEPENDENT_AMBULATORY_CARE_PROVIDER_SITE_OTHER): Payer: Self-pay | Admitting: Nurse Practitioner

## 2023-04-02 ENCOUNTER — Telehealth (INDEPENDENT_AMBULATORY_CARE_PROVIDER_SITE_OTHER): Payer: Self-pay

## 2023-04-02 NOTE — Telephone Encounter (Signed)
 That is fine to do unna boots Friday and r/s to see him

## 2023-04-02 NOTE — Telephone Encounter (Signed)
 Patient called stating that with the snow coming she don't think its safe for her to come out in her bed shoes; being that's the only shoes she can wear with wraps. She would like to know if she can reschedule for Friday and reschedule to see Schnier another day.   Please

## 2023-04-04 ENCOUNTER — Ambulatory Visit (INDEPENDENT_AMBULATORY_CARE_PROVIDER_SITE_OTHER): Payer: Medicare Other | Admitting: Vascular Surgery

## 2023-04-08 ENCOUNTER — Encounter (INDEPENDENT_AMBULATORY_CARE_PROVIDER_SITE_OTHER): Payer: Self-pay | Admitting: Nurse Practitioner

## 2023-04-08 ENCOUNTER — Ambulatory Visit (INDEPENDENT_AMBULATORY_CARE_PROVIDER_SITE_OTHER): Payer: Medicare Other | Admitting: Nurse Practitioner

## 2023-04-08 VITALS — BP 133/68 | HR 82 | Resp 18

## 2023-04-08 DIAGNOSIS — I890 Lymphedema, not elsewhere classified: Secondary | ICD-10-CM | POA: Diagnosis not present

## 2023-04-08 NOTE — Progress Notes (Unsigned)
 History of Present Illness  There is no documented history at this time  Assessments & Plan   There are no diagnoses linked to this encounter.    Additional instructions  Subjective:  Patient presents with venous ulcer of the Right lower extremity.    Procedure:  3 layer unna wrap was placed Right lower extremity.   Plan:   Follow up in one week.

## 2023-04-15 ENCOUNTER — Encounter (INDEPENDENT_AMBULATORY_CARE_PROVIDER_SITE_OTHER): Payer: Self-pay | Admitting: Nurse Practitioner

## 2023-04-15 ENCOUNTER — Ambulatory Visit (INDEPENDENT_AMBULATORY_CARE_PROVIDER_SITE_OTHER): Payer: Medicare Other | Admitting: Nurse Practitioner

## 2023-04-15 VITALS — BP 130/76 | HR 70 | Resp 18

## 2023-04-15 DIAGNOSIS — I83013 Varicose veins of right lower extremity with ulcer of ankle: Secondary | ICD-10-CM | POA: Diagnosis not present

## 2023-04-15 DIAGNOSIS — L97319 Non-pressure chronic ulcer of right ankle with unspecified severity: Secondary | ICD-10-CM

## 2023-04-15 NOTE — Progress Notes (Signed)
 History of Present Illness  There is no documented history at this time  Assessments & Plan   There are no diagnoses linked to this encounter.    Additional instructions  Subjective:  Patient presents with venous ulcer of the Right lower extremity.    Procedure:  3 layer unna wrap was placed Right lower extremity.   Plan:   Follow up in one week.

## 2023-04-17 NOTE — Progress Notes (Signed)
 History of Present Illness  There is no documented history at this time  Assessments & Plan   There are no diagnoses linked to this encounter.    Additional instructions  Subjective:  Patient presents with venous ulcer of the Right lower extremity.    Procedure:  3 layer unna wrap was placed Right lower extremity.   Plan:   Follow up in one week.

## 2023-04-22 ENCOUNTER — Ambulatory Visit (INDEPENDENT_AMBULATORY_CARE_PROVIDER_SITE_OTHER): Payer: Medicare Other | Admitting: Nurse Practitioner

## 2023-04-22 ENCOUNTER — Encounter (INDEPENDENT_AMBULATORY_CARE_PROVIDER_SITE_OTHER): Payer: Self-pay

## 2023-04-22 VITALS — BP 138/76 | HR 103 | Resp 18 | Ht 67.0 in | Wt 199.0 lb

## 2023-04-22 DIAGNOSIS — I83013 Varicose veins of right lower extremity with ulcer of ankle: Secondary | ICD-10-CM | POA: Diagnosis not present

## 2023-04-22 DIAGNOSIS — I10 Essential (primary) hypertension: Secondary | ICD-10-CM

## 2023-04-22 DIAGNOSIS — L97301 Non-pressure chronic ulcer of unspecified ankle limited to breakdown of skin: Secondary | ICD-10-CM | POA: Diagnosis not present

## 2023-04-22 DIAGNOSIS — I872 Venous insufficiency (chronic) (peripheral): Secondary | ICD-10-CM

## 2023-04-22 DIAGNOSIS — I4819 Other persistent atrial fibrillation: Secondary | ICD-10-CM

## 2023-04-22 DIAGNOSIS — J439 Emphysema, unspecified: Secondary | ICD-10-CM

## 2023-04-23 DIAGNOSIS — L97909 Non-pressure chronic ulcer of unspecified part of unspecified lower leg with unspecified severity: Secondary | ICD-10-CM | POA: Insufficient documentation

## 2023-04-23 NOTE — Progress Notes (Deleted)
 MRN : [id number]  Bethany Joseph is a 78 y.o. (04-21-1945) female who presents with chief complaint of legs hurt and swell.  History of Present Illness:  Patient is seen for follow up evaluation of leg pain and swelling associated with venous ulceration. The patient was recently seen here and started on Unna boot therapy.  The swelling abruptly became much worse bilaterally and is associated with pain and discoloration. The pain and swelling worsens with prolonged dependency and improves with elevation.  The patient notes that in the morning the legs are better but the leg symptoms worsened throughout the course of the day. The patient has also noted a progressive worsening of the discoloration in the ankle and shin area.   The patient notes that an ulcer has developed acutely without specific trauma and since it occurred it has been very slow to heal.  There is a moderate amount of drainage associated with the open area.  The wound is also painful.  The patient states that they have been elevating as much as possible. The patient denies any recent changes in medications.  The patient denies a history of DVT or PE. There is no prior history of phlebitis. There is no history of primary lymphedema.  No SOB or increased cough.  No sputum production.  No recent episodes of CHF exacerbation.   No outpatient medications have been marked as taking for the 04/25/23 encounter (Appointment) with Gilda Crease, Latina Craver, MD.    Past Medical History:  Diagnosis Date   Acquired hypothyroidism    Diagnosed with peripheral resistance   Allergic rhinitis    Aortic valve disorder    Arrhythmia    Arthritis    Asthma    Asthma without status asthmaticus    unspecified   At risk for falls    uses cane, arthritis   Atrial fibrillation (HCC)    Permanent at this time CHADSVASc=3 (age 34, DM, female) 05/30/09 - Afib at 107 4/25 A fib 88 with better rate control on Multaq, but dc Multaq since  in permanent A Fib, refuses Coumadin therapy    Avascular necrosis (HCC)    left foot   Diabetes (HCC)    Diabetic neuropathy (HCC)    unspecified   Difficult intubation    has a difficult intubation card   Fracture of distal femur (HCC)    left periprosthetic, status post ORIF   GERD (gastroesophageal reflux disease)    Gout    Heart murmur    History of atrial fibrillation    History of chicken pox    Humerus distal fracture    left - inury on 05/21/12   Hyperkeratosis    Pre-ulcerative hyperkeratotic lesions first MTPJs   Hyperlipidemia    Hypertension    Hyperthyroidism    Mycotic toenails    Osteoarthritis    Osteoporosis, post-menopausal    Status post bilateral knee replacement. DEXA 09/2009: t=-1.6 spine, t=-3.1 right fem neck   Paroxysmal tachycardia (HCC)    now in chronic A fib but rate is well controlled   Type II diabetes mellitus (HCC)     Past Surgical History:  Procedure Laterality Date   CHOLECYSTECTOMY     ENDOSCOPIC RETROGRADE CHOLANGIOPANCREATOGRAPHY (ERCP) WITH PROPOFOL N/A 08/25/2019   Procedure: ENDOSCOPIC RETROGRADE CHOLANGIOPANCREATOGRAPHY (ERCP) WITH PROPOFOL;  Surgeon: Midge Minium, MD;  Location: [facility] ENDOSCOPY;  Service: Endoscopy;  Laterality: N/A;   ERCP N/A 11/03/2019   Procedure: ENDOSCOPIC RETROGRADE CHOLANGIOPANCREATOGRAPHY (ERCP);  Surgeon: Midge Minium, MD;  Location: [facility] ENDOSCOPY;  Service: Endoscopy;  Laterality: N/A;   FEMUR FRACTURE SURGERY Left 2010   INTRAMEDULLARY (IM) NAIL INTERTROCHANTERIC Right 07/15/2017   Procedure: INTRAMEDULLARY (IM) NAIL INTERTROCHANTRIC;  Surgeon: Kennedy Bucker, MD;  Location: [facility] ORS;  Service: Orthopedics;  Laterality: Right;   JOINT REPLACEMENT  2007   knee replacement surgeries   ORIF FIBULA FRACTURE     ORIF HUMERUS FRACTURE Left 05/28/2012   supracondylar distal   REPLACEMENT TOTAL KNEE BILATERAL     THYROIDECTOMY  1975   [place]   TOTAL ABDOMINAL HYSTERECTOMY W/ BILATERAL SALPINGOOPHORECTOMY  1983    [place]   TOTAL KNEE ARTHROPLASTY Bilateral     Social History Social History   Tobacco Use   Smoking status: Former    Current packs/day: 0.00    Types: Cigarettes    Quit date: 02/13/1979    Years since quitting: 44.2   Smokeless tobacco: Never  Vaping Use   Vaping status: Never Used  Substance Use Topics   Alcohol use: Yes    Alcohol/week: 14.0 standard drinks of alcohol    Types: 14 Glasses of wine per week   Drug use: No    Family History Family History  Problem Relation Age of Onset   Colon cancer Mother    Osteoporosis Mother    Arthritis Mother    Early death Mother    Diabetes Father    Hypertension Father    Heart attack Father    Coronary artery disease Father    Early death Father    Heart disease Father    Hyperlipidemia Father    Depression Brother    Diabetes Brother    Mental illness Brother    Asthma Maternal Grandfather    COPD Maternal Grandfather    Asthma Maternal Aunt    COPD Maternal Aunt    Cancer Maternal Aunt     Allergies  Allergen Reactions   Neosporin [Neomycin-Polymyxin-Gramicidin] Hives   Tape     Other reaction(s): Unknown Adhesive bandage   Codeine Rash   Latex Rash   Neomycin-Bacitracin Zn-Polymyx Rash    Other reaction(s): Unknown DERMATOLOGICALS    Penicillins Other (See Comments), Rash and Nausea Only    Has patient had a PCN reaction causing immediate rash, facial/tongue/throat swelling, SOB or lightheadedness with hypotension: Unknown  Has patient had a PCN reaction causing severe rash involving mucus membranes or skin necrosis: Unknown  Has patient had a PCN reaction that required hospitalization: Unknown  Has patient had a PCN reaction occurring within the last 10 years: No  If all of the above answers are "NO", then may proceed with Cephalosporin use.   Silicone Rash    bandages     REVIEW OF SYSTEMS (Negative unless checked)  Constitutional: [] Weight loss  [] Fever  [] Chills Cardiac: [] Chest pain    [] Chest pressure   [] Palpitations   [] Shortness of breath when laying flat   [] Shortness of breath with exertion. Vascular:  [] Pain in legs with walking   [x] Pain in legs at rest  [] History of DVT   [] Phlebitis   [x] Swelling in legs   [] Varicose veins   [] Non-healing ulcers Pulmonary:   [] Uses home oxygen   [] Productive cough   [] Hemoptysis   [] Wheeze  [] COPD   [] Asthma Neurologic:  [] Dizziness   [] Seizures   [] History of stroke   [] History of TIA  [] Aphasia   [] Vissual changes   [] Weakness or numbness in arm   [] Weakness or numbness in leg  Musculoskeletal:   [] Joint swelling   [] Joint pain   [] Low back pain Hematologic:  [] Easy bruising  [] Easy bleeding   [] Hypercoagulable state   [] Anemic Gastrointestinal:  [] Diarrhea   [] Vomiting  [] Gastroesophageal reflux/heartburn   [] Difficulty swallowing. Genitourinary:  [] Chronic kidney disease   [] Difficult urination  [] Frequent urination   [] Blood in urine Skin:  [] Rashes   [] Ulcers  Psychological:  [] History of anxiety   []  History of major depression.  Physical Examination  There were no vitals filed for this visit. There is no height or weight on file to calculate BMI. Gen: WD/WN, NAD Head: [place]/AT, No temporalis wasting.  Ear/Nose/Throat: Hearing grossly intact, nares w/o erythema or drainage, pinna without lesions Eyes: PER, EOMI, sclera nonicteric.  Neck: Supple, no gross masses.  No JVD.  Pulmonary:  Good air movement, no audible wheezing, no use of accessory muscles.  Cardiac: RRR, precordium not hyperdynamic. Vascular:  scattered varicosities present bilaterally.  Moderate venous stasis changes to the legs bilaterally.  2+ soft pitting edema. CEAP C4sEpAsPr   Vessel Right Left  Radial Palpable Palpable  Gastrointestinal: soft, non-distended. No guarding/no peritoneal signs.  Musculoskeletal: M/S 5/5 throughout.  No deformity.  Neurologic: CN 2-12 intact. Pain and light touch intact in extremities.  Symmetrical.  Speech is fluent. Motor  exam as listed above. Psychiatric: Judgment intact, Mood & affect appropriate for pt's clinical situation. Dermatologic: Venous rashes no ulcers noted.  No changes consistent with cellulitis. Lymph : No lichenification or skin changes of chronic lymphedema.  CBC Lab Results  Component Value Date   WBC 9.6 02/07/2020   HGB 8.0 (L) 02/07/2020   HCT 25.7 (L) 02/07/2020   MCV 86.5 02/07/2020   PLT 224 02/07/2020    BMET    Component Value Date/Time   NA 137 02/07/2020 0543   NA 136 05/27/2012 0917   K 3.4 (L) 02/07/2020 0543   K 4.0 05/27/2012 0917   CL 104 02/07/2020 0543   CL 102 05/27/2012 0917   CO2 24 02/07/2020 0543   CO2 28 05/27/2012 0917   GLUCOSE 155 (H) 02/07/2020 0543   GLUCOSE 140 (H) 05/27/2012 0917   BUN 22 02/07/2020 0543   BUN 13 05/27/2012 0917   CREATININE 0.67 02/07/2020 0543   CREATININE 0.58 (L) 05/27/2012 0917   CALCIUM 8.7 (L) 02/07/2020 0543   CALCIUM 9.3 05/27/2012 0917   GFRNONAA >60 02/07/2020 0543   GFRNONAA >60 05/27/2012 0917   GFRAA >60 08/27/2019 0718   GFRAA >60 05/27/2012 0917   CrCl cannot be calculated (Patient's most recent lab result is older than the maximum 21 days allowed.).  COAG Lab Results  Component Value Date   INR 1.3 (H) 02/02/2020   INR 1.2 08/23/2019   INR 1.08 07/19/2016    Radiology No results found.   Assessment/Plan There are no diagnoses linked to this encounter.   Levora Dredge, MD  04/23/2023 9:44 AM

## 2023-04-25 ENCOUNTER — Ambulatory Visit (INDEPENDENT_AMBULATORY_CARE_PROVIDER_SITE_OTHER): Admitting: Vascular Surgery

## 2023-04-25 DIAGNOSIS — J439 Emphysema, unspecified: Secondary | ICD-10-CM

## 2023-04-25 DIAGNOSIS — I4819 Other persistent atrial fibrillation: Secondary | ICD-10-CM

## 2023-04-25 DIAGNOSIS — I10 Essential (primary) hypertension: Secondary | ICD-10-CM

## 2023-04-25 DIAGNOSIS — I872 Venous insufficiency (chronic) (peripheral): Secondary | ICD-10-CM

## 2023-04-25 DIAGNOSIS — L97909 Non-pressure chronic ulcer of unspecified part of unspecified lower leg with unspecified severity: Secondary | ICD-10-CM

## 2023-04-29 NOTE — Progress Notes (Signed)
 MRN : [id number]  Bethany Joseph is a 78 y.o. ([birth year]) female who presents with chief complaint of legs hurt and swell.  History of Present Illness:  Patient is seen for follow up evaluation of leg pain and swelling associated with venous ulceration. The patient was recently seen here and started on Unna boot therapy.  The swelling abruptly became much worse bilaterally and is associated with pain and discoloration. The pain and swelling worsens with prolonged dependency and improves with elevation.  The patient notes that in the morning the legs are better but the leg symptoms worsened throughout the course of the day. The patient has also noted a progressive worsening of the discoloration in the ankle and shin area.   The patient notes that an ulcer has developed acutely without specific trauma and since it occurred it has been very slow to heal.  There is a moderate amount of drainage associated with the open area.  The wound is also painful.  The patient states that they have been elevating as much as possible. The patient denies any recent changes in medications.  The patient denies a history of DVT or PE. There is no prior history of phlebitis. There is no history of primary lymphedema.  No SOB or increased cough.  No sputum production.  No recent episodes of CHF exacerbation.   No outpatient medications have been marked as taking for the 05/02/23 encounter (Appointment) with Gilda Crease, Latina Craver, MD.    Past Medical History:  Diagnosis Date   Acquired hypothyroidism    Diagnosed with peripheral resistance   Allergic rhinitis    Aortic valve disorder    Arrhythmia    Arthritis    Asthma    Asthma without status asthmaticus    unspecified   At risk for falls    uses cane, arthritis   Atrial fibrillation (HCC)    Permanent at this time CHADSVASc=3 (age 83, DM, female) 05/30/09 - Afib at 107 4/25 A fib 88 with better rate control on Multaq, but dc Multaq since  in permanent A Fib, refuses Coumadin therapy    Avascular necrosis (HCC)    left foot   Diabetes (HCC)    Diabetic neuropathy (HCC)    unspecified   Difficult intubation    has a difficult intubation card   Fracture of distal femur (HCC)    left periprosthetic, status post ORIF   GERD (gastroesophageal reflux disease)    Gout    Heart murmur    History of atrial fibrillation    History of chicken pox    Humerus distal fracture    left - in[place] on 05/21/12   Hyperkeratosis    Pre-ulcerative hyperkeratotic lesions first MTPJs   Hyperlipidemia    Hypertension    Hyperthyroidism    Mycotic toenails    Osteoarthritis    Osteoporosis, post-menopausal    Status post bilateral knee replacement. DEXA 09/2009: t=-1.6 spine, t=-3.1 right fem neck   Paroxysmal tachycardia (HCC)    now in chronic A fib but rate is well controlled   Type II diabetes mellitus (HCC)     Past Surgical History:  Procedure Laterality Date   CHOLECYSTECTOMY     ENDOSCOPIC RETROGRADE CHOLANGIOPANCREATOGRAPHY (ERCP) WITH PROPOFOL N/A 08/25/2019   Procedure: ENDOSCOPIC RETROGRADE CHOLANGIOPANCREATOGRAPHY (ERCP) WITH PROPOFOL;  Surgeon: Midge Minium, MD;  Location: [facility] ENDOSCOPY;  Service: Endoscopy;  Laterality: N/A;   ERCP N/A 11/03/2019   Procedure: ENDOSCOPIC RETROGRADE CHOLANGIOPANCREATOGRAPHY (ERCP);  Surgeon: Midge Minium, MD;  Location[facility] ENDOSCOPY;  Service: Endoscopy;  Laterality: N/A;   FEMUR FRACTURE SURGERY Left 2010   INTRAMEDULLARY (IM) NAIL INTERTROCHANTERIC Right 07/15/2017   Procedure: INTRAMEDULLARY (IM) NAIL INTERTROCHANTRIC;  Surgeon: Kennedy Bucker, MD;  Location: [facility] ORS;  Service: Orthopedics;  Laterality: Right;   JOINT REPLACEMENT  2007   knee replacement surgeries   ORIF FIBULA FRACTURE     ORIF HUMERUS FRACTURE Left 05/28/2012   supracondylar distal   REPLACEMENT TOTAL KNEE BILATERAL     THYROIDECTOMY  1975   [place]   TOTAL ABDOMINAL HYSTERECTOMY W/ BILATERAL SALPINGOOPHORECTOMY  1983    [place]   TOTAL KNEE ARTHROPLASTY Bilateral     Social History Social History   Tobacco Use   Smoking status: Former    Current packs/day: 0.00    Types: Cigarettes    Quit date: 02/13/1979    Years since quitting: 44.2   Smokeless tobacco: Never  Vaping Use   Vaping status: Never Used  Substance Use Topics   Alcohol use: Yes    Alcohol/week: 14.0 standard drinks of alcohol    Types: 14 Glasses of wine per week   Drug use: No    Family History Family History  Problem Relation Age of Onset   Colon cancer Mother    Osteoporosis Mother    Arthritis Mother    Early death Mother    Diabetes Father    Hypertension Father    Heart attack Father    Coronary artery disease Father    Early death Father    Heart disease Father    Hyperlipidemia Father    Depression Brother    Diabetes Brother    Mental illness Brother    Asthma Maternal Grandfather    COPD Maternal Grandfather    Asthma Maternal Aunt    COPD Maternal Aunt    Cancer Maternal Aunt     Allergies  Allergen Reactions   Neosporin [Neomycin-Polymyxin-Gramicidin] Hives   Tape     Other reaction(s): Unknown Adhesive bandage   Codeine Rash   Latex Rash   Neomycin-Bacitracin Zn-Polymyx Rash    Other reaction(s): Unknown DERMATOLOGICALS    Penicillins Other (See Comments), Rash and Nausea Only    Has patient had a PCN reaction causing immediate rash, facial/tongue/throat swelling, SOB or lightheadedness with hypotension: Unknown  Has patient had a PCN reaction causing severe rash involving mucus membranes or skin necrosis: Unknown  Has patient had a PCN reaction that required hospitalization: Unknown  Has patient had a PCN reaction occurring within the last 10 years: No  If all of the above answers are "NO", then may proceed with Cephalosporin use.   Silicone Rash    bandages     REVIEW OF SYSTEMS (Negative unless checked)  Constitutional: [] Weight loss  [] Fever  [] Chills Cardiac: [] Chest pain    [] Chest pressure   [] Palpitations   [] Shortness of breath when laying flat   [] Shortness of breath with exertion. Vascular:  [] Pain in legs with walking   [x] Pain in legs at rest  [] History of DVT   [] Phlebitis   [x] Swelling in legs   [] Varicose veins   [] Non-healing ulcers Pulmonary:   [] Uses home oxygen   [] Productive cough   [] Hemoptysis   [] Wheeze  [x] COPD   [] Asthma Neurologic:  [] Dizziness   [] Seizures   [] History of stroke   [] History of TIA  [] Aphasia   [] Vissual changes   [] Weakness or numbness in arm   [] Weakness or numbness in leg  Musculoskeletal:   [] Joint swelling   [x] Joint pain   [] Low back pain Hematologic:  [] Easy bruising  [] Easy bleeding   [] Hypercoagulable state   [] Anemic Gastrointestinal:  [] Diarrhea   [] Vomiting  [] Gastroesophageal reflux/heartburn   [] Difficulty swallowing. Genitourinary:  [] Chronic kidney disease   [] Difficult urination  [] Frequent urination   [] Blood in urine Skin:  [] Rashes   [] Ulcers  Psychological:  [] History of anxiety   []  History of major depression.  Physical Examination  There were no vitals filed for this visit. There is no height or weight on file to calculate BMI. Gen: WD/WN, NAD Head: /AT, No temporalis wasting.  Ear/Nose/Throat: Hearing grossly intact, nares w/o erythema or drainage, pinna without lesions Eyes: PER, EOMI, sclera nonicteric.  Neck: Supple, no gross masses.  No JVD.  Pulmonary:  Good air movement, no audible wheezing, no use of accessory muscles.  Cardiac: RRR, precordium not hyperdynamic. Vascular:  scattered varicosities present bilaterally.  She is draining fluid from a small area of the right shin.  Severe venous stasis changes to the legs bilaterally.  2+ soft pitting edema. CEAP C4sEpAsPr   Vessel Right Left  Radial Palpable Palpable  Gastrointestinal: soft, non-distended. No guarding/no peritoneal signs.  Musculoskeletal: M/S 5/5 throughout.  No deformity.  Neurologic: CN 2-12 intact. Pain and light touch  intact in extremities.  Symmetrical.  Speech is fluent. Motor exam as listed above. Psychiatric: Judgment intact, Mood & affect appropriate for pt's clinical situation. Dermatologic: Venous rashes + ulcers noted.  No changes consistent with cellulitis. Lymph : No lichenification or skin changes of chronic lymphedema.  CBC Lab Results  Component Value Date   WBC 9.6 02/07/2020   HGB 8.0 (L) 02/07/2020   HCT 25.7 (L) 02/07/2020   MCV 86.5 02/07/2020   PLT 224 02/07/2020    BMET    Component Value Date/Time   NA 137 02/07/2020 0543   NA 136 05/27/2012 0917   K 3.4 (L) 02/07/2020 0543   K 4.0 05/27/2012 0917   CL 104 02/07/2020 0543   CL 102 05/27/2012 0917   CO2 24 02/07/2020 0543   CO2 28 05/27/2012 0917   GLUCOSE 155 (H) 02/07/2020 0543   GLUCOSE 140 (H) 05/27/2012 0917   BUN 22 02/07/2020 0543   BUN 13 05/27/2012 0917   CREATININE 0.67 02/07/2020 0543   CREATININE 0.58 (L) 05/27/2012 0917   CALCIUM 8.7 (L) 02/07/2020 0543   CALCIUM 9.3 05/27/2012 0917   GFRNONAA >60 02/07/2020 0543   GFRNONAA >60 05/27/2012 0917   GFRAA >60 08/27/2019 0718   GFRAA >60 05/27/2012 0917   CrCl cannot be calculated (Patient's most recent lab result is older than the maximum 21 days allowed.).  COAG Lab Results  Component Value Date   INR 1.3 (H) 02/02/2020   INR 1.2 08/23/2019   INR 1.08 07/19/2016    Radiology No results found.   Assessment/Plan 1. Venous ulcer (HCC) (Primary) No surgery or intervention at this point in time.    I have had a long discussion with the patient regarding venous insufficiency and why it  causes symptoms, specifically venous ulceration. I have discussed with the patient the chronic skin changes that accompany venous insufficiency and the long term sequela such as infection and recurring  ulceration.  Patient will be placed in [employer] which will be changed weekly drainage permitting.  Once she is healed we have discussed increasing her compression  to 30-40 whether this be with layering of multiple stockings or actually using a  30 to 40 mmHg garment.  She will investigate as to what possibilities will work for her.  In addition, behavioral modification including several periods of elevation of the lower extremities during the day will be continued. Achieving a position with the ankles at heart level was stressed to the patient  The patient is instructed to begin routine exercise, especially walking on a daily basis  In the future the patient can be assessed for graduated compression stockings or wraps as well as a Lymph Pump once the ulcers are healed.  2. Chronic venous insufficiency No surgery or intervention at this point in time.    I have had a long discussion with the patient regarding venous insufficiency and why it  causes symptoms, specifically venous ulceration. I have discussed with the patient the chronic skin changes that accompany venous insufficiency and the long term sequela such as infection and recurring  ulceration.  Patient will be placed in [employer] which will be changed weekly drainage permitting.  Once she is healed we have discussed increasing her compression to 30-40 whether this be with layering of multiple stockings or actually using a 30 to 40 mmHg garment.  She will investigate as to what possibilities will work for her.  In addition, behavioral modification including several periods of elevation of the lower extremities during the day will be continued. Achieving a position with the ankles at heart level was stressed to the patient  The patient is instructed to begin routine exercise, especially walking on a daily basis  In the future the patient can be assessed for graduated compression stockings or wraps as well as a Lymph Pump once the ulcers are healed.  3. Essential hypertension Continue antihypertensive medications as already ordered, these medications have been reviewed and there are no changes at  this time.  4. Persistent atrial fibrillation (HCC) Continue antiarrhythmia medications as already ordered, these medications have been reviewed and there are no changes at this time.  Continue anticoagulation as ordered by Cardiology Service  5. Pulmonary emphysema, unspecified emphysema type (HCC) Continue pulmonary medications and aerosols as already ordered, these medications have been reviewed and there are no changes at this time.     Levora Dredge, MD  04/29/2023 10:01 AM

## 2023-05-02 ENCOUNTER — Encounter (INDEPENDENT_AMBULATORY_CARE_PROVIDER_SITE_OTHER): Payer: Self-pay | Admitting: Vascular Surgery

## 2023-05-02 ENCOUNTER — Ambulatory Visit (INDEPENDENT_AMBULATORY_CARE_PROVIDER_SITE_OTHER): Admitting: Vascular Surgery

## 2023-05-02 VITALS — BP 139/67 | HR 100 | Resp 16

## 2023-05-02 DIAGNOSIS — I83009 Varicose veins of unspecified lower extremity with ulcer of unspecified site: Secondary | ICD-10-CM | POA: Diagnosis not present

## 2023-05-02 DIAGNOSIS — I872 Venous insufficiency (chronic) (peripheral): Secondary | ICD-10-CM | POA: Diagnosis not present

## 2023-05-02 DIAGNOSIS — I4819 Other persistent atrial fibrillation: Secondary | ICD-10-CM

## 2023-05-02 DIAGNOSIS — L97909 Non-pressure chronic ulcer of unspecified part of unspecified lower leg with unspecified severity: Secondary | ICD-10-CM

## 2023-05-02 DIAGNOSIS — I10 Essential (primary) hypertension: Secondary | ICD-10-CM

## 2023-05-02 DIAGNOSIS — J439 Emphysema, unspecified: Secondary | ICD-10-CM

## 2023-05-12 ENCOUNTER — Encounter (INDEPENDENT_AMBULATORY_CARE_PROVIDER_SITE_OTHER): Payer: Self-pay | Admitting: Vascular Surgery

## 2023-05-13 ENCOUNTER — Telehealth (INDEPENDENT_AMBULATORY_CARE_PROVIDER_SITE_OTHER): Payer: Self-pay

## 2023-05-13 NOTE — Telephone Encounter (Signed)
 Patient spouse left a message stating that his wife is having some oozing from the ankle. Patient was informed that front receptionist will contact her to be scheduled. Please contact the patient to schedule for unna wrap.

## 2023-05-15 ENCOUNTER — Ambulatory Visit (INDEPENDENT_AMBULATORY_CARE_PROVIDER_SITE_OTHER): Admitting: Nurse Practitioner

## 2023-05-15 ENCOUNTER — Encounter (INDEPENDENT_AMBULATORY_CARE_PROVIDER_SITE_OTHER): Payer: Self-pay

## 2023-05-15 VITALS — BP 126/78 | HR 62 | Resp 16

## 2023-05-15 DIAGNOSIS — I83013 Varicose veins of right lower extremity with ulcer of ankle: Secondary | ICD-10-CM | POA: Diagnosis not present

## 2023-05-15 DIAGNOSIS — I83003 Varicose veins of unspecified lower extremity with ulcer of ankle: Secondary | ICD-10-CM

## 2023-05-15 DIAGNOSIS — L97301 Non-pressure chronic ulcer of unspecified ankle limited to breakdown of skin: Secondary | ICD-10-CM

## 2023-05-15 NOTE — Progress Notes (Signed)
 History of Present Illness  There is no documented history at this time  Assessments & Plan   There are no diagnoses linked to this encounter.    Additional instructions  Subjective:  Patient presents with venous ulcer of the Right lower extremity.    Procedure:  3 layer unna wrap was placed Right lower extremity.   Plan:   Follow up in one week.

## 2023-05-22 ENCOUNTER — Encounter (INDEPENDENT_AMBULATORY_CARE_PROVIDER_SITE_OTHER): Payer: Self-pay

## 2023-05-22 ENCOUNTER — Ambulatory Visit (INDEPENDENT_AMBULATORY_CARE_PROVIDER_SITE_OTHER): Admitting: Nurse Practitioner

## 2023-05-22 VITALS — BP 115/75 | HR 70 | Resp 16

## 2023-05-22 DIAGNOSIS — I83003 Varicose veins of unspecified lower extremity with ulcer of ankle: Secondary | ICD-10-CM

## 2023-05-22 DIAGNOSIS — L97311 Non-pressure chronic ulcer of right ankle limited to breakdown of skin: Secondary | ICD-10-CM | POA: Diagnosis not present

## 2023-05-22 NOTE — Progress Notes (Signed)
 History of Present Illness  There is no documented history at this time  Assessments & Plan   There are no diagnoses linked to this encounter.    Additional instructions  Subjective:  Patient presents with venous ulcer of the Right lower extremity.    Procedure:  3 layer unna wrap was placed Right lower extremity.   Plan:   Follow up in one week.

## 2023-05-29 ENCOUNTER — Encounter (INDEPENDENT_AMBULATORY_CARE_PROVIDER_SITE_OTHER): Payer: Self-pay

## 2023-05-29 ENCOUNTER — Ambulatory Visit (INDEPENDENT_AMBULATORY_CARE_PROVIDER_SITE_OTHER): Admitting: Nurse Practitioner

## 2023-05-29 VITALS — BP 102/66 | HR 88 | Resp 16

## 2023-05-29 DIAGNOSIS — I83013 Varicose veins of right lower extremity with ulcer of ankle: Secondary | ICD-10-CM | POA: Diagnosis not present

## 2023-05-29 DIAGNOSIS — L97301 Non-pressure chronic ulcer of unspecified ankle limited to breakdown of skin: Secondary | ICD-10-CM

## 2023-05-29 DIAGNOSIS — I83003 Varicose veins of unspecified lower extremity with ulcer of ankle: Secondary | ICD-10-CM

## 2023-05-29 NOTE — Progress Notes (Unsigned)
 History of Present Illness  There is no documented history at this time  Assessments & Plan   There are no diagnoses linked to this encounter.    Additional instructions  Subjective:  Patient presents with venous ulcer of the Right lower extremity.    Procedure:  3 layer unna wrap was placed Right lower extremity.   Plan:   Follow up in one week.

## 2023-05-30 ENCOUNTER — Encounter (INDEPENDENT_AMBULATORY_CARE_PROVIDER_SITE_OTHER): Payer: Self-pay | Admitting: Nurse Practitioner

## 2023-05-30 ENCOUNTER — Ambulatory Visit (INDEPENDENT_AMBULATORY_CARE_PROVIDER_SITE_OTHER): Admitting: Vascular Surgery

## 2023-06-03 ENCOUNTER — Encounter (INDEPENDENT_AMBULATORY_CARE_PROVIDER_SITE_OTHER): Payer: Self-pay | Admitting: Vascular Surgery

## 2023-06-03 ENCOUNTER — Ambulatory Visit (INDEPENDENT_AMBULATORY_CARE_PROVIDER_SITE_OTHER): Admitting: Vascular Surgery

## 2023-06-03 VITALS — BP 132/66 | HR 71 | Resp 16

## 2023-06-03 DIAGNOSIS — I890 Lymphedema, not elsewhere classified: Secondary | ICD-10-CM

## 2023-06-03 DIAGNOSIS — I10 Essential (primary) hypertension: Secondary | ICD-10-CM | POA: Diagnosis not present

## 2023-06-03 DIAGNOSIS — I4819 Other persistent atrial fibrillation: Secondary | ICD-10-CM | POA: Diagnosis not present

## 2023-06-03 DIAGNOSIS — I872 Venous insufficiency (chronic) (peripheral): Secondary | ICD-10-CM | POA: Diagnosis not present

## 2023-06-03 DIAGNOSIS — J439 Emphysema, unspecified: Secondary | ICD-10-CM

## 2023-06-08 ENCOUNTER — Encounter (INDEPENDENT_AMBULATORY_CARE_PROVIDER_SITE_OTHER): Payer: Self-pay | Admitting: Vascular Surgery

## 2023-06-08 NOTE — Progress Notes (Signed)
 MRN : [id number]  Bethany Joseph is a 78 y.o. ([birth year]) female who presents with chief complaint of legs swell.  History of Present Illness:   The patient returns to the office for followup evaluation regarding leg swelling.  She has been using Radio broadcast assistant therapy the swelling has improved quite a bit and the pain associated with swelling has decreased substantially.  Her ulcer is now healed and there have not been any interval development of a ulcerations or wounds.  Since the previous visit the patient has been wearing graduated compression stockings and has noted some improvement in the lymphedema. The patient has been using compression routinely morning until night.  The patient also states elevation during the day and exercise (such as walking) is being done too.    Current Meds  Medication Sig   acetaminophen  (TYLENOL ) 325 MG tablet Take 650 mg by mouth every 4 (four) hours as needed.   albuterol  (PROVENTIL ) (2.5 MG/3ML) 0.083% nebulizer solution Inhale into the lungs.   albuterol  (VENTOLIN  HFA) 108 (90 Base) MCG/ACT inhaler    Bacillus Coagulans-Inulin (ALIGN PREBIOTIC-PROBIOTIC PO) Take 1 capsule by mouth daily.   bacitracin  ointment Apply to leg ulcer bid and cover with gauze   bisacodyl  (DULCOLAX) 10 MG suppository Place 10 mg rectally as needed for moderate constipation.   Cholecalciferol 4000 units CAPS Take 1 capsule by mouth daily.   doxepin  (SINEQUAN ) 10 MG capsule Take 10 mg by mouth at bedtime.   doxepin  (SINEQUAN ) 10 MG capsule Take by mouth.   ELIQUIS  5 MG TABS tablet Take 5 mg by mouth every 12 (twelve) hours.   FARXIGA 10 MG TABS tablet Take 1 tablet by mouth daily.   ferrous sulfate 325 (65 FE) MG EC tablet Take by mouth.   gabapentin  (NEURONTIN ) 100 MG capsule Take 100 mg by mouth daily.   gabapentin  (NEURONTIN ) 400 MG capsule Take 400 mg by mouth at bedtime.   HYDROcodone -acetaminophen  (NORCO/VICODIN) 5-325 MG tablet Take  1 tablet by mouth every 4 (four) hours as needed.   Lactobacillus (FLORAJEN ACIDOPHILUS PO) Take by mouth.   levothyroxine  (SYNTHROID , LEVOTHROID) 25 MCG tablet Take 50 mcg by mouth daily before breakfast.   liothyronine  (CYTOMEL ) 25 MCG tablet Take 75 mcg by mouth daily. 4 tabs   loratadine  (CLARITIN ) 10 MG tablet Take 10 mg by mouth daily.   losartan  (COZAAR ) 50 MG tablet Take 50 mg by mouth daily.   Melatonin 10 MG TABS Take 1 tablet by mouth at bedtime as needed.   metFORMIN  (GLUCOPHAGE -XR) 500 MG 24 hr tablet Take 1,000 mg by mouth every evening.   nystatin (MYCOSTATIN) 100000 UNIT/ML suspension    Omega 3 1200 MG CAPS Take 1,200 mg by mouth daily.   omeprazole (PRILOSEC) 40 MG capsule TAKE ONE CAPSULE BY MOUTH DAILY   potassium chloride  SA (KLOR-CON ) 20 MEQ tablet Take 20 mEq by mouth daily.   pravastatin  (PRAVACHOL ) 20 MG tablet Take 20 mg by mouth.   raloxifene  (EVISTA ) 60 MG tablet TAKE ONE (1) TABLET BY MOUTH EVERY DAY   sennosides-docusate sodium  (SENOKOT-S) 8.6-50 MG tablet Take 2 tablets by mouth 2 (two) times daily.   simethicone  (MYLICON) 80 MG chewable tablet Chew 1 tablet (80 mg total) by mouth every 6 (six) hours as needed for flatulence.   torsemide (DEMADEX) 20 MG tablet Take 40  mg by mouth 2 (two) times daily.   vitamin E  1000 UNIT capsule Take by mouth.   Vitamin E  670 MG (1000 UT) CAPS Take by mouth.   zolpidem  (AMBIEN ) 5 MG tablet Take 5 mg by mouth at bedtime as needed for sleep.    Past Medical History:  Diagnosis Date   Acquired hypothyroidism    Diagnosed with peripheral resistance   Allergic rhinitis    Aortic valve disorder    Arrhythmia    Arthritis    Asthma    Asthma without status asthmaticus    unspecified   At risk for falls    uses cane, arthritis   Atrial fibrillation (HCC)    Permanent at this time CHADSVASc=3 (age 58, DM, female) 05/30/09 - Afib at 107 4/25 A fib 88 with better rate control on Multaq, but dc Multaq since in permanent A Fib,  refuses Coumadin therapy    Avascular necrosis (HCC)    left foot   Diabetes (HCC)    Diabetic neuropathy (HCC)    unspecified   Difficult intubation    has a difficult intubation card   Fracture of distal femur (HCC)    left periprosthetic, status post ORIF   GERD (gastroesophageal reflux disease)    Gout    Heart murmur    History of atrial fibrillation    History of chicken pox    Humerus distal fracture    left - [place] on 05/21/12   Hyperkeratosis    Pre-ulcerative hyperkeratotic lesions first MTPJs   Hyperlipidemia    Hypertension    Hyperthyroidism    Mycotic toenails    Osteoarthritis    Osteoporosis, post-menopausal    Status post bilateral knee replacement. DEXA 09/2009: t=-1.6 spine, t=-3.1 right fem neck   Paroxysmal tachycardia (HCC)    now in chronic A fib but rate is well controlled   Type II diabetes mellitus (HCC)     Past Surgical History:  Procedure Laterality Date   CHOLECYSTECTOMY     ENDOSCOPIC RETROGRADE CHOLANGIOPANCREATOGRAPHY (ERCP) WITH PROPOFOL  N/A 08/25/2019   Procedure: ENDOSCOPIC RETROGRADE CHOLANGIOPANCREATOGRAPHY (ERCP) WITH PROPOFOL ;  Surgeon: Marnee Sink, MD;  Location: [facility] ENDOSCOPY;  Service: Endoscopy;  Laterality: N/A;   ERCP N/A 11/03/2019   Procedure: ENDOSCOPIC RETROGRADE CHOLANGIOPANCREATOGRAPHY (ERCP);  Surgeon: Marnee Sink, MD;  Location: [facility] ENDOSCOPY;  Service: Endoscopy;  Laterality: N/A;   FEMUR FRACTURE SURGERY Left 2010   INTRAMEDULLARY (IM) NAIL INTERTROCHANTERIC Right 07/15/2017   Procedure: INTRAMEDULLARY (IM) NAIL INTERTROCHANTRIC;  Surgeon: Molli Angelucci, MD;  Location: [facility] ORS;  Service: Orthopedics;  Laterality: Right;   JOINT REPLACEMENT  2007   knee replacement surgeries   ORIF FIBULA FRACTURE     ORIF HUMERUS FRACTURE Left 05/28/2012   supracondylar distal   REPLACEMENT TOTAL KNEE BILATERAL     THYROIDECTOMY  1975   [place]    TOTAL ABDOMINAL HYSTERECTOMY W/ BILATERAL SALPINGOOPHORECTOMY  1983   [place]    TOTAL KNEE  ARTHROPLASTY Bilateral     Social History Social History   Tobacco Use   Smoking status: Former    Current packs/day: 0.00    Types: Cigarettes    Quit date: 02/13/1979    Years since quitting: 44.3   Smokeless tobacco: Never  Vaping Use   Vaping status: Never Used  Substance Use Topics   Alcohol use: Yes    Alcohol/week: 14.0 standard drinks of alcohol    Types: 14 Glasses of wine per week   Drug use: No    Family History  Family History  Problem Relation Age of Onset   Colon cancer Mother    Osteoporosis Mother    Arthritis Mother    Early death Mother    Diabetes Father    Hypertension Father    Heart attack Father    Coronary artery disease Father    Early death Father    Heart disease Father    Hyperlipidemia Father    Depression Brother    Diabetes Brother    Mental illness Brother    Asthma Maternal Grandfather    COPD Maternal Grandfather    Asthma Maternal Aunt    COPD Maternal Aunt    Cancer Maternal Aunt     Allergies  Allergen Reactions   Neosporin [Neomycin-Polymyxin-Gramicidin] Hives   Tape     Other reaction(s): Unknown Adhesive bandage   Codeine Rash   Latex Rash   Neomycin-Bacitracin  Zn-Polymyx Rash    Other reaction(s): Unknown DERMATOLOGICALS    Penicillins Other (See Comments), Rash and Nausea Only    Has patient had a PCN reaction causing immediate rash, facial/tongue/throat swelling, SOB or lightheadedness with hypotension: Unknown  Has patient had a PCN reaction causing severe rash involving mucus membranes or skin necrosis: Unknown  Has patient had a PCN reaction that required hospitalization: Unknown  Has patient had a PCN reaction occurring within the last 10 years: No  If all of the above answers are "NO", then may proceed with Cephalosporin use.   Silicone Rash    bandages     REVIEW OF SYSTEMS (Negative unless checked)  Constitutional: [] Weight loss  [] Fever  [] Chills Cardiac: [] Chest pain   [] Chest pressure    [] Palpitations   [] Shortness of breath when laying flat   [] Shortness of breath with exertion. Vascular:  [] Pain in legs with walking   [x] Pain in legs with standing  [] History of DVT   [] Phlebitis   [x] Swelling in legs   [] Varicose veins   [] Non-healing ulcers Pulmonary:   [] Uses home oxygen   [] Productive cough   [] Hemoptysis   [] Wheeze  [] COPD   [] Asthma Neurologic:  [] Dizziness   [] Seizures   [] History of stroke   [] History of TIA  [] Aphasia   [] Vissual changes   [] Weakness or numbness in arm   [] Weakness or numbness in leg Musculoskeletal:   [] Joint swelling   [] Joint pain   [] Low back pain Hematologic:  [] Easy bruising  [] Easy bleeding   [] Hypercoagulable state   [] Anemic Gastrointestinal:  [] Diarrhea   [] Vomiting  [] Gastroesophageal reflux/heartburn   [] Difficulty swallowing. Genitourinary:  [] Chronic kidney disease   [] Difficult urination  [] Frequent urination   [] Blood in urine Skin:  [] Rashes   [] Ulcers  Psychological:  [] History of anxiety   []  History of major depression.  Physical Examination  Vitals:   06/03/23 1422  BP: 132/66  Pulse: 71  Resp: 16   There is no height or weight on file to calculate BMI. Gen: WD/WN, NAD Head: [place]/AT, No temporalis wasting.  Ear/Nose/Throat: Hearing grossly intact, nares w/o erythema or drainage, pinna without lesions Eyes: PER, EOMI, sclera nonicteric.  Neck: Supple, no gross masses.  No JVD.  Pulmonary:  Good air movement, no audible wheezing, no use of accessory muscles.  Cardiac: RRR, precordium not hyperdynamic. Vascular:  scattered varicosities present bilaterally.  Mild venous stasis changes to the legs bilaterally.  2+ soft pitting edema, CEAP C4sEpAsPr ulcer is now healed Vessel Right Left  Radial Palpable Palpable  Gastrointestinal: soft, non-distended. No guarding/no peritoneal signs.  Musculoskeletal: M/S 5/5 throughout.  No deformity.  Neurologic: CN 2-12 intact. Pain and light touch intact in extremities.  Symmetrical.   Speech is fluent. Motor exam as listed above. Psychiatric: Judgment intact, Mood & affect appropriate for pt's clinical situation. Dermatologic: Venous rashes no ulcers noted.  No changes consistent with cellulitis. Lymph : No lichenification or skin changes of chronic lymphedema.  CBC Lab Results  Component Value Date   WBC 9.6 02/07/2020   HGB 8.0 (L) 02/07/2020   HCT 25.7 (L) 02/07/2020   MCV 86.5 02/07/2020   PLT 224 02/07/2020    BMET    Component Value Date/Time   NA 137 02/07/2020 0543   NA 136 05/27/2012 0917   K 3.4 (L) 02/07/2020 0543   K 4.0 05/27/2012 0917   CL 104 02/07/2020 0543   CL 102 05/27/2012 0917   CO2 24 02/07/2020 0543   CO2 28 05/27/2012 0917   GLUCOSE 155 (H) 02/07/2020 0543   GLUCOSE 140 (H) 05/27/2012 0917   BUN 22 02/07/2020 0543   BUN 13 05/27/2012 0917   CREATININE 0.67 02/07/2020 0543   CREATININE 0.58 (L) 05/27/2012 0917   CALCIUM 8.7 (L) 02/07/2020 0543   CALCIUM 9.3 05/27/2012 0917   GFRNONAA >60 02/07/2020 0543   GFRNONAA >60 05/27/2012 0917   GFRAA >60 08/27/2019 0718   GFRAA >60 05/27/2012 0917   CrCl cannot be calculated (Patient's most recent lab result is older than the maximum 21 days allowed.).  COAG Lab Results  Component Value Date   INR 1.3 (H) 02/02/2020   INR 1.2 08/23/2019   INR 1.08 07/19/2016    Radiology No results found.   Assessment/Plan 1. Chronic venous insufficiency (Primary) The patient has healed her wound.  She will stop Unna boots and begin using graduated compression.  We have discussed using a layered system in an attempt to achieve a 30 to 40 mmHg pressure.  2. Lymphedema The patient has healed her wound.  She will stop Unna boots and begin using graduated compression.  We have discussed using a layered system in an attempt to achieve a 30 to 40 mmHg pressure.  3. Essential hypertension Continue antihypertensive medications as already ordered, these medications have been reviewed and there are  no changes at this time.  4. Persistent atrial fibrillation (HCC) Continue antiarrhythmia medications as already ordered, these medications have been reviewed and there are no changes at this time.  Continue anticoagulation as ordered by Cardiology Service  5. Pulmonary emphysema, unspecified emphysema type (HCC) Continue pulmonary medications and aerosols as already ordered, these medications have been reviewed and there are no changes at this time.      Devon Fogo, MD  06/08/2023 4:54 PM

## 2023-06-24 ENCOUNTER — Ambulatory Visit (INDEPENDENT_AMBULATORY_CARE_PROVIDER_SITE_OTHER): Payer: Medicare Other | Admitting: Vascular Surgery

## 2023-07-02 ENCOUNTER — Encounter (INDEPENDENT_AMBULATORY_CARE_PROVIDER_SITE_OTHER): Payer: Self-pay

## 2023-09-05 ENCOUNTER — Ambulatory Visit (INDEPENDENT_AMBULATORY_CARE_PROVIDER_SITE_OTHER): Admitting: Vascular Surgery

## 2023-09-08 NOTE — Progress Notes (Signed)
 MRN : [id number]  Bethany Joseph is a 78 y.o. ([birth year]) female who presents with chief complaint of legs hurt and swell.  History of Present Illness:   Patient is seen for follow up evaluation of leg pain and swelling associated with moderate to severe venous stasis changes.  She feels she has been doing very well since her last office visit.  For the most part she reports that the area of irritation under the zipper has remained healed and her right ankle in general is much improved.  She has been very compliant with her 20-30 graduated compression over a extra 10 mmHg liner which equals a 30-40 system.  She is wearing her stockings on a daily basis.  Incidental change she had been using Thera works lotion on a routine basis and since stopping this has noted a dramatic improvement in her skin   The patient states that she continues to elevate as much as possible. The patient denies any recent changes in medications.   The patient denies a history of DVT or PE. There is no prior history of phlebitis. There is no history of primary lymphedema.   No SOB or increased cough.  No sputum production.  No recent episodes of CHF exacerbation.   No outpatient medications have been marked as taking for the 09/09/23 encounter (Appointment) with Jama, Cordella MATSU, MD.    Past Medical History:  Diagnosis Date   Acquired hypothyroidism    Diagnosed with peripheral resistance   Allergic rhinitis    Aortic valve disorder    Arrhythmia    Arthritis    Asthma    Asthma without status asthmaticus    unspecified   At risk for falls    uses cane, arthritis   Atrial fibrillation (HCC)    Permanent at this time CHADSVASc=3 (age 89, DM, female) 05/30/09 - Afib at 107 4/25 A fib 88 with better rate control on Multaq, but dc Multaq since in permanent A Fib, refuses Coumadin therapy    Avascular necrosis (HCC)    left foot   Diabetes (HCC)    Diabetic neuropathy (HCC)    unspecified    Difficult intubation    has a difficult intubation card   Fracture of distal femur (HCC)    left periprosthetic, status post ORIF   GERD (gastroesophageal reflux disease)    Gout    Heart murmur    History of atrial fibrillation    History of chicken pox    Humerus distal fracture    left - [place] on 05/21/12   Hyperkeratosis    Pre-ulcerative hyperkeratotic lesions first MTPJs   Hyperlipidemia    Hypertension    Hyperthyroidism    Mycotic toenails    Osteoarthritis    Osteoporosis, post-menopausal    Status post bilateral knee replacement. DEXA 09/2009: t=-1.6 spine, t=-3.1 right fem neck   Paroxysmal tachycardia (HCC)    now in chronic A fib but rate is well controlled   Type II diabetes mellitus (HCC)     Past Surgical History:  Procedure Laterality Date   CHOLECYSTECTOMY     ENDOSCOPIC RETROGRADE CHOLANGIOPANCREATOGRAPHY (ERCP) WITH PROPOFOL  N/A 08/25/2019   Procedure: ENDOSCOPIC RETROGRADE CHOLANGIOPANCREATOGRAPHY (ERCP) WITH PROPOFOL ;  Surgeon: Jinny Carmine, MD;  Location: [facility] ENDOSCOPY;  Service: Endoscopy;  Laterality: N/A;   ERCP N/A 11/03/2019   Procedure: ENDOSCOPIC RETROGRADE CHOLANGIOPANCREATOGRAPHY (ERCP);  Surgeon: Jinny Carmine, MD;  Location[facility];  Service: Endoscopy;  Laterality: N/A;  FEMUR FRACTURE SURGERY Left 2010   INTRAMEDULLARY (IM) NAIL INTERTROCHANTERIC Right 07/15/2017   Procedure: INTRAMEDULLARY (IM) NAIL INTERTROCHANTRIC;  Surgeon: Kathlynn Sharper, MD;  Location: [facility] ORS;  Service: Orthopedics;  Laterality: Right;   JOINT REPLACEMENT  2007   knee replacement surgeries   ORIF FIBULA FRACTURE     ORIF HUMERUS FRACTURE Left 05/28/2012   supracondylar distal   REPLACEMENT TOTAL KNEE BILATERAL     THYROIDECTOMY  1975   [place]    TOTAL ABDOMINAL HYSTERECTOMY W/ BILATERAL SALPINGOOPHORECTOMY  1983   [place]    TOTAL KNEE ARTHROPLASTY Bilateral     Social History Social History   Tobacco Use   Smoking status: Former    Current packs/day: 0.00     Types: Cigarettes    Quit date: 02/13/1979    Years since quitting: 44.5   Smokeless tobacco: Never  Vaping Use   Vaping status: Never Used  Substance Use Topics   Alcohol use: Yes    Alcohol/week: 14.0 standard drinks of alcohol    Types: 14 Glasses of wine per week   Drug use: No    Family History Family History  Problem Relation Age of Onset   Colon cancer Mother    Osteoporosis Mother    Arthritis Mother    Early death Mother    Diabetes Father    Hypertension Father    Heart attack Father    Coronary artery disease Father    Early death Father    Heart disease Father    Hyperlipidemia Father    Depression Brother    Diabetes Brother    Mental illness Brother    Asthma Maternal Grandfather    COPD Maternal Grandfather    Asthma Maternal Aunt    COPD Maternal Aunt    Cancer Maternal Aunt     Allergies  Allergen Reactions   Neosporin [Neomycin-Polymyxin-Gramicidin] Hives   Tape     Other reaction(s): Unknown Adhesive bandage   Codeine Rash   Latex Rash   Neomycin-Bacitracin  Zn-Polymyx Rash    Other reaction(s): Unknown DERMATOLOGICALS    Penicillins Other (See Comments), Rash and Nausea Only    Has patient had a PCN reaction causing immediate rash, facial/tongue/throat swelling, SOB or lightheadedness with hypotension: Unknown  Has patient had a PCN reaction causing severe rash involving mucus membranes or skin necrosis: Unknown  Has patient had a PCN reaction that required hospitalization: Unknown  Has patient had a PCN reaction occurring within the last 10 years: No  If all of the above answers are NO, then may proceed with Cephalosporin use.   Silicone Rash    bandages     REVIEW OF SYSTEMS (Negative unless checked)  Constitutional: [] Weight loss  [] Fever  [] Chills Cardiac: [] Chest pain   [] Chest pressure   [] Palpitations   [] Shortness of breath when laying flat   [] Shortness of breath with exertion. Vascular:  [] Pain in legs with walking    [x] Pain in legs at rest  [] History of DVT   [] Phlebitis   [x] Swelling in legs   [] Varicose veins   [] Non-healing ulcers Pulmonary:   [] Uses home oxygen   [] Productive cough   [] Hemoptysis   [] Wheeze  [x] COPD   [] Asthma Neurologic:  [] Dizziness   [] Seizures   [] History of stroke   [] History of TIA  [] Aphasia   [] Vissual changes   [] Weakness or numbness in arm   [] Weakness or numbness in leg Musculoskeletal:   [] Joint swelling   [x] Joint pain   [] Low back pain Hematologic:  []   Easy bruising  [] Easy bleeding   [] Hypercoagulable state   [] Anemic Gastrointestinal:  [] Diarrhea   [] Vomiting  [] Gastroesophageal reflux/heartburn   [] Difficulty swallowing. Genitourinary:  [] Chronic kidney disease   [] Difficult urination  [] Frequent urination   [] Blood in urine Skin:  [] Rashes   [] Ulcers  Psychological:  [] History of anxiety   []  History of major depression.  Physical Examination  There were no vitals filed for this visit. There is no height or weight on file to calculate BMI. Gen: WD/WN, NAD Head: [place]/AT, No temporalis wasting.  Ear/Nose/Throat: Hearing grossly intact, nares w/o erythema or drainage, pinna without lesions Eyes: PER, EOMI, sclera nonicteric.  Neck: Supple, no gross masses.  No JVD.  Pulmonary:  Good air movement, no audible wheezing, no use of accessory muscles.  Cardiac: RRR, precordium not hyperdynamic. Vascular:  scattered varicosities present bilaterally.  Moderate venous stasis changes to the legs bilaterally.  1-2+ soft pitting edema. CEAP C4sEpAsPr   Vessel Right Left  Radial Palpable Palpable  Gastrointestinal: soft, non-distended. No guarding/no peritoneal signs.  Musculoskeletal: M/S 5/5 throughout.  No deformity.  Neurologic: CN 2-12 intact. Pain and light touch intact in extremities.  Symmetrical.  Speech is fluent. Motor exam as listed above. Psychiatric: Judgment intact, Mood & affect appropriate for pt's clinical situation. Dermatologic: Venous rashes no ulcers noted.   No changes consistent with cellulitis. Lymph : No lichenification or skin changes of chronic lymphedema.  CBC Lab Results  Component Value Date   WBC 9.6 02/07/2020   HGB 8.0 (L) 02/07/2020   HCT 25.7 (L) 02/07/2020   MCV 86.5 02/07/2020   PLT 224 02/07/2020    BMET    Component Value Date/Time   NA 137 02/07/2020 0543   NA 136 05/27/2012 0917   K 3.4 (L) 02/07/2020 0543   K 4.0 05/27/2012 0917   CL 104 02/07/2020 0543   CL 102 05/27/2012 0917   CO2 24 02/07/2020 0543   CO2 28 05/27/2012 0917   GLUCOSE 155 (H) 02/07/2020 0543   GLUCOSE 140 (H) 05/27/2012 0917   BUN 22 02/07/2020 0543   BUN 13 05/27/2012 0917   CREATININE 0.67 02/07/2020 0543   CREATININE 0.58 (L) 05/27/2012 0917   CALCIUM 8.7 (L) 02/07/2020 0543   CALCIUM 9.3 05/27/2012 0917   GFRNONAA >60 02/07/2020 0543   GFRNONAA >60 05/27/2012 0917   GFRAA >60 08/27/2019 0718   GFRAA >60 05/27/2012 0917   CrCl cannot be calculated (Patient's most recent lab result is older than the maximum 21 days allowed.).  COAG Lab Results  Component Value Date   INR 1.3 (H) 02/02/2020   INR 1.2 08/23/2019   INR 1.08 07/19/2016    Radiology No results found.   Assessment/Plan 1. 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  It does appear that she was having a chronic allergic reaction to the Thera works lotion.  Since discontinuing this she has noted a significant improvement.  Even given this is an underlying etiology she has also been doing better with the layered compression equaling a 30 to 40 mmHg system and she will continue this.  2. Chronic venous insufficiency At this point in time she has completely healed and her skin overall is much improved.  It does appear that she was having a chronic allergic reaction to the Thera works lotion.  Since  discontinuing  this she has noted a significant improvement.  Even given this is an underlying etiology she has also been doing better with the layered compression equaling a 30 to 40 mmHg system and she will continue this.  3. Essential hypertension Continue antihypertensive medications as already ordered, these medications have been reviewed and there are no changes at this time.  4. Persistent atrial fibrillation (HCC) Continue antiarrhythmia medications as already ordered, these medications have been reviewed and there are no changes at this time.  Continue anticoagulation as ordered by Cardiology Service  5. Pulmonary emphysema, unspecified emphysema type (HCC) Continue pulmonary medications and aerosols as already ordered, these medications have been reviewed and there are no changes at this time.     Cordella Shawl, MD  09/08/2023 4:06 PM

## 2023-09-09 ENCOUNTER — Encounter (INDEPENDENT_AMBULATORY_CARE_PROVIDER_SITE_OTHER): Payer: Self-pay | Admitting: Vascular Surgery

## 2023-09-09 ENCOUNTER — Ambulatory Visit (INDEPENDENT_AMBULATORY_CARE_PROVIDER_SITE_OTHER): Admitting: Vascular Surgery

## 2023-09-09 VITALS — BP 122/69 | HR 80

## 2023-09-09 DIAGNOSIS — I872 Venous insufficiency (chronic) (peripheral): Secondary | ICD-10-CM | POA: Diagnosis not present

## 2023-09-09 DIAGNOSIS — I10 Essential (primary) hypertension: Secondary | ICD-10-CM

## 2023-09-09 DIAGNOSIS — I4819 Other persistent atrial fibrillation: Secondary | ICD-10-CM | POA: Diagnosis not present

## 2023-09-09 DIAGNOSIS — I83003 Varicose veins of unspecified lower extremity with ulcer of ankle: Secondary | ICD-10-CM

## 2023-09-09 DIAGNOSIS — J439 Emphysema, unspecified: Secondary | ICD-10-CM

## 2023-09-09 DIAGNOSIS — L97301 Non-pressure chronic ulcer of unspecified ankle limited to breakdown of skin: Secondary | ICD-10-CM

## 2023-09-14 ENCOUNTER — Encounter (INDEPENDENT_AMBULATORY_CARE_PROVIDER_SITE_OTHER): Payer: Self-pay | Admitting: Vascular Surgery

## 2024-09-07 ENCOUNTER — Ambulatory Visit (INDEPENDENT_AMBULATORY_CARE_PROVIDER_SITE_OTHER): Admitting: Vascular Surgery
